# Patient Record
Sex: Male | Born: 1940 | Race: White | Hispanic: No | Marital: Married | Smoking: Former smoker
Health system: Southern US, Community
[De-identification: ages and names within clinical notes are randomized; demographics above are authoritative.]

## PROBLEM LIST (undated history)

## (undated) DIAGNOSIS — R011 Cardiac murmur, unspecified: Secondary | ICD-10-CM

## (undated) DIAGNOSIS — E119 Type 2 diabetes mellitus without complications: Secondary | ICD-10-CM

## (undated) DIAGNOSIS — J45909 Unspecified asthma, uncomplicated: Secondary | ICD-10-CM

## (undated) DIAGNOSIS — H269 Unspecified cataract: Secondary | ICD-10-CM

## (undated) DIAGNOSIS — E785 Hyperlipidemia, unspecified: Secondary | ICD-10-CM

## (undated) DIAGNOSIS — K219 Gastro-esophageal reflux disease without esophagitis: Secondary | ICD-10-CM

## (undated) DIAGNOSIS — I10 Essential (primary) hypertension: Secondary | ICD-10-CM

## (undated) DIAGNOSIS — E7800 Pure hypercholesterolemia, unspecified: Secondary | ICD-10-CM

## (undated) DIAGNOSIS — M109 Gout, unspecified: Secondary | ICD-10-CM

## (undated) DIAGNOSIS — R0989 Other specified symptoms and signs involving the circulatory and respiratory systems: Secondary | ICD-10-CM

## (undated) DIAGNOSIS — M1990 Unspecified osteoarthritis, unspecified site: Secondary | ICD-10-CM

## (undated) DIAGNOSIS — I350 Nonrheumatic aortic (valve) stenosis: Secondary | ICD-10-CM

## (undated) HISTORY — DX: Gastro-esophageal reflux disease without esophagitis: K21.9

## (undated) HISTORY — DX: Type 2 diabetes mellitus without complications: E11.9

## (undated) HISTORY — PX: EYE SURGERY: SHX253

## (undated) HISTORY — PX: POLYPECTOMY: SHX149

## (undated) HISTORY — DX: Gout, unspecified: M10.9

## (undated) HISTORY — PX: COLONOSCOPY: SHX174

## (undated) HISTORY — DX: Unspecified osteoarthritis, unspecified site: M19.90

## (undated) HISTORY — DX: Pure hypercholesterolemia, unspecified: E78.00

## (undated) HISTORY — DX: Cardiac murmur, unspecified: R01.1

## (undated) HISTORY — DX: Other specified symptoms and signs involving the circulatory and respiratory systems: R09.89

## (undated) HISTORY — PX: CARDIAC CATHETERIZATION: SHX172

## (undated) HISTORY — DX: Unspecified asthma, uncomplicated: J45.909

## (undated) HISTORY — DX: Hyperlipidemia, unspecified: E78.5

## (undated) HISTORY — DX: Nonrheumatic aortic (valve) stenosis: I35.0

## (undated) HISTORY — DX: Unspecified cataract: H26.9

## (undated) HISTORY — DX: Essential (primary) hypertension: I10

## (undated) HISTORY — PX: CATARACT EXTRACTION: SUR2

---

## 2005-05-14 ENCOUNTER — Ambulatory Visit: Payer: Self-pay | Admitting: Gastroenterology

## 2006-11-17 ENCOUNTER — Ambulatory Visit (HOSPITAL_BASED_OUTPATIENT_CLINIC_OR_DEPARTMENT_OTHER): Admission: RE | Admit: 2006-11-17 | Discharge: 2006-11-17 | Payer: Self-pay | Admitting: Family Medicine

## 2006-11-22 ENCOUNTER — Ambulatory Visit: Payer: Self-pay | Admitting: Internal Medicine

## 2007-08-10 ENCOUNTER — Ambulatory Visit: Payer: Self-pay | Admitting: Gastroenterology

## 2007-08-25 ENCOUNTER — Ambulatory Visit: Payer: Self-pay | Admitting: Gastroenterology

## 2007-08-25 ENCOUNTER — Encounter: Payer: Self-pay | Admitting: Gastroenterology

## 2007-08-26 ENCOUNTER — Encounter: Payer: Self-pay | Admitting: Gastroenterology

## 2010-08-07 NOTE — Procedures (Signed)
NAME:  Keith Wagner, Keith Wagner NO.:  [id number]   MEDICAL RECORD NO.:  [id number]          PATIENT TYPE:  OUT   LOCATION:  SLEEP CENTER                 FACILITY:  [facility]   PHYSICIAN:  Clinton D. Maple Hudson, MD, FCCP, FACPDATE OF BIRTH:  07-14-1941   DATE OF STUDY:  11/17/2006                            NOCTURNAL POLYSOMNOGRAM   REFERRING PHYSICIAN:  Marjory Lies, M.D.   INDICATION FOR STUDY:  Hypersomnia with sleep apnea.   EPWORTH SLEEPINESS SCORE:  4/24, BMI 30, weight 187 pounds.   HOME MEDICATIONS:  Listed and reviewed.   SLEEP ARCHITECTURE:  Total sleep time 251 minutes with sleep efficiency  59%.  Stage I was 19%, stage II 70%, stage III absent, REM 11% of total  sleep time.  Sleep latency 19 minutes, REM latency 93 minutes.  Awake  after sleep onset 154 minutes including sustained wakefulness between 2-  3 a.m., nonspecific.  Arousal index 5.  No bedtime medication was taken.   RESPIRATORY DATA:  Apnea/hypopnea index (AHI, RDI) no scorable  respiratory events were noted, AHI 0 per hour.   OXYGEN DATA:  Mild to moderate snoring with oxygen desaturation to a  nadir of 88%.  Mean oxygen saturation through the study was 92% on room  air.   CARDIAC DATA:  Sinus rhythm with 1 interval of paroxysmal  supraventricular tachycardia at 160 beats per minute, spontaneously  resolving and apparently asymptomatic while asleep.   MOVEMENT-PARASOMNIA:  Occasional limb jerk with little effect on sleep.  No bathroom trips.   IMPRESSIONS-RECOMMENDATIONS:  1. Short total sleep time without bedtime medication, 251 minutes,      significant for frequent brief wakings and sustained time awake      between 2-3 a.m., nonspecific.  2. No respiratory disturbance, AHI zero per hour (normal range 0-5 per      hour).  Mild to moderate snoring with oxygen desaturating, nadir      88%.  3. One self limited episode of paroxysmal supraventricular tachycardia      at about 160 beats per minute  while in stage II sleep with oxygen      saturation at 92%.      Clinton D. Maple Hudson, MD, [facility], FACP  Diplomate, Biomedical engineer of Sleep Medicine  Electronically Signed     CDY/MEDQ  D:  11/22/2006 15:40:53  T:  11/23/2006 14:10:02  Job:  [id number]

## 2012-09-10 ENCOUNTER — Encounter: Payer: Self-pay | Admitting: Gastroenterology

## 2012-09-14 ENCOUNTER — Encounter: Payer: Self-pay | Admitting: Gastroenterology

## 2012-11-04 ENCOUNTER — Encounter: Payer: Self-pay | Admitting: Gastroenterology

## 2012-11-04 ENCOUNTER — Ambulatory Visit (AMBULATORY_SURGERY_CENTER): Payer: Medicare Other | Admitting: *Deleted

## 2012-11-04 VITALS — Ht 65.0 in | Wt 176.5 lb

## 2012-11-04 DIAGNOSIS — Z86010 Personal history of colonic polyps: Secondary | ICD-10-CM

## 2012-11-04 MED ORDER — PEG-KCL-NACL-NASULF-NA ASC-C 100 G PO SOLR: ORAL | Status: DC

## 2012-11-04 NOTE — Progress Notes (Signed)
No egg or soy allergy.  Pt is on three medicines for his blood pressure.  For this reason, I gave him Moviprep instead of Prepopik

## 2012-11-18 ENCOUNTER — Ambulatory Visit (AMBULATORY_SURGERY_CENTER): Payer: Medicare Other | Admitting: Gastroenterology

## 2012-11-18 ENCOUNTER — Encounter: Payer: Self-pay | Admitting: Gastroenterology

## 2012-11-18 VITALS — BP 147/89 | HR 58 | Resp 16 | Ht 65.0 in | Wt 176.0 lb

## 2012-11-18 DIAGNOSIS — Z86010 Personal history of colonic polyps: Secondary | ICD-10-CM

## 2012-11-18 MED ORDER — SODIUM CHLORIDE 0.9 % IV SOLN: 500.0000 mL | INTRAVENOUS | Status: DC

## 2012-11-18 NOTE — Patient Instructions (Signed)
YOU HAD AN ENDOSCOPIC PROCEDURE TODAY AT THE [facility]: Refer to the procedure report that was given to you for any specific questions about what was found during the examination.  If the procedure report does not answer your questions, please call your gastroenterologist to clarify.  If you requested that your care partner not be given the details of your procedure findings, then the procedure report has been included in a sealed envelope for you to review at your convenience later.  YOU SHOULD EXPECT: Some feelings of bloating in the abdomen. Passage of more gas than usual.  Walking can help get rid of the air that was put into your GI tract during the procedure and reduce the bloating. If you had a lower endoscopy (such as a colonoscopy or flexible sigmoidoscopy) you may notice spotting of blood in your stool or on the toilet paper. If you underwent a bowel prep for your procedure, then you may not have a normal bowel movement for a few days.  DIET: Your first meal following the procedure should be a light meal and then it is ok to progress to your normal diet.  A half-sandwich or bowl of soup is an example of a good first meal.  Heavy or fried foods are harder to digest and may make you feel nauseous or bloated.  Likewise meals heavy in dairy and vegetables can cause extra gas to form and this can also increase the bloating.  Drink plenty of fluids but you should avoid alcoholic beverages for 24 hours.  ACTIVITY: Your care partner should take you home directly after the procedure.  You should plan to take it easy, moving slowly for the rest of the day.  You can resume normal activity the day after the procedure however you should NOT DRIVE or use heavy machinery for 24 hours (because of the sedation medicines used during the test).    SYMPTOMS TO REPORT IMMEDIATELY: A gastroenterologist can be reached at any hour.  During normal business hours, 8:30 AM to 5:00 PM Monday through Friday,  call [phone].  After hours and on weekends, please call the GI answering service at [phone] who will take a message and have the physician on call contact you.   Following lower endoscopy (colonoscopy or flexible sigmoidoscopy):  Excessive amounts of blood in the stool  Significant tenderness or worsening of abdominal pains  Swelling of the abdomen that is new, acute  Fever of 100F or higher    FOLLOW UP: If any biopsies were taken you will be contacted by phone or by letter within the next 1-3 weeks.  Call your gastroenterologist if you have not heard about the biopsies in 3 weeks.  Our staff will call the home number listed on your records the next business day following your procedure to check on you and address any questions or concerns that you may have at that time regarding the information given to you following your procedure. This is a courtesy call and so if there is no answer at the home number and we have not heard from you through the emergency physician on call, we will assume that you have returned to your regular daily activities without incident.  SIGNATURES/CONFIDENTIALITY: You and/or your care partner have signed paperwork which will be entered into your electronic medical record.  These signatures attest to the fact that that the information above on your After Visit Summary has been reviewed and is understood.  Full responsibility of the confidentiality   of this discharge information lies with you and/or your care-partner.     

## 2012-11-18 NOTE — Progress Notes (Signed)
Patient did not experience any of the following events: a burn prior to discharge; a fall within the facility; wrong site/side/patient/procedure/implant event; or a hospital transfer or hospital admission upon discharge from the facility. (G8907) Patient did not have preoperative order for IV antibiotic SSI prophylaxis. (G8918)  

## 2012-11-18 NOTE — Progress Notes (Signed)
Lidocaine-40mg IV prior to Propofol InductionPropofol given over incremental dosages 

## 2012-11-18 NOTE — Op Note (Signed)
[facility] [address] [place], [address]   COLONOSCOPY PROCEDURE REPORT  PATIENT: Keith Wagner, Keith Wagner  MR#: [id number] BIRTHDATE: Jun 15, 1940 , 72  yrs. old GENDER: Male ENDOSCOPIST: Meryl Dare, MD, [facility] PROCEDURE DATE:  11/18/2012 PROCEDURE:   Colonoscopy, screening First Screening Colonoscopy - Avg.  risk and is 50 yrs.  old or older - No.  Prior Negative Screening - Now for repeat screening. N/A  History of Adenoma - Now for follow-up colonoscopy & has been > or = to 3 yrs.  Yes hx of adenoma.  Has been 3 or more years since last colonoscopy.  Polyps Removed Today? No.  Recommend repeat exam, <10 yrs? Yes.  High risk (family or personal hx). ASA CLASS:   Class II INDICATIONS:Patient's personal history of adenomatous colon polyps.  MEDICATIONS: MAC sedation, administered by CRNA and propofol (Diprivan) 250mg  IV DESCRIPTION OF PROCEDURE:   After the risks benefits and alternatives of the procedure were thoroughly explained, informed consent was obtained.  A digital rectal exam revealed no abnormalities of the rectum.   The LB PFC-H190 [id number]  endoscope was introduced through the anus and advanced to the cecum, which was identified by both the appendix and ileocecal valve. No adverse events experienced.   The quality of the prep was good, using MoviPrep  The instrument was then slowly withdrawn as the colon was fully examined.  COLON FINDINGS: A normal appearing cecum, ileocecal valve, and appendiceal orifice were identified.  The ascending, hepatic flexure, transverse, splenic flexure, descending, sigmoid colon and rectum appeared unremarkable.  No polyps or cancers were seen. Retroflexed views revealed small internal hemorrhoids. The time to cecum=1 minutes 15 seconds.  Withdrawal time=9 minutes 25 seconds. The scope was withdrawn and the procedure completed.  COMPLICATIONS: There were no complications.  ENDOSCOPIC IMPRESSION: 1.  Normal colon 2.   Small internal hemorrhodids  RECOMMENDATIONS: 1.  Repeat Colonoscopy in 5 years.  eSigned:  Meryl Dare, MD, [facility] 11/18/2012 9:39 AM   cc: Marjory Lies, MD

## 2012-11-19 ENCOUNTER — Telehealth: Payer: Self-pay | Admitting: *Deleted

## 2012-11-19 NOTE — Telephone Encounter (Signed)
  Follow up Call-  Call back number 11/18/2012  Post procedure Call Back phone  # [phone]  Permission to leave phone message Yes     Patient questions:  Do you have a fever, pain , or abdominal swelling? no Pain Score  0 *  Have you tolerated food without any problems? yes  Have you been able to return to your normal activities? yes  Do you have any questions about your discharge instructions: Diet   no Medications  no Follow up visit  no  Do you have questions or concerns about your Care? no  Actions: * If pain score is 4 or above: No action needed, pain <4.

## 2014-11-07 DIAGNOSIS — I10 Essential (primary) hypertension: Secondary | ICD-10-CM | POA: Insufficient documentation

## 2015-07-24 ENCOUNTER — Encounter: Payer: Self-pay | Admitting: Gastroenterology

## 2017-09-15 ENCOUNTER — Encounter: Payer: Self-pay | Admitting: Gastroenterology

## 2017-11-14 ENCOUNTER — Encounter: Payer: Self-pay | Admitting: Gastroenterology

## 2017-12-30 ENCOUNTER — Encounter: Payer: Self-pay | Admitting: *Deleted

## 2017-12-30 ENCOUNTER — Ambulatory Visit (AMBULATORY_SURGERY_CENTER): Payer: Self-pay | Admitting: *Deleted

## 2017-12-30 ENCOUNTER — Encounter: Payer: Self-pay | Admitting: Gastroenterology

## 2017-12-30 VITALS — Ht 66.0 in | Wt 185.4 lb

## 2017-12-30 DIAGNOSIS — Z86010 Personal history of colonic polyps: Secondary | ICD-10-CM

## 2017-12-30 MED ORDER — NA SULFATE-K SULFATE-MG SULF 17.5-3.13-1.6 GM/177ML PO SOLN: 1.0000 | Freq: Once | ORAL | 0 refills | Status: AC

## 2017-12-30 NOTE — Progress Notes (Signed)
No egg or soy allergy known to patient  No issues with past sedation with any surgeries  or procedures, no past  Intubation  No diet pills per patient No home 02 use per patient  No blood thinners per patient  Pt denies issues with constipation  No A fib or A flutter  EMMI video sent to pt's e mail - pt has viewed in the past  Wife in [place] with pt today

## 2018-01-13 ENCOUNTER — Encounter: Payer: Self-pay | Admitting: Gastroenterology

## 2018-01-13 ENCOUNTER — Ambulatory Visit (AMBULATORY_SURGERY_CENTER): Payer: Medicare HMO | Admitting: Gastroenterology

## 2018-01-13 VITALS — BP 120/75 | HR 80 | Temp 96.9°F | Resp 14 | Ht 66.0 in | Wt 185.0 lb

## 2018-01-13 DIAGNOSIS — Z86010 Personal history of colonic polyps: Secondary | ICD-10-CM

## 2018-01-13 DIAGNOSIS — K635 Polyp of colon: Secondary | ICD-10-CM | POA: Diagnosis not present

## 2018-01-13 DIAGNOSIS — K6389 Other specified diseases of intestine: Secondary | ICD-10-CM

## 2018-01-13 DIAGNOSIS — D125 Benign neoplasm of sigmoid colon: Secondary | ICD-10-CM

## 2018-01-13 MED ORDER — SODIUM CHLORIDE 0.9 % IV SOLN: 500.0000 mL | Freq: Once | INTRAVENOUS | Status: DC

## 2018-01-13 NOTE — Op Note (Addendum)
[place] Patient Name: Keith Wagner Procedure Date: 01/13/2018 9:12 AM MRN: [id number] Endoscopist: Ladene Artist , MD Age: 77 Referring MD:  Date of Birth: 11/22/1940 Gender: Male Account #: [id number] Procedure:                Colonoscopy Indications:              Surveillance: Personal history of adenomatous                            polyps on last colonoscopy 5 years ago Medicines:                Monitored Anesthesia Care Procedure:                Pre-Anesthesia Assessment:                           - Prior to the procedure, a History and Physical                            was performed, and patient medications and                            allergies were reviewed. The patient's tolerance of                            previous anesthesia was also reviewed. The risks                            and benefits of the procedure and the sedation                            options and risks were discussed with the patient.                            All questions were answered, and informed consent                            was obtained. Prior Anticoagulants: The patient has                            taken no previous anticoagulant or antiplatelet                            agents. ASA Grade Assessment: II - A patient with                            mild systemic disease. After reviewing the risks                            and benefits, the patient was deemed in                            satisfactory condition to undergo the procedure.  After obtaining informed consent, the colonoscope                            was passed under direct vision. Throughout the                            procedure, the patient's blood pressure, pulse, and                            oxygen saturations were monitored continuously. The                            Colonoscope was introduced through the anus and                            advanced to the the cecum,  identified by                            appendiceal orifice and ileocecal valve. The                            ileocecal valve, appendiceal orifice, and rectum                            were photographed. The quality of the bowel                            preparation was excellent. The colonoscopy was                            performed without difficulty. The patient tolerated                            the procedure well. Scope In: 9:16:47 AM Scope Out: 9:27:07 AM Scope Withdrawal Time: 0 hours 9 minutes 5 seconds  Total Procedure Duration: 0 hours 10 minutes 20 seconds  Findings:                 The perianal and digital rectal examinations were                            normal.                           A 8 mm polyp was found in the sigmoid colon. The                            polyp was sessile. The polyp was removed with a                            cold snare. Resection and retrieval were complete.                           Internal hemorrhoids were found during  retroflexion. The hemorrhoids were medium-sized and                            Grade I (internal hemorrhoids that do not prolapse).                           The exam was otherwise without abnormality on                            direct and retroflexion views. Complications:            No immediate complications. Estimated blood loss:                            None. Estimated Blood Loss:     Estimated blood loss: none. Impression:               - One 8 mm polyp in the sigmoid colon, removed with                            a cold snare. Resected and retrieved.                           - Internal hemorrhoids.                           - The examination was otherwise normal on direct                            and retroflexion views. Recommendation:           - Patient has a contact number available for                            emergencies. The signs and symptoms of potential                             delayed complications were discussed with the                            patient. Return to normal activities tomorrow.                            Written discharge instructions were provided to the                            patient.                           - Resume previous diet.                           - Continue present medications.                           - Await pathology results.                           -  No repeat colonoscopy due to age. Ladene Artist, MD 01/13/2018 9:29:19 AM This report has been signed electronically.

## 2018-01-13 NOTE — Progress Notes (Signed)
To PACU, VSS. REport to [email or web address].tb 

## 2018-01-13 NOTE — Progress Notes (Signed)
Pt's states no medical or surgical changes since previsit or office visit. 

## 2018-01-13 NOTE — Patient Instructions (Signed)
HANDOUTS GIVEN FOR POLYPS AND HEMORRHOIDS  YOU HAD AN ENDOSCOPIC PROCEDURE TODAY AT THE [facility]:   Refer to the procedure report that was given to you for any specific questions about what was found during the examination.  If the procedure report does not answer your questions, please call your gastroenterologist to clarify.  If you requested that your care partner not be given the details of your procedure findings, then the procedure report has been included in a sealed envelope for you to review at your convenience later.  YOU SHOULD EXPECT: Some feelings of bloating in the abdomen. Passage of more gas than usual.  Walking can help get rid of the air that was put into your GI tract during the procedure and reduce the bloating. If you had a lower endoscopy (such as a colonoscopy or flexible sigmoidoscopy) you may notice spotting of blood in your stool or on the toilet paper. If you underwent a bowel prep for your procedure, you may not have a normal bowel movement for a few days.  Please Note:  You might notice some irritation and congestion in your nose or some drainage.  This is from the oxygen used during your procedure.  There is no need for concern and it should clear up in a day or so.  SYMPTOMS TO REPORT IMMEDIATELY:   Following lower endoscopy (colonoscopy or flexible sigmoidoscopy):  Excessive amounts of blood in the stool  Significant tenderness or worsening of abdominal pains  Swelling of the abdomen that is new, acute  Fever of 100F or higher   For urgent or emergent issues, a gastroenterologist can be reached at any hour by calling [phone].   DIET:  We do recommend a small meal at first, but then you may proceed to your regular diet.  Drink plenty of fluids but you should avoid alcoholic beverages for 24 hours.  ACTIVITY:  You should plan to take it easy for the rest of today and you should NOT DRIVE or use heavy machinery until tomorrow (because of the  sedation medicines used during the test).    FOLLOW UP: Our staff will call the number listed on your records the next business day following your procedure to check on you and address any questions or concerns that you may have regarding the information given to you following your procedure. If we do not reach you, we will leave a message.  However, if you are feeling well and you are not experiencing any problems, there is no need to return our call.  We will assume that you have returned to your regular daily activities without incident.  If any biopsies were taken you will be contacted by phone or by letter within the next 1-3 weeks.  Please call us at [phone] if you have not heard about the biopsies in 3 weeks.    SIGNATURES/CONFIDENTIALITY: You and/or your care partner have signed paperwork which will be entered into your electronic medical record.  These signatures attest to the fact that that the information above on your After Visit Summary has been reviewed and is understood.  Full responsibility of the confidentiality of this discharge information lies with you and/or your care-partner. 

## 2018-01-13 NOTE — Progress Notes (Signed)
Called to room to assist during endoscopic procedure.  Patient ID and intended procedure confirmed with present staff. Received instructions for my participation in the procedure from the performing physician.  

## 2018-01-14 ENCOUNTER — Telehealth: Payer: Self-pay

## 2018-01-14 NOTE — Telephone Encounter (Signed)
  Follow up Call-  Call back number 01/13/2018  Post procedure Call Back phone  # [phone]  Permission to leave phone message Yes  Some recent data might be hidden     Patient questions:  Do you have a fever, pain , or abdominal swelling? No. Pain Score  0 *  Have you tolerated food without any problems? Yes   Have you been able to return to your normal activities? Yes.    Do you have any questions about your discharge instructions: Diet   No. Medications  No. Follow up visit  No.  Do you have questions or concerns about your Care? No.  Actions: * If pain score is 4 or above: No action needed, pain <4.

## 2018-01-30 ENCOUNTER — Encounter: Payer: Self-pay | Admitting: Gastroenterology

## 2018-05-24 ENCOUNTER — Other Ambulatory Visit: Payer: Self-pay | Admitting: Cardiology

## 2018-06-11 NOTE — Progress Notes (Signed)
Subjective:   Keith Wagner, male    DOB: Nov 08, 1940, 78 y.o.   MRN: [id number]  Keith Wagner., PA-C:  Chief Complaint  Patient presents with  . Hypertension  . Hyperlipidemia  . Follow-up    HPI: Keith Wagner  is a 78 y.o. male  with mild to moderate AR, trace AS, mild MR and mild to moderate TR with mild pulmonary hypertension by echocardiogram in July 2018. Patient also has hypertension with heart disease, diabetes, and hyperlipidemia.  He is here for follow up. Reports blood pressure has been elevated for the last few days. He denies any complaints of chest pain, tightness or pressure. There is no shortness of breath, orthopnea or PND. He has occasional mild swelling in his right foot and right knee from OA. No history of leg claudication.   He has not had any recent episodes of palpitations.  He does not smoke. He does use a stationary bicycle and also walks regularly that he tolerates well. He is also is very active with gardening.  No history of thyroid problems. No history of rheumatic fever. No history of TIA or CVA.     Past Medical History:  Diagnosis Date  . Arthritis   . Asthma    uses inhaler PRN  . Bilateral carotid bruits 06/12/2018  . Cataract   . Diabetes mellitus without complication ([place])    per pt "pre-diabetic"- off Metformin- diet controlled   . GERD (gastroesophageal reflux disease)   . Gout   . Heart murmur   . Hypercholesteremia 06/12/2018  . Hyperlipidemia   . Hypertension   . Mild aortic stenosis 06/12/2018    Past Surgical History:  Procedure Laterality Date  . CATARACT EXTRACTION Left   . COLONOSCOPY    . POLYPECTOMY      Family History  Problem Relation Age of Onset  . Colon cancer Neg Hx   . Esophageal cancer Neg Hx   . Stomach cancer Neg Hx   . Rectal cancer Neg Hx   . Colon polyps Neg Hx     Social History   Socioeconomic History  . Marital status: Married    Spouse name: Not on file  . Number of  children: 0  . Years of education: Not on file  . Highest education level: Not on file  Occupational History  . Not on file  Social Needs  . Financial resource strain: Not on file  . Food insecurity:    Worry: Not on file    Inability: Not on file  . Transportation needs:    Medical: Not on file    Non-medical: Not on file  Tobacco Use  . Smoking status: Former Smoker    Types: Cigars  . Smokeless tobacco: Never Used  Substance and Sexual Activity  . Alcohol use: No  . Drug use: No  . Sexual activity: Not on file  Lifestyle  . Physical activity:    Days per week: Not on file    Minutes per session: Not on file  . Stress: Not on file  Relationships  . Social connections:    Talks on phone: Not on file    Gets together: Not on file    Attends religious service: Not on file    Active member of club or organization: Not on file    Attends meetings of clubs or organizations: Not on file    Relationship status: Not on file  . Intimate partner violence:  Fear of current or ex partner: Not on file    Emotionally abused: Not on file    Physically abused: Not on file    Forced sexual activity: Not on file  Other Topics Concern  . Not on file  Social History Narrative  . Not on file    Current Meds  Medication Sig  . albuterol (PROVENTIL) (2.5 MG/3ML) 0.083% nebulizer solution Take 2.5 mg by nebulization as needed for wheezing.  Marland Kitchen amLODipine (NORVASC) 5 MG tablet Take 10 mg by mouth daily.   Marland Kitchen aspirin 81 MG tablet Take 81 mg by mouth daily.  Marland Kitchen atorvastatin (LIPITOR) 20 MG tablet Take 40 mg by mouth daily.   . furosemide (LASIX) 20 MG tablet Take 20 mg by mouth.  . GuaiFENesin (MUCINEX PO) Take 2 tablets by mouth daily as needed.  . hydrALAZINE (APRESOLINE) 25 MG tablet Take 1 tablet by mouth twice daily  . lisinopril (PRINIVIL,ZESTRIL) 20 MG tablet Take 40 mg by mouth daily.   . LUTEIN PO Take by mouth daily.  . Multiple Vitamins-Minerals (MULTIVITAMIN PO) Take by mouth  daily.  . naproxen (NAPROSYN) 500 MG tablet TAKE ONE TABLET BY MOUTH EVERY 12 HOURS AS NEEDED (REPLACING INDOMETHACIN)  . Omega-3 Fatty Acids (OMEGA 3 PO) Take 1,000 mg by mouth. 4 tablets daily  . OVER THE COUNTER MEDICATION Take by mouth. Super Beta Prostate     Review of Systems  Constitution: Negative for decreased appetite, malaise/fatigue, weight gain and weight loss.  Eyes: Negative for visual disturbance.  Cardiovascular: Negative for chest pain, claudication, dyspnea on exertion, leg swelling, orthopnea, palpitations and syncope.  Respiratory: Negative for hemoptysis and wheezing.   Endocrine: Negative for cold intolerance and heat intolerance.  Hematologic/Lymphatic: Does not bruise/bleed easily.  Skin: Negative for nail changes.  Musculoskeletal: Negative for muscle weakness and myalgias.  Gastrointestinal: Negative for abdominal pain, change in bowel habit, nausea and vomiting.  Neurological: Negative for difficulty with concentration, dizziness, focal weakness and headaches.  Psychiatric/Behavioral: Negative for altered mental status and suicidal ideas.  All other systems reviewed and are negative.      Objective:     Blood pressure (!) 172/95, pulse 85, height '5\' 6"'$  (1.676 m), weight 187 lb (84.8 kg), SpO2 97 %.  Cardiac studies:  EKG 06/12/2018: Normal sinus rhythm at 77 bpm, leftward axis, no evidence of ischemia.   Carotid artery duplex 12/04/2017: Minimal stenosis in the right internal carotid artery (1-15%). Minimal stenosis in the left internal carotid artery (1-15%). Antegrade right vertebral artery flow. Antegrade left vertebral artery flow. Follow up when clinically indicated.  Echocardiogram 12/04/2017: Left ventricle cavity is normal in size. Moderate concentric hypertrophy of the left ventricle. Normal global wall motion. Doppler evidence of grade I (impaired) diastolic dysfunction, normal LAP. Calculated EF 56%. Mild calcification of the aortic valve  annulus and leaflets.  Mild aortic valve stenosis. Aortic valve mean gradient of 16 mmHg, Vmax of 2.6 m/s. Calculated aortic valve area by continuity equation is 2.5 cm. Mild (Grade I) aortic regurgitation. Mild to moderate tricuspid regurgitation. Estimated pulmonary artery systolic pressure 33  mmHg. No significant change compared to prior study on 10/02/2016.  Treadmill Exercise stress 11/11/2014: Indication: Diabetes, HTN The patient exercised according to [facility] Protocol, Total time recorded  6:00 min achieving max heart rate of  148 which was  100% of MPHR for age and  7.05 METS of work. Occasional PVC during exercise.  Normal BP response. Resting ECG showing NSR. There was no ST-T changes  of ischemia with exercise stress test. No exercise induced arrythmias noted. Stress terminated due to [facility] met.  Sleep Study 2011: Neg for Sleep Apnea  Recent Labs:  04/30/2018: Cholesterol 161, triglycerides 286, HDL 44, LDL 90. K 3.2, Creatinine 0.96, ALT 66, CMP otherwise normal. CBC normal.  11/09/2017: Hemoglobin A1c 5.8%.  Potassium 4.2, creatinine 1.03, ALT minimally elevated at 56, CMP otherwise normal.  RBC mildly decreased at 4.69, CBC otherwise normal.  Cholesterol 153, triglycerides 206, HDL 38, LDL 93.   Physical Exam  Constitutional: He appears well-developed and well-nourished. No distress.  HENT:  Head: Atraumatic.  Eyes: Conjunctivae are normal.  Neck: Neck supple. No JVD present. No thyromegaly present.  Cardiovascular: Normal rate, regular rhythm and intact distal pulses. Exam reveals no gallop.  Murmur heard.  Systolic murmur is present with a grade of 3/6. Grade 3/6 ESM is audible in the aortic, left parasternal area and apex  Diastolic murmur is present. Soft EDM is audible in LPSA. Pulses:      Carotid pulses are on the right side with bruit and on the left side with bruit. Pulmonary/Chest: Effort normal and breath sounds normal.  Abdominal: Soft. Bowel sounds are normal.   Musculoskeletal: Normal range of motion.        General: No edema.  Neurological: He is alert.  Skin: Skin is warm and dry.  Psychiatric: He has a normal mood and affect.  Vitals reviewed.          Assessment & Recommendations:  1. Mild aortic stenosis Has remained unchanged by last echo in Sept 2019. No changes noted by physical exam and remains asymptomatic. Do not feel that he needs repeat echocardiogram at this time. Will continue with clinical surveillance.  2. Hypertension with heart disease Generally well controlled. Was initially elevated in our office in 002'B systolic, but improved to upper 130's on recheck. He will continue to monitor at home and notify me if persistently elevated. He understands goal of 130/80 or less. Encouraged continued diet and exercise.  3. Bilateral carotid bruits Had minimal bilateral stenosis on previous carotid duplex. No changes noted on exam and remains asymptomatic. Continue with ASA and statin.   4. Hypercholesteremia On lipitor 62m daily. Lipids have been stable.   Plan: I will see him back in 6 months for follow up on hypertension and aortic stenosis.   AJeri Lager MSN, APRN, FNP-C [facility] Center[facility], PSebringOffice: [phone]: [phone]

## 2018-06-12 ENCOUNTER — Encounter: Payer: Self-pay | Admitting: Cardiology

## 2018-06-12 ENCOUNTER — Other Ambulatory Visit: Payer: Self-pay

## 2018-06-12 ENCOUNTER — Ambulatory Visit: Payer: Medicare HMO | Admitting: Cardiology

## 2018-06-12 VITALS — BP 138/88 | HR 85 | Ht 66.0 in | Wt 187.0 lb

## 2018-06-12 DIAGNOSIS — I350 Nonrheumatic aortic (valve) stenosis: Secondary | ICD-10-CM | POA: Diagnosis not present

## 2018-06-12 DIAGNOSIS — E7800 Pure hypercholesterolemia, unspecified: Secondary | ICD-10-CM | POA: Diagnosis not present

## 2018-06-12 DIAGNOSIS — I119 Hypertensive heart disease without heart failure: Secondary | ICD-10-CM

## 2018-06-12 DIAGNOSIS — Z0189 Encounter for other specified special examinations: Secondary | ICD-10-CM | POA: Insufficient documentation

## 2018-06-12 DIAGNOSIS — R0989 Other specified symptoms and signs involving the circulatory and respiratory systems: Secondary | ICD-10-CM | POA: Diagnosis not present

## 2018-06-12 HISTORY — DX: Nonrheumatic aortic (valve) stenosis: I35.0

## 2018-06-12 HISTORY — DX: Other specified symptoms and signs involving the circulatory and respiratory systems: R09.89

## 2018-06-12 HISTORY — DX: Pure hypercholesterolemia, unspecified: E78.00

## 2018-06-12 MED ORDER — HYDRALAZINE HCL 25 MG PO TABS: 25.0000 mg | ORAL_TABLET | Freq: Two times a day (BID) | ORAL | 4 refills | Status: DC

## 2018-06-15 ENCOUNTER — Other Ambulatory Visit: Payer: Self-pay

## 2018-06-15 DIAGNOSIS — I10 Essential (primary) hypertension: Secondary | ICD-10-CM

## 2018-06-15 MED ORDER — HYDRALAZINE HCL 25 MG PO TABS: 25.0000 mg | ORAL_TABLET | Freq: Two times a day (BID) | ORAL | 4 refills | Status: DC

## 2018-12-14 ENCOUNTER — Encounter: Payer: Self-pay | Admitting: Cardiology

## 2018-12-14 ENCOUNTER — Ambulatory Visit (INDEPENDENT_AMBULATORY_CARE_PROVIDER_SITE_OTHER): Payer: Medicare HMO | Admitting: Cardiology

## 2018-12-14 ENCOUNTER — Other Ambulatory Visit: Payer: Self-pay

## 2018-12-14 VITALS — BP 132/84 | HR 79 | Temp 96.8°F | Ht 66.0 in | Wt 177.0 lb

## 2018-12-14 DIAGNOSIS — E7800 Pure hypercholesterolemia, unspecified: Secondary | ICD-10-CM

## 2018-12-14 DIAGNOSIS — I350 Nonrheumatic aortic (valve) stenosis: Secondary | ICD-10-CM | POA: Diagnosis not present

## 2018-12-14 DIAGNOSIS — I119 Hypertensive heart disease without heart failure: Secondary | ICD-10-CM

## 2018-12-14 NOTE — Progress Notes (Signed)
Primary Physician:  Aletha Halim., PA-C   Patient ID: Keith Wagner, male    DOB: [birth year], 78 y.o.   MRN: [id number]  Subjective:    Chief Complaint  Patient presents with  . Aortic Stenosis    HPI: Keith Wagner  is a 78 y.o. male  with mild to moderate AR, trace AS, mild MR and mild to moderate TR with mild pulmonary hypertension by echocardiogram in July 2018. Patient also has hypertension with heart disease, diabetes, and hyperlipidemia.  Patient is here for 69-monthoffice visit and follow-up.  Presently doing well without any complaints.  He has been making diet changes and reports losing weight.  His goal is to obtain weight 165 pounds. He denies any complaints of chest pain, tightness or pressure. There is no shortness of breath, orthopnea or PND. He has occasional mild swelling in his right foot and right knee from OA. No history of leg claudication.   He has not had any recent episodes of palpitations.  He does not smoke. He does use a stationary bicycle and also walks regularly that he tolerates well. He is also is very active with gardening.  No history of thyroid problems. No history of rheumatic fever. No history of TIA or CVA.  Past Medical History:  Diagnosis Date  . Arthritis   . Asthma    uses inhaler PRN  . Bilateral carotid bruits 06/12/2018  . Cataract   . Diabetes mellitus without complication ([place]    per pt "pre-diabetic"- off Metformin- diet controlled   . GERD (gastroesophageal reflux disease)   . Gout   . Heart murmur   . Hypercholesteremia 06/12/2018  . Hyperlipidemia   . Hypertension   . Mild aortic stenosis 06/12/2018    Past Surgical History:  Procedure Laterality Date  . CATARACT EXTRACTION Left   . COLONOSCOPY    . POLYPECTOMY      Social History   Socioeconomic History  . Marital status: Married    Spouse name: Not on file  . Number of children: 0  . Years of education: Not on file  . Highest education level:  Not on file  Occupational History  . Not on file  Social Needs  . Financial resource strain: Not on file  . Food insecurity    Worry: Not on file    Inability: Not on file  . Transportation needs    Medical: Not on file    Non-medical: Not on file  Tobacco Use  . Smoking status: Former Smoker    Types: Cigars  . Smokeless tobacco: Never Used  Substance and Sexual Activity  . Alcohol use: No  . Drug use: No  . Sexual activity: Not on file  Lifestyle  . Physical activity    Days per week: Not on file    Minutes per session: Not on file  . Stress: Not on file  Relationships  . Social [place] phone: Not on file    Gets together: Not on file    Attends religious service: Not on file    Active member of club or organization: Not on file    Attends meetings of clubs or organizations: Not on file    Relationship status: Not on file  . Intimate partner violence    Fear of current or ex partner: Not on file    Emotionally abused: Not on file    Physically abused: Not on file    Forced sexual activity:  Not on file  Other Topics Concern  . Not on file  Social History Narrative  . Not on file    Review of Systems  Constitution: Negative for decreased appetite, malaise/fatigue, weight gain and weight loss.  Eyes: Negative for visual disturbance.  Cardiovascular: Negative for chest pain, claudication, dyspnea on exertion, leg swelling, orthopnea, palpitations and syncope.  Respiratory: Negative for hemoptysis and wheezing.   Endocrine: Negative for cold intolerance and heat intolerance.  Hematologic/Lymphatic: Does not bruise/bleed easily.  Skin: Negative for nail changes.  Musculoskeletal: Negative for muscle weakness and myalgias.  Gastrointestinal: Negative for abdominal pain, change in bowel habit, nausea and vomiting.  Neurological: Negative for difficulty with concentration, dizziness, focal weakness and headaches.  Psychiatric/Behavioral: Negative for  altered mental status and suicidal ideas.  All other systems reviewed and are negative.     Objective:  Blood pressure 132/84, pulse 79, temperature (!) 96.8 F (36 C), height '5\' 6"'$  (1.676 m), weight 177 lb (80.3 kg), SpO2 96 %. Body mass index is 28.57 kg/m.    Physical Exam  Constitutional: He appears well-developed and well-nourished. No distress.  HENT:  Head: Atraumatic.  Eyes: Conjunctivae are normal.  Neck: Neck supple. No JVD present. No thyromegaly present.  Cardiovascular: Normal rate, regular rhythm and intact distal pulses. Exam reveals no gallop.  Murmur heard.  Systolic murmur is present with a grade of 3/6. Grade 3/6 ESM is audible in the aortic, left parasternal area and apex  Diastolic murmur is present. Soft EDM is audible in LPSA. Pulses:      Carotid pulses are on the right side with bruit and on the left side with bruit. Pulmonary/Chest: Effort normal and breath sounds normal.  Abdominal: Soft. Bowel sounds are normal.  Musculoskeletal: Normal range of motion.        General: No edema.  Neurological: He is alert.  Skin: Skin is warm and dry.  Psychiatric: He has a normal mood and affect.  Vitals reviewed.  Radiology: No results found.  Laboratory examination:   04/30/2018: Cholesterol 161, triglycerides 286, HDL 44, LDL 90. K 3.2, Creatinine 0.96, ALT 66, CMP otherwise normal. CBC normal.  11/09/2017: Hemoglobin A1c 5.8%.  Potassium 4.2, creatinine 1.03, ALT minimally elevated at 56, CMP otherwise normal.  RBC mildly decreased at 4.69, CBC otherwise normal.  Cholesterol 153, triglycerides 206, HDL 38, LDL 93.  No flowsheet data found. No flowsheet data found. Lipid Panel  No results found for: CHOL, TRIG, HDL, CHOLHDL, VLDL, LDLCALC, LDLDIRECT HEMOGLOBIN A1C No results found for: HGBA1C, MPG TSH No results for input(s): TSH in the last 8760 hours.  PRN Meds:. There are no discontinued medications. Current Meds  Medication Sig  . albuterol  (PROVENTIL) (2.5 MG/3ML) 0.083% nebulizer solution Take 2.5 mg by nebulization as needed for wheezing.  Marland Kitchen amLODipine (NORVASC) 5 MG tablet Take 10 mg by mouth daily.   Marland Kitchen aspirin 81 MG tablet Take 81 mg by mouth daily.  Marland Kitchen atorvastatin (LIPITOR) 20 MG tablet Take 40 mg by mouth daily.   . furosemide (LASIX) 20 MG tablet Take 20 mg by mouth.  . GuaiFENesin (MUCINEX PO) Take 2 tablets by mouth daily as needed.  . hydrALAZINE (APRESOLINE) 25 MG tablet Take 1 tablet (25 mg total) by mouth 2 (two) times daily.  . indomethacin (INDOCIN) 25 MG capsule Take 25 mg by mouth 2 (two) times daily with a meal.  . lisinopril (PRINIVIL,ZESTRIL) 20 MG tablet Take 40 mg by mouth daily.   . LUTEIN PO  Take by mouth daily.  . Multiple Vitamins-Minerals (MULTIVITAMIN PO) Take by mouth daily.  . naproxen (NAPROSYN) 500 MG tablet TAKE ONE TABLET BY MOUTH EVERY 12 HOURS AS NEEDED (REPLACING INDOMETHACIN)  . Omega-3 Fatty Acids (OMEGA 3 PO) Take 1,000 mg by mouth. 4 tablets daily  . OVER THE COUNTER MEDICATION Take by mouth. Super Beta Prostate    Cardiac Studies:   Carotid artery duplex 01/02/18: Minimal stenosis in the right internal carotid artery (1-15%). Minimal stenosis in the left internal carotid artery (1-15%). Antegrade right vertebral artery flow. Antegrade left vertebral artery flow. Follow up when clinically indicated.  Echocardiogram January 02, 2018: Left ventricle cavity is normal in size. Moderate concentric hypertrophy of the left ventricle. Normal global wall motion. Doppler evidence of grade I (impaired) diastolic dysfunction, normal LAP. Calculated EF 56%. Mild calcification of the aortic valve annulus and leaflets.  Mild aortic valve stenosis. Aortic valve mean gradient of 16 mmHg, Vmax of 2.6 m/s. Calculated aortic valve area by continuity equation is 2.5 cm. Mild (Grade I) aortic regurgitation. Mild to moderate tricuspid regurgitation. Estimated pulmonary artery systolic pressure 33  mmHg. No  significant change compared to prior study on 10/02/2016.  Treadmill Exercise stress 11/11/2014: Indication: Diabetes, HTN The patient exercised according to [facility] Protocol, Total time recorded  6:00 min achieving max heart rate of  148 which was  100% of MPHR for age and  7.05 METS of work. Occasional PVC during exercise.  Normal BP response. Resting ECG showing NSR. There was no ST-T changes of ischemia with exercise stress test. No exercise induced arrythmias noted. Stress terminated due to [facility] met.  Sleep Study 2011: Neg for Sleep Apnea  Assessment:   Mild aortic stenosis - Plan: EKG 12-Lead, PCV ECHOCARDIOGRAM COMPLETE  Hypertension with heart disease  Hypercholesteremia  EKG 12/14/2018: Normal sinus rhythm at 65 bpm with first-degree AV block, leftward axis, poor R-wave progression.  Nonspecific ST abnormality.    Recommendations:   Patient is presently doing well without any complaints today.  He has been making diet changes and exercising regularly and has lost 10 pounds since last seen by me, congratulated him on this throughout' depression.  By clinical exam, aortic stenosis has remained stable.  He will need repeat echocardiogram for surveillance as it is been greater than 1 year.  Blood pressure was initially elevated in our office, but on recheck had significantly improved.  Continue with home monitoring and current medications.  No changes were made.  He is to see his PCP next month and will have labs performed at that time, will send copies to [place] at that time for records.  He is on statin for hyperlipidemia. Overall, patient is stable from cardiac standpoint.  I will see him back in 6 months, unless marked abnormalities on echocardiogram, will see him sooner.  Miquel Dunn, MSN, APRN, FNP-C [facility]. [place] Office: [phone] Fax: [phone]

## 2019-05-25 ENCOUNTER — Ambulatory Visit: Payer: Medicare HMO

## 2019-05-25 ENCOUNTER — Other Ambulatory Visit: Payer: Self-pay

## 2019-05-25 DIAGNOSIS — I350 Nonrheumatic aortic (valve) stenosis: Secondary | ICD-10-CM

## 2019-06-10 ENCOUNTER — Encounter: Payer: Self-pay | Admitting: Cardiology

## 2019-06-10 ENCOUNTER — Ambulatory Visit: Payer: Medicare HMO | Admitting: Cardiology

## 2019-06-10 ENCOUNTER — Other Ambulatory Visit: Payer: Self-pay

## 2019-06-10 VITALS — BP 140/80 | HR 74 | Temp 97.3°F | Resp 16 | Ht 66.0 in | Wt 177.0 lb

## 2019-06-10 DIAGNOSIS — E119 Type 2 diabetes mellitus without complications: Secondary | ICD-10-CM

## 2019-06-10 DIAGNOSIS — Z87891 Personal history of nicotine dependence: Secondary | ICD-10-CM

## 2019-06-10 DIAGNOSIS — I10 Essential (primary) hypertension: Secondary | ICD-10-CM

## 2019-06-10 DIAGNOSIS — I350 Nonrheumatic aortic (valve) stenosis: Secondary | ICD-10-CM

## 2019-06-10 DIAGNOSIS — E782 Mixed hyperlipidemia: Secondary | ICD-10-CM

## 2019-06-10 NOTE — Progress Notes (Signed)
Primary Physician:  Aletha Halim., PA-C   Patient ID: Keith Wagner, male    DOB: 10/29/1940, 79 y.o.   MRN: [id number]  Subjective:    Chief Complaint  Patient presents with  . Aortic Stenosis  . Results    Echocardiogram  . Follow-up    6 month    HPI: Keith Wagner  is a 79 y.o. 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  He presents to the office with a chief complaint of follow-up on his valvular heart disease.    Since last office visit patient denies any hospitalizations or urgent care visits.  From a cardiovascular standpoint he remains stable.  Patient was recommended to undergo an echocardiogram at the last visit to reevaluate the progression of his valvular heart disease.  Echocardiogram findings results reviewed with the patient at today's office visit and noted below for further reference.   Patient denies any symptoms of congestive heart failure, syncope, or angina.  He does not smoke. He does use a stationary bicycle and also walks regularly that he tolerates well. He is also is very active with gardening.  No history of thyroid problems. No history of rheumatic fever. No history of TIA or CVA.  Past Medical History:  Diagnosis Date  . Arthritis   . Asthma    uses inhaler PRN  . Bilateral carotid bruits 06/12/2018  . Cataract   . Diabetes mellitus without complication ([place])    per pt "pre-diabetic"- off Metformin- diet controlled   . GERD (gastroesophageal reflux disease)   . Gout   . Heart murmur   . Hypercholesteremia 06/12/2018  . Hyperlipidemia   . Hypertension   . Mild aortic stenosis 06/12/2018    Past Surgical History:  Procedure Laterality Date  . CATARACT EXTRACTION Left   . COLONOSCOPY    . POLYPECTOMY     Social History   Tobacco Use  . Smoking status: Former Smoker    Types: Cigars  . Smokeless tobacco: Never Used  Substance Use Topics  .  Alcohol use: No  . Drug use: No    Current Outpatient Medications on File Prior to Visit  Medication Sig Dispense Refill  . albuterol (PROVENTIL) (2.5 MG/3ML) 0.083% nebulizer solution Take 2.5 mg by nebulization as needed for wheezing.    Marland Kitchen amLODipine (NORVASC) 10 MG tablet     . aspirin 81 MG tablet Take 81 mg by mouth daily.    Marland Kitchen atorvastatin (LIPITOR) 20 MG tablet Take 40 mg by mouth daily.     . furosemide (LASIX) 20 MG tablet Take 20 mg by mouth.    . GuaiFENesin (MUCINEX PO) Take 2 tablets by mouth daily as needed.    . hydrALAZINE (APRESOLINE) 25 MG tablet Take 1 tablet (25 mg total) by mouth 2 (two) times daily. 180 tablet 4  . indomethacin (INDOCIN) 25 MG capsule Take 25 mg by mouth 2 (two) times daily with a meal.    . lisinopril (PRINIVIL,ZESTRIL) 20 MG tablet Take 40 mg by mouth daily.     . LUTEIN PO Take by mouth daily.    . Multiple Vitamins-Minerals (MULTIVITAMIN PO) Take by mouth daily.    . naproxen (NAPROSYN) 500 MG tablet TAKE ONE TABLET BY MOUTH EVERY 12 HOURS AS NEEDED (REPLACING INDOMETHACIN)    . Omega-3 Fatty Acids (OMEGA 3 PO) Take 1,000 mg by mouth. 4 tablets daily    . OVER THE COUNTER MEDICATION Take by mouth.  Super Beta Prostate     No current facility-administered medications on file prior to visit.   Review of Systems  Constitution: Negative for decreased appetite, malaise/fatigue, weight gain and weight loss.  Eyes: Negative for visual disturbance.  Cardiovascular: Negative for chest pain, claudication, dyspnea on exertion, leg swelling, orthopnea, palpitations and syncope.  Respiratory: Negative for hemoptysis and wheezing.   Endocrine: Negative for cold intolerance and heat intolerance.  Hematologic/Lymphatic: Does not bruise/bleed easily.  Skin: Negative for nail changes.  Musculoskeletal: Negative for muscle weakness and myalgias.  Gastrointestinal: Negative for abdominal pain, change in bowel habit, nausea and vomiting.  Neurological: Negative  for difficulty with concentration, dizziness, focal weakness and headaches.  Psychiatric/Behavioral: Negative for altered mental status and suicidal ideas.  All other systems reviewed and are negative.     Objective:  Blood pressure 140/80, pulse 74, temperature (!) 97.3 F (36.3 C), temperature source Temporal, resp. rate 16, height '5\' 6"'$  (1.676 m), weight 177 lb (80.3 kg), SpO2 96 %. Body mass index is 28.57 kg/m.    Physical Exam  Constitutional: He appears well-developed and well-nourished. No distress.  HENT:  Head: Atraumatic.  Eyes: Conjunctivae are normal.  Neck: No JVD present. No thyromegaly present.  Cardiovascular: Normal rate, regular rhythm and intact distal pulses. Exam reveals no gallop.  Murmur heard.  Systolic murmur is present with a grade of 3/6. Grade 3/6 ESM is audible in the aortic, left parasternal area and apex Pulses:      Carotid pulses are on the right side with bruit and on the left side with bruit. Pulmonary/Chest: Effort normal and breath sounds normal.  Abdominal: Soft. Bowel sounds are normal.  Musculoskeletal:        General: No edema. Normal range of motion.     Cervical back: Neck supple.  Neurological: He is alert.  Skin: Skin is warm and dry.  Psychiatric: He has a normal mood and affect.  Vitals reviewed.  Radiology: No results found.  Laboratory examination:   04/30/2018: Cholesterol 161, triglycerides 286, HDL 44, LDL 90. K 3.2, Creatinine 0.96, ALT 66, CMP otherwise normal. CBC normal.  11/09/2017: Hemoglobin A1c 5.8%.  Potassium 4.2, creatinine 1.03, ALT minimally elevated at 56, CMP otherwise normal.  RBC mildly decreased at 4.69, CBC otherwise normal.  Cholesterol 153, triglycerides 206, HDL 38, LDL 93.  Cardiac Studies:   EKG  12/14/2018: Normal sinus rhythm at 65 bpm with first-degree AV block, leftward axis, poor R-wave progression.  Nonspecific ST abnormality.   06/10/2019: Normal sinus rhythm with a ventricular rate of 77  bpm without underlying ischemia or injury pattern.  Carotid artery duplex 12/04/2017: Minimal stenosis in the right internal carotid artery (1-15%). Minimal stenosis in the left internal carotid artery (1-15%). Antegrade right vertebral artery flow. Antegrade left vertebral artery flow. Follow up when clinically indicated.  Echocardiogram: 12/04/2017: LVEF 56%, concentric LVH, grade 1 diastolic impairment, mild AR, mild TR, mild to moderate TR, RVSP 33 mmHg. Mild aortic valve stenosis. Aortic valve mean gradient of 16 mmHg, Vmax of 2.6 m/s. Calculated aortic valve area by continuity equation is 2.5 cm.  05/25/2019: LVEF 50-55%, moderate concentric LVH, grade 1 diastolic impairment, moderate MR, mild TR, RVSP 26 mmHg.  Aortic valve peak velocity of 2.8 m/s, mean gradient 16 mmHg, aortic valve area by continuity equation 1.2 cm, dimensional index 0.3 suggestive of mild to moderate aortic stenosis.  Treadmill Exercise stress 11/11/2014: Indication: Diabetes, HTN The patient exercised according to Bruce Protocol, Total time recorded  6:00  min achieving max heart rate of  148 which was  100% of MPHR for age and  7.05 METS of work. Occasional PVC during exercise.  Normal BP response. Resting ECG showing NSR. There was no ST-T changes of ischemia with exercise stress test. No exercise induced arrythmias noted. Stress terminated due to [facility] met.  Sleep Study 2011: Neg for Sleep Apnea  Assessment:     ICD-10-CM   1. Nonrheumatic aortic valve stenosis  I35.0 EKG 12-Lead  2. Benign hypertension  I10   3. Mixed hyperlipidemia  E78.2   4. Diet-controlled diabetes mellitus ([place])  E11.9   5. Former smoker  Z87.891    Recommendations:   Keith Wagner is a 79 y.o. 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  Aortic stenosis:  Patient's most recent  echocardiogram illustrates that his aortic stenosis is now mild / moderate intensity.  Patient does not have any symptoms of congestive heart failure, angina, or syncope.  Patient is educated to call the office sooner if he has any symptoms as mentioned above.  Recommend echocardiogram in 1 year for follow-up or sooner if change in clinical status.  Benign essential hypertension:  Patient states that his blood pressures at home are well controlled.  His systolic blood pressures range between 546-503 mmHg and diastolic blood pressures range between 67-85 mmHg.  Medications reconciled. . If the blood pressure is consistently greater than 156mHg patient is asked to call the office to for medication titration sooner than the next office visit.  . Low salt diet recommended. A diet that is rich in fruits, vegetables, legumes, and low-fat dairy products and low in snacks, sweets, and meats (such as the Dietary Approaches to Stop Hypertension [DASH] diet).   Mixed hyperlipidemia: . Continue statin therapy.   . Follow lipids.  He has repeat blood work scheduled in April 2021 prior to his next physical.  He is asked to have a copy forwarded to the office for further review. . Currently managed by primary care provider. . Patient denies myalgia or other side effects.  Orders Placed This Encounter  Procedures  . EKG 12-Lead   --Continue cardiac medications as reconciled in final medication list. --Return in about 6 months (around 12/11/2019). Or sooner if needed. --Continue follow-up with your primary care physician regarding the management of your other chronic comorbid conditions.  Patient's questions and concerns were addressed to his satisfaction. He voices understanding of the instructions provided during this encounter.   This note was created using a voice recognition software as a result there may be grammatical errors inadvertently enclosed that do not reflect the nature of this  encounter. Every attempt is made to correct such errors.  SRex Kras DO, FMapletonCardiovascular. PFairfieldOffice: [phone]

## 2019-06-29 DIAGNOSIS — H353230 Exudative age-related macular degeneration, bilateral, stage unspecified: Secondary | ICD-10-CM | POA: Insufficient documentation

## 2019-06-29 DIAGNOSIS — E119 Type 2 diabetes mellitus without complications: Secondary | ICD-10-CM | POA: Insufficient documentation

## 2019-09-02 ENCOUNTER — Other Ambulatory Visit: Payer: Self-pay | Admitting: Cardiology

## 2019-09-02 DIAGNOSIS — I10 Essential (primary) hypertension: Secondary | ICD-10-CM

## 2019-12-10 ENCOUNTER — Encounter: Payer: Self-pay | Admitting: Cardiology

## 2019-12-10 ENCOUNTER — Ambulatory Visit: Payer: Medicare HMO | Admitting: Cardiology

## 2019-12-10 ENCOUNTER — Other Ambulatory Visit: Payer: Self-pay

## 2019-12-10 VITALS — BP 140/80 | HR 58 | Resp 16 | Ht 66.0 in | Wt 178.0 lb

## 2019-12-10 DIAGNOSIS — Z87891 Personal history of nicotine dependence: Secondary | ICD-10-CM

## 2019-12-10 DIAGNOSIS — E782 Mixed hyperlipidemia: Secondary | ICD-10-CM

## 2019-12-10 DIAGNOSIS — I10 Essential (primary) hypertension: Secondary | ICD-10-CM

## 2019-12-10 DIAGNOSIS — I350 Nonrheumatic aortic (valve) stenosis: Secondary | ICD-10-CM

## 2019-12-10 DIAGNOSIS — E119 Type 2 diabetes mellitus without complications: Secondary | ICD-10-CM

## 2019-12-10 MED ORDER — HYDRALAZINE HCL 25 MG PO TABS: 25.0000 mg | ORAL_TABLET | Freq: Three times a day (TID) | ORAL | 0 refills | Status: DC

## 2019-12-10 NOTE — Progress Notes (Signed)
Keith Wagner Date of Birth: September 27, 1940 MRN: [id number] Primary Care Provider:Kaplan, Baldemar Friday., PA-C Former Cardiology Providers: Jeri Lager, APRN, FNP-C Primary Cardiologist: Keith Kras, DO, [facility] (established care 06/10/2019)   Date: 12/10/19 Last Office Visit: 06/10/2019  Chief Complaint  Patient presents with  . Nonrheumatic aortic valve stenosis  . Follow-up    6 month     HPI  Keith Wagner is a 79 y.o. 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   Since last office visit patient denies any hospitalizations or urgent care visits.  From a cardiovascular standpoint he remains stable.  Denies any symptoms of congestive heart failure, angina pectoris, or syncope.  Patient has been vaccinated against COVID-19 since last visit.  He continues to be very active at home.  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  Since last visit he is not using a stationary bike as much due to decreased motivation.  Patient states that his blood pressures at home are usually between 130-140 mmHg.  This morning patient states that his blood pressure was 130/79.   Past Medical History:  Diagnosis Date  . Arthritis   . Asthma    uses inhaler PRN  . Bilateral carotid bruits 06/12/2018  . Cataract   . Diabetes mellitus without complication ([place])    per pt "pre-diabetic"- off Metformin- diet controlled   . GERD (gastroesophageal reflux disease)   . Gout   . Heart murmur   . Hypercholesteremia 06/12/2018  . Hyperlipidemia   . Hypertension   . Mild aortic stenosis 06/12/2018    Past Surgical History:  Procedure Laterality Date  . CATARACT EXTRACTION Left   . COLONOSCOPY    . POLYPECTOMY     Social History   Tobacco Use   . Smoking status: Former Smoker    Types: Cigars  . Smokeless tobacco: Never Used  Vaping Use  . Vaping Use: Never used  Substance Use Topics  . Alcohol use: No  . Drug use: No    Current Outpatient Medications on File Prior to Visit  Medication Sig Dispense Refill  . albuterol (PROVENTIL) (2.5 MG/3ML) 0.083% nebulizer solution Take 2.5 mg by nebulization as needed for wheezing.    Marland Kitchen amLODipine (NORVASC) 10 MG tablet     . aspirin 81 MG tablet Take 81 mg by mouth daily.    Marland Kitchen atorvastatin (LIPITOR) 20 MG tablet Take 40 mg by mouth daily.     . furosemide (LASIX) 20 MG tablet Take 20 mg by mouth.    . GuaiFENesin (MUCINEX PO) Take 2 tablets by mouth daily as needed.    Marland Kitchen lisinopril (ZESTRIL) 40 MG tablet Take 40 mg by mouth daily.     . LUTEIN PO Take by mouth daily.    . Multiple Vitamins-Minerals (MULTIVITAMIN PO) Take by mouth daily.    . naproxen (NAPROSYN) 500 MG tablet TAKE ONE TABLET BY MOUTH EVERY 12 HOURS AS NEEDED (REPLACING INDOMETHACIN)    . Omega-3 Fatty Acids (OMEGA 3 PO) Take 1,000 mg by mouth. 4 tablets daily    . OVER THE COUNTER MEDICATION Take by mouth. Super Beta Prostate     No current facility-administered medications on file prior to visit.   Review of Systems  Constitutional: Negative for decreased  appetite, malaise/fatigue, weight gain and weight loss.  Eyes: Negative for visual disturbance.  Cardiovascular: Negative for chest pain, claudication, dyspnea on exertion, leg swelling, orthopnea, palpitations and syncope.  Respiratory: Negative for hemoptysis and wheezing.   Endocrine: Negative for cold intolerance and heat intolerance.  Hematologic/Lymphatic: Does not bruise/bleed easily.  Skin: Negative for nail changes.  Musculoskeletal: Negative for muscle weakness and myalgias.  Gastrointestinal: Negative for abdominal pain, change in bowel habit, nausea and vomiting.  Neurological: Negative for difficulty with concentration, dizziness, focal weakness and  headaches.  Psychiatric/Behavioral: Negative for altered mental status and suicidal ideas.  All other systems reviewed and are negative.     Objective:  Blood pressure 140/80, pulse (!) 58, resp. rate 16, height $RemoveBe'5\' 6"'[id number]$  (1.676 m), weight 178 lb (80.7 kg), SpO2 95 %. Body mass index is 28.73 kg/m.    Physical Exam Vitals reviewed.  Constitutional:      General: He is not in acute distress.    Appearance: He is well-developed.  HENT:     Head: Atraumatic.  Eyes:     Conjunctiva/sclera: Conjunctivae normal.  Neck:     Thyroid: No thyromegaly.     Vascular: No JVD.  Cardiovascular:     Rate and Rhythm: Normal rate and regular rhythm.     Pulses: Intact distal pulses.          Carotid pulses are on the right side with bruit and on the left side with bruit.    Heart sounds: Murmur heard.  Systolic murmur is present with a grade of 3/6. Grade 3/6 ESM is audible in the aortic, left parasternal area and apex   No gallop.   Pulmonary:     Effort: Pulmonary effort is normal.     Breath sounds: Normal breath sounds.  Abdominal:     General: Bowel sounds are normal.     Palpations: Abdomen is soft.  Musculoskeletal:        General: Normal range of motion.     Cervical back: Neck supple.  Skin:    General: Skin is warm and dry.  Neurological:     Mental Status: He is alert.    Radiology: No results found.  Laboratory examination:   04/30/2018: Cholesterol 161, triglycerides 286, HDL 44, LDL 90. K 3.2, Creatinine 0.96, ALT 66, CMP otherwise normal. CBC normal.  11/09/2017: Hemoglobin A1c 5.8%.  Potassium 4.2, creatinine 1.03, ALT minimally elevated at 56, CMP otherwise normal.  RBC mildly decreased at 4.69, CBC otherwise normal.  Cholesterol 153, triglycerides 206, HDL 38, LDL 93.  Cardiac Studies:   EKG : 12/10/2019: Normal sinus rhythm, 67 bpm, left axis deviation, poor R wave progression, frequent PVCs, without underlying injury pattern.  Carotid artery duplex  12/04/2017: Minimal stenosis in the right internal carotid artery (1-15%). Minimal stenosis in the left internal carotid artery (1-15%). Antegrade right vertebral artery flow. Antegrade left vertebral artery flow. Follow up when clinically indicated.  Echocardiogram: 05/25/2019: LVEF 50-55%, moderate concentric LVH, grade 1 diastolic impairment, moderate MR, mild TR, RVSP 26 mmHg.  Aortic valve peak velocity of 2.8 m/s, mean gradient 16 mmHg, aortic valve area by continuity equation 1.2 cm, dimensional index 0.3 suggestive of mild to moderate aortic stenosis.  Treadmill Exercise stress 11/11/2014: Indication: Diabetes, HTN The patient exercised according to Bruce Protocol, Total time recorded  6:00 min achieving max heart rate of  148 which was  100% of MPHR for age and  7.05 METS of work. Occasional PVC during exercise.  Normal  BP response. Resting ECG showing NSR. There was no ST-T changes of ischemia with exercise stress test. No exercise induced arrythmias noted. Stress terminated due to [facility] met.  Sleep Study 2011: Neg for Sleep Apnea  Assessment:     ICD-10-CM   1. Mild to moderate aortic stenosis  I35.0 ECHOCARDIOGRAM COMPLETE  2. Benign essential hypertension  I10 EKG 12-Lead    hydrALAZINE (APRESOLINE) 25 MG tablet  3. Mixed hyperlipidemia  E78.2   4. Diet-controlled diabetes mellitus ([place])  E11.9   5. Former smoker  Z87.891    Recommendations:   Bryceton Hantz is a 79 y.o. 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  Aortic stenosis:  Patient's most recent echocardiogram illustrates that his aortic stenosis is mild / moderate intensity.  Patient does not have any symptoms of congestive heart failure, angina, or syncope.  Patient is educated to call the office sooner if he has any symptoms as mentioned above.  Recommend  echocardiogram in 1 year for follow-up.  Scheduled for an echocardiogram at [facility] in March 2022.  Follow-up thereafter.  Benign essential hypertension:  Patient states that his home blood pressures usually range between 140-150 mmHg.  Currently managed by primary care physician.  May consider increasing lisinopril to losartan to help reduce the afterload and to bring his blood pressures down to predominately 130 mmHg if able to tolerate.  Patient states that he will discuss it further with his PCP in the next visit.   . Low-salt diet recommended.   . Patient is asking a refill on hydralazine.  We will increase hydralazine to 25 mg p.o. 3 times daily.  Prescription sent to her mail order pharmacy  Mixed hyperlipidemia: . Continue statin therapy.   . Follow lipids.  He has repeat blood work scheduled in October 2021 prior to his next physical.  He is asked to have a copy forwarded to the office for further review. . Currently managed by primary care provider. . Patient denies myalgia or other side effects.  Orders Placed This Encounter  Procedures  . EKG 12-Lead  . ECHOCARDIOGRAM COMPLETE   --Continue cardiac medications as reconciled in final medication list. --Return in about 6 months (around 06/22/2020) for Reevaluation of AS and review echo . Or sooner if needed. --Continue follow-up with your primary care physician regarding the management of your other chronic comorbid conditions.  Patient's questions and concerns were addressed to his satisfaction. He voices understanding of the instructions provided during this encounter.   This note was created using a voice recognition software as a result there may be grammatical errors inadvertently enclosed that do not reflect the nature of this encounter. Every attempt is made to correct such errors.  Keith Wagner, [place], [facility]  Pager: [phone] Office: [phone]

## 2020-03-08 ENCOUNTER — Other Ambulatory Visit: Payer: Self-pay | Admitting: Cardiology

## 2020-03-08 DIAGNOSIS — I10 Essential (primary) hypertension: Secondary | ICD-10-CM

## 2020-05-11 ENCOUNTER — Other Ambulatory Visit: Payer: Self-pay | Admitting: Cardiology

## 2020-05-11 DIAGNOSIS — I10 Essential (primary) hypertension: Secondary | ICD-10-CM

## 2020-06-08 ENCOUNTER — Ambulatory Visit: Payer: Medicare HMO | Admitting: Cardiology

## 2020-06-26 ENCOUNTER — Other Ambulatory Visit: Payer: Self-pay

## 2020-06-26 ENCOUNTER — Ambulatory Visit (HOSPITAL_COMMUNITY)
Admission: RE | Admit: 2020-06-26 | Discharge: 2020-06-26 | Disposition: A | Discharge status: Home / Self Care | Payer: Medicare HMO | Source: Ambulatory Visit | Attending: Cardiology | Admitting: Cardiology

## 2020-06-26 DIAGNOSIS — I350 Nonrheumatic aortic (valve) stenosis: Secondary | ICD-10-CM | POA: Diagnosis not present

## 2020-06-26 NOTE — Progress Notes (Signed)
  Echocardiogram 2D Echocardiogram has been performed.  Keith Wagner 06/26/2020, 1:23 PM

## 2020-06-30 NOTE — Progress Notes (Signed)
[name] Date of Birth: Apr 11, 1940 MRN: [id number] Primary Care Provider:Kaplan, Baldemar Friday., PA-C Former Cardiology Providers: Jeri Lager, APRN, FNP-C Primary Cardiologist: Rex Kras, DO, [facility] (established care 06/10/2019)   Date: 07/03/20 Last Office Visit: 12/10/2019  Chief Complaint  Patient presents with  . 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 Follow-up    6 months    . Results    Echo    HPI  Ryun Velez is a 80 y.o. 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   Patient is being followed by the office for management of aortic stenosis.  He presents for 636-monthollow-up and states that he is doing well from a cardiovascular standpoint.  Denies any heart failure symptoms, angina pectoris, or syncopal events.  Since last office visit his lisinopril has been discontinued and changed to losartan 100 mg p.o. daily by his PCP.  I agree with this change.  He continues to do yard work as much as possible but will be starting in full force as the summer is soon arriving.  He continues to do his stationary bike for 15 to 30 minutes 3 times a week.  His systolic blood pressures at home are around 130 mmHg.   Past Medical History:  Diagnosis Date  . Arthritis   . Asthma    uses inhaler PRN  . Bilateral carotid bruits 06/12/2018  . Cataract   . Diabetes mellitus without complication (HCGates   per pt "pre-diabetic"- off Metformin- diet controlled   . GERD (gastroesophageal reflux disease)   . Gout   . Heart murmur   . Hypercholesteremia 06/12/2018  . Hyperlipidemia   . Hypertension   . Mild aortic stenosis 06/12/2018    Past Surgical History:  Procedure Laterality Date  . CATARACT EXTRACTION Left   .  COLONOSCOPY    . POLYPECTOMY     Social History   Tobacco Use  . Smoking status: Former Smoker    Types: Cigars  . Smokeless tobacco: Never Used  Vaping Use  . Vaping Use: Never used  Substance Use Topics  . Alcohol use: No  . Drug use: No    Current Outpatient Medications on File Prior to Visit  Medication Sig Dispense Refill  . aspirin 81 MG tablet Take 81 mg by mouth daily.    . Marland Kitchentorvastatin (LIPITOR) 20 MG tablet Take 40 mg by mouth daily.     . furosemide (LASIX) 20 MG tablet Take 20 mg by mouth.    . GuaiFENesin (MUCINEX PO) Take 2 tablets by mouth daily as needed.    . hydrALAZINE (APRESOLINE) 25 MG tablet TAKE 1 TABLET THREE TIMES DAILY 270 tablet 0  . losartan (COZAAR) 100 MG tablet Take 100 mg by mouth daily.    . LUTEIN PO Take by mouth daily.    . Multiple Vitamins-Minerals (MULTIVITAMIN PO) Take by mouth daily.    . naproxen (NAPROSYN) 500 MG tablet TAKE ONE TABLET BY MOUTH EVERY 12 HOURS AS NEEDED (REPLACING INDOMETHACIN)    . Omega-3 Fatty Acids (OMEGA 3 PO) Take 1,000 mg by mouth. 4 tablets daily    . OVER THE COUNTER MEDICATION Take by mouth. Super Beta Prostate     No current facility-administered medications on file prior to visit.   Review  of Systems  Constitutional: Negative for decreased appetite, malaise/fatigue, weight gain and weight loss.  Eyes: Negative for visual disturbance.  Cardiovascular: Negative for chest pain, claudication, dyspnea on exertion, leg swelling, orthopnea, palpitations and syncope.  Respiratory: Negative for hemoptysis and wheezing.   Endocrine: Negative for cold intolerance and heat intolerance.  Hematologic/Lymphatic: Does not bruise/bleed easily.  Skin: Negative for nail changes.  Musculoskeletal: Negative for muscle weakness and myalgias.  Gastrointestinal: Negative for abdominal pain, change in bowel habit, nausea and vomiting.  Neurological: Negative for difficulty with concentration, dizziness, focal weakness and  headaches.  Psychiatric/Behavioral: Negative for altered mental status and suicidal ideas.  All other systems reviewed and are negative.     Objective:  Blood pressure 130/78, pulse 82, temperature 98.3 F (36.8 C), temperature source Temporal, resp. rate 16, height 5' 6" (1.676 m), weight 178 lb 12.8 oz (81.1 kg), SpO2 97 %. Body mass index is 28.86 kg/m.    Physical Exam Vitals reviewed.  Constitutional:      General: He is not in acute distress.    Appearance: He is well-developed.  HENT:     Head: Atraumatic.  Eyes:     Conjunctiva/sclera: Conjunctivae normal.  Neck:     Thyroid: No thyromegaly.     Vascular: No JVD.  Cardiovascular:     Rate and Rhythm: Normal rate and regular rhythm.     Pulses: Intact distal pulses.          Carotid pulses are on the right side with bruit and on the left side with bruit.    Heart sounds: Murmur heard.   Systolic murmur is present with a grade of 3/6. Grade 3/6 ESM is audible in the aortic, left parasternal area and apex  No gallop.   Pulmonary:     Effort: Pulmonary effort is normal.     Breath sounds: Normal breath sounds.  Abdominal:     General: Bowel sounds are normal.     Palpations: Abdomen is soft.  Musculoskeletal:        General: Normal range of motion.     Cervical back: Neck supple.  Skin:    General: Skin is warm and dry.  Neurological:     Mental Status: He is alert.    Radiology: No results found.  Laboratory examination:   04/30/2018: Cholesterol 161, triglycerides 286, HDL 44, LDL 90. K 3.2, Creatinine 0.96, ALT 66, CMP otherwise normal. CBC normal.  11/09/2017: Hemoglobin A1c 5.8%.  Potassium 4.2, creatinine 1.03, ALT minimally elevated at 56, CMP otherwise normal.  RBC mildly decreased at 4.69, CBC otherwise normal.  Cholesterol 153, triglycerides 206, HDL 38, LDL 93.  Cardiac Studies:   EKG : 12/10/2019: Normal sinus rhythm, 67 bpm, left axis deviation, poor R wave progression, frequent PVCs, without  underlying injury pattern.  07/03/2020: Normal sinus rhythm, left axis deviation, left anterior fascicular block, nonspecific T wave abnormality, without underlying injury pattern.  Carotid artery duplex 12/04/2017: Minimal stenosis in the right internal carotid artery (1-15%). Minimal stenosis in the left internal carotid artery (1-15%). Antegrade right vertebral artery flow. Antegrade left vertebral artery flow. Follow up when clinically indicated.  Echocardiogram: 05/25/2019: LVEF 50-55%, moderate concentric LVH, grade 1 diastolic impairment, moderate MR, mild TR, RVSP 26 mmHg.  Aortic valve peak velocity of 2.8 m/s, mean gradient 16 mmHg, aortic valve area by continuity equation 1.2 cm, dimensional index 0.3 suggestive of mild to moderate aortic stenosis.  04/04/202: 1. Left ventricular ejection fraction, by estimation, is 60 to  65%. The left ventricle has normal function. The left ventricle has no regional wall motion abnormalities. There is mild left ventricular hypertrophy. Left ventricular diastolic parameters were normal. Elevated left atrial pressure.  2. Right ventricular systolic function is normal. The right ventricular size is normal. There is normal pulmonary artery systolic pressure. The estimated right ventricular systolic pressure is 35.5 mmHg.  3. The mitral valve is normal in structure. Mild mitral valve regurgitation. No evidence of mitral stenosis.  4. 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 TMarland Kitchene aortic valve is calcified. Aortic valve regurgitation is mild.   Treadmill Exercise stress 11/11/2014: Indication: Diabetes, HTN The patient exercised according to Bruce Protocol, Total time recorded  6:00 min achieving max heart rate of  148 which was  100% of MPHR for age and  7.05 METS of work. Occasional PVC during exercise.  Normal BP response. Resting ECG showing NSR. There was no ST-T changes of ischemia  with exercise stress test. No exercise induced arrythmias noted. [facility]  Sleep Study 2011: Neg for Sleep Apnea  Assessment:     ICD-10-CM   1. 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 Benign essential hypertension  I10   3. Mixed hyperlipidemia  E78.2   4. Diet-controlled diabetes mellitus (HCCSouth KensingtonE11.9    Recommendations:   WilGiordan Fordham a 79 26o. 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  Aortic stenosis:  [facility].    Remains asymptomatic.    Patient understands to seek medical attention if he has symptoms of angina pectoris, congestive heart failure, or syncope.    Given his underlying aortic stenosis I agree with higher dose of losartan as it has improved his blood pressures since last visit.  We will also discontinue amlodipine and transition him to Toprol-XL.  Medication profile discussed.  His aortic stenosis has remained relatively stable over the last 2 echocardiograms.  He would be very reasonable to check disease severity in 1 year.  We will order an echo at the next office visit.  Both the patient and wife are agreeable with the plan of care.  Benign essential hypertension:  Patient states that his home blood pressures now averaging 130 mmHg.  Since last visit lisinopril changed to losartan.  We will discontinue amlodipine and change it to Toprol-XL given his underlying aortic stenosis.  Diuretics are usually not a main line therapy in the setting of aortic stenosis.  However, he has evidence of elevated LAP on echocardiogram and therefore will continue.  Currently managed by primary care physician.  Low-salt diet recommended.     Mixed hyperlipidemia: . Continue statin therapy.   . Currently managed by primary care provider. . Patient denies myalgia or other side effects. . Patient has an upcoming appointment with his PCP later this month.  He states that he will provide us Koreacopy of his most recent labs.  Orders Placed This Encounter  Procedures  . EKG 12-Lead   --Continue cardiac medications as reconciled in final medication list. --Return in about 6 months (around 01/02/2021) for Follow up aortic stenosis. Or sooner if needed. --Continue follow-up with your primary care physician regarding the  management of your other chronic comorbid conditions.  Patient's questions and concerns were addressed to his satisfaction. He voices understanding of the instructions provided during this encounter.   This note was created using a voice recognition software as a result there may be grammatical errors inadvertently enclosed that do not reflect the nature of this encounter. Every attempt is made to correct such errors.  Rex Kras, [place], [facility]  Pager: [phone] Office: [phone]

## 2020-07-03 ENCOUNTER — Other Ambulatory Visit: Payer: Self-pay

## 2020-07-03 ENCOUNTER — Ambulatory Visit: Payer: Medicare HMO | Admitting: Cardiology

## 2020-07-03 ENCOUNTER — Encounter: Payer: Self-pay | Admitting: Cardiology

## 2020-07-03 VITALS — BP 130/78 | HR 82 | Temp 98.3°F | Resp 16 | Ht 66.0 in | Wt 178.8 lb

## 2020-07-03 DIAGNOSIS — I10 Essential (primary) hypertension: Secondary | ICD-10-CM

## 2020-07-03 DIAGNOSIS — E782 Mixed hyperlipidemia: Secondary | ICD-10-CM

## 2020-07-03 DIAGNOSIS — I350 Nonrheumatic aortic (valve) stenosis: Secondary | ICD-10-CM

## 2020-07-03 DIAGNOSIS — E119 Type 2 diabetes mellitus without complications: Secondary | ICD-10-CM

## 2020-07-03 LAB — ECHOCARDIOGRAM COMPLETE
AR max vel: 0.95 cm2
AV Area VTI: 0.98 cm2
AV Area mean vel: 0.99 cm2
AV Mean grad: 15.5 mmHg
AV Peak grad: 29 mmHg
Ao pk vel: 2.69 m/s
Area-P 1/2: 5.13 cm2
P 1/2 time: 532 msec
S' Lateral: 3 cm
Single Plane A4C EF: 56.6 %

## 2020-07-03 MED ORDER — METOPROLOL SUCCINATE ER 25 MG PO TB24: 25.0000 mg | ORAL_TABLET | Freq: Every day | ORAL | 0 refills | Status: DC

## 2020-07-19 ENCOUNTER — Other Ambulatory Visit: Payer: Self-pay | Admitting: Cardiology

## 2020-07-19 DIAGNOSIS — I10 Essential (primary) hypertension: Secondary | ICD-10-CM

## 2020-07-26 ENCOUNTER — Other Ambulatory Visit: Payer: Self-pay | Admitting: Cardiology

## 2020-07-26 DIAGNOSIS — I350 Nonrheumatic aortic (valve) stenosis: Secondary | ICD-10-CM

## 2020-08-01 ENCOUNTER — Telehealth: Payer: Self-pay

## 2020-08-03 ENCOUNTER — Other Ambulatory Visit: Payer: Self-pay

## 2020-08-03 DIAGNOSIS — I350 Nonrheumatic aortic (valve) stenosis: Secondary | ICD-10-CM

## 2020-08-03 MED ORDER — METOPROLOL SUCCINATE ER 25 MG PO TB24: ORAL_TABLET | ORAL | 3 refills | Status: DC

## 2020-08-03 NOTE — Telephone Encounter (Signed)
Pt is aware and medication as been sent

## 2020-11-15 DIAGNOSIS — L401 Generalized pustular psoriasis: Secondary | ICD-10-CM | POA: Insufficient documentation

## 2020-12-01 ENCOUNTER — Other Ambulatory Visit: Payer: Self-pay

## 2020-12-01 ENCOUNTER — Encounter: Payer: Self-pay | Admitting: Cardiology

## 2020-12-01 ENCOUNTER — Ambulatory Visit: Payer: Medicare HMO | Admitting: Cardiology

## 2020-12-01 VITALS — BP 137/89 | HR 90 | Temp 97.0°F | Resp 16 | Ht 66.0 in | Wt 173.8 lb

## 2020-12-01 DIAGNOSIS — I5031 Acute diastolic (congestive) heart failure: Secondary | ICD-10-CM

## 2020-12-01 DIAGNOSIS — R0600 Dyspnea, unspecified: Secondary | ICD-10-CM

## 2020-12-01 DIAGNOSIS — Z87891 Personal history of nicotine dependence: Secondary | ICD-10-CM

## 2020-12-01 DIAGNOSIS — I10 Essential (primary) hypertension: Secondary | ICD-10-CM

## 2020-12-01 DIAGNOSIS — I350 Nonrheumatic aortic (valve) stenosis: Secondary | ICD-10-CM

## 2020-12-01 DIAGNOSIS — E782 Mixed hyperlipidemia: Secondary | ICD-10-CM

## 2020-12-01 DIAGNOSIS — E119 Type 2 diabetes mellitus without complications: Secondary | ICD-10-CM

## 2020-12-01 DIAGNOSIS — R0609 Other forms of dyspnea: Secondary | ICD-10-CM

## 2020-12-01 MED ORDER — EMPAGLIFLOZIN 10 MG PO TABS: 10.0000 mg | ORAL_TABLET | Freq: Every day | ORAL | 0 refills | Status: DC

## 2020-12-01 MED ORDER — BUMETANIDE 1 MG PO TABS: 1.0000 mg | ORAL_TABLET | Freq: Every morning | ORAL | 0 refills | Status: DC

## 2020-12-01 NOTE — Progress Notes (Signed)
Date:  12/01/2020   ID:  Keith Wagner, DOB 1941-01-02, MRN [id number]  PCP:  Aletha Halim., PA-C  Cardiologist:  Rex Kras, DO, [facility] (established care 06/10/2019) Former Cardiology Providers: Jeri Lager, APRN, FNP-C  Date: 12/01/20 Last Office Visit: 07/03/2020  Chief Complaint  Patient presents with   Congestive Heart Failure    New onset   Follow-up    HPI  Keith Wagner is a 80 y.o. 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  Patient is accompanied by his wife at today's visit.  Patient was last seen in the office back in April 2022 for follow-up of aortic stenosis.  Overall he was asymptomatic and the shared decision was to continue monitoring him clinically and annual echocardiography to evaluate for disease progression.  However, he presents today sooner than his scheduled visit at the request of his primary care provider for evaluation of congestive heart failure.  History obtained by the patient and his wife during today's encounter.  She states that he has been experiencing shortness of breath and congestion for the last 3 weeks.  He initially had a chest x-ray which noted congestion and signs of infection and was treated for pneumonia with antibiotics for 2 weeks.  However, follow-up blood work with PCP noted continued elevated BNP and chest x-ray findings of congestion.  His Lasix was increased to 40 mg p.o. twice daily.  Due to the higher doses of diuretics his weight remains stable but still continues to have shortness of  breath with effort related activities.  Patient's wife states that after this week's lab work she was referred to cardiology for further evaluation and management.  Patient denies any chest pain at rest or with effort related activities.  His shortness of breath with effort related activities and lower extremity swelling is improving but not resolved.  Patient states that his weight at home remained stable.  When compared to prior office visit he has lost 5 pounds.  Denies orthopnea or paroxysmal nocturnal dyspnea.  ALLERGIES: No Known Allergies  MEDICATION LIST PRIOR TO VISIT: Current Meds  Medication Sig   aspirin 81 MG tablet Take 81 mg by mouth daily.   atorvastatin (LIPITOR) 20 MG tablet Take 40 mg by mouth daily.    bumetanide (BUMEX) 1 MG tablet Take 1 tablet (1 mg total) by mouth every morning.   empagliflozin (JARDIANCE) 10 MG TABS tablet Take 1 tablet (10 mg total) by mouth daily before breakfast.   GuaiFENesin (MUCINEX PO) Take 2 tablets by mouth daily as needed.   hydrALAZINE (APRESOLINE) 25 MG tablet TAKE 1 TABLET THREE TIMES DAILY   losartan (COZAAR) 100 MG tablet Take 100 mg by mouth daily.   LUTEIN PO Take by mouth daily.   magnesium oxide (MAG-OX) 400 MG tablet Take 400 mg by mouth daily.   metoprolol succinate (TOPROL-XL) 25 MG 24 hr tablet TAKE 1 TABLET BY MOUTH ONCE DAILY. HOLD IF TOP BLOOD PRESSURE NUMBER IS LESS THAN 100 MMHG OR PILSE LES THAN 60 BPM   Multiple Vitamins-Minerals (MULTIVITAMIN PO) Take by mouth daily.   naproxen (NAPROSYN) 500  MG tablet TAKE ONE TABLET BY MOUTH EVERY 12 HOURS AS NEEDED (REPLACING INDOMETHACIN)   Omega-3 Fatty Acids (OMEGA 3 PO) Take 1,000 mg by mouth. 4 tablets daily   OVER THE COUNTER MEDICATION Take by mouth. Super Beta Prostate   potassium chloride SA (KLOR-CON) 20 MEQ tablet Take 20 mEq by mouth daily.   [DISCONTINUED] furosemide (LASIX) 20 MG tablet Take 40 mg by mouth 2 (two) times daily.     PAST MEDICAL HISTORY: Past  Medical History:  Diagnosis Date   Arthritis    Asthma    uses inhaler PRN   Bilateral carotid bruits 06/12/2018   Cataract    Diabetes mellitus without complication ([place])    per pt "pre-diabetic"- off Metformin- diet controlled    GERD (gastroesophageal reflux disease)    Gout    Heart murmur    Hypercholesteremia 06/12/2018   Hyperlipidemia    Hypertension    Mild aortic stenosis 06/12/2018    PAST SURGICAL HISTORY: Past Surgical History:  Procedure Laterality Date   CATARACT EXTRACTION Left    COLONOSCOPY     POLYPECTOMY      FAMILY HISTORY: The patient family history is not on file.  SOCIAL HISTORY:  The patient  reports that he has quit smoking. His smoking use included cigars. He has never used smokeless tobacco. He reports that he does not drink alcohol and does not use drugs.  REVIEW OF SYSTEMS: Review of Systems  Constitutional: Negative for chills and fever.  HENT:  Negative for hoarse voice and nosebleeds.   Eyes:  Negative for discharge, double vision and pain.  Cardiovascular:  Positive for dyspnea on exertion and leg swelling (improving). Negative for chest pain, claudication, near-syncope, orthopnea, palpitations, paroxysmal nocturnal dyspnea and syncope.  Respiratory:  Positive for cough and shortness of breath. Negative for hemoptysis.   Musculoskeletal:  Negative for muscle cramps and myalgias.  Gastrointestinal:  Negative for abdominal pain, constipation, diarrhea, hematemesis, hematochezia, melena, nausea and vomiting.  Neurological:  Negative for dizziness and light-headedness.   PHYSICAL EXAM: Vitals with BMI 12/01/2020 07/03/2020 12/10/2019  Height _0  _1  _2   Weight 173 lbs 13 oz 178 lbs 13 oz 178 lbs  BMI 28.07 01.02 72.53  Systolic 664 403 474  Diastolic 89 78 80  Pulse 90 82 58    CONSTITUTIONAL: Well-developed and well-nourished. No acute distress.  SKIN: Skin is warm and dry. No rash noted. No cyanosis. No pallor. No jaundice HEAD:  Normocephalic and atraumatic.  EYES: No scleral icterus MOUTH/THROAT: Moist oral membranes.  NECK: No JVD present. No thyromegaly noted.  Bilateral carotid bruits  LYMPHATIC: No visible cervical adenopathy.  CHEST Normal respiratory effort. No intercostal retractions  LUNGS: Clear to auscultation in the upper lung fields with decreased breath sounds at the right lower lung base.  Minimal rales.  No stridor. No wheezes.  CARDIOVASCULAR: Regular, positive S1-S2, 3 out of 6 systolic ejection murmur heard at the second right costal space, soft holosystolic murmur heard at the apex, ABDOMINAL: Soft, nontender, nondistended, positive bowel sounds in all 4 quadrants no apparent ascites.  EXTREMITIES: +1 bilateral peripheral edema, 2+ DP and PT pulses. HEMATOLOGIC: No significant bruising NEUROLOGIC: Oriented to person, place, and time. Nonfocal. Normal muscle tone.  PSYCHIATRIC: Normal mood and affect. Normal behavior. Cooperative  CXR 11/22/2020: No significant change of small layering bilateral pleural effusions and mild patchy overlying airspace opacities.  CARDIAC DATABASE: EKG: 12/01/2020: Normal sinus rhythm, 94 bpm, left axis deviation, left anterior fascicular  block, LVH, nonspecific T wave abnormality, PVCs, without underlying injury pattern.   Echocardiogram: 05/25/2019: LVEF 50-55%, moderate concentric LVH, grade 1 diastolic impairment, moderate MR, mild TR, RVSP 26 mmHg.  Aortic valve peak velocity of 2.8 m/s, mean gradient 16 mmHg, aortic valve area by continuity equation 1.2 cm, dimensional index 0.3 suggestive of mild to moderate aortic stenosis.   Stress Testing: Treadmill Exercise stress 11/11/2014: Indication: Diabetes, HTN The patient exercised according to Bruce Protocol, Total time recorded  6:00 min achieving max heart rate of  148 which was  100% of MPHR for age and  7.05 METS of work. Occasional PVC during exercise.  Normal BP response. Resting ECG showing NSR. There was no  ST-T changes of ischemia with exercise stress test. No exercise induced arrythmias noted. Stress terminated due to [facility] met.  Heart Catheterization: None  Carotid artery duplex 12/04/2017: Minimal stenosis in the right internal carotid artery (1-15%). Minimal stenosis in the left internal carotid artery (1-15%). Antegrade right vertebral artery flow. Antegrade left vertebral artery flow. Follow up when clinically indicated.  LABORATORY DATA: No flowsheet data found.  No flowsheet data found.  Lipid Panel  No results found for: CHOL, TRIG, HDL, CHOLHDL, VLDL, LDLCALC, LDLDIRECT, LABVLDL  No components found for: NTPROBNP No results for input(s): PROBNP in the last 8760 hours. No results for input(s): TSH in the last 8760 hours.  BMP No results for input(s): NA, K, CL, CO2, GLUCOSE, BUN, CREATININE, CALCIUM, GFRNONAA, GFRAA in the last 8760 hours.  HEMOGLOBIN A1C No results found for: HGBA1C, MPG  11/29/2020 Sodium 142, potassium 4, chloride 105, bicarb 29, BUN 16, creatinine 1.23. Magnesium 2.4. BNP 1351  11/22/2020: Hemoglobin 11.8 g/dL, hematocrit 36.6%  IMPRESSION:    ICD-10-CM   1. Acute heart failure with preserved ejection fraction (HFpEF) (HCC)  I50.31 EKG 12-Lead    empagliflozin (JARDIANCE) 10 MG TABS tablet    bumetanide (BUMEX) 1 MG tablet    Pro b natriuretic peptide (BNP)    CMP14+EGFR    ECHOCARDIOGRAM COMPLETE    2. Dyspnea on exertion  R06.00     3. Mild to moderate aortic stenosis  I35.0 ECHOCARDIOGRAM COMPLETE    4. Benign essential hypertension  I10     5. Mixed hyperlipidemia  E78.2     6. Diet-controlled diabetes mellitus ([place])  E11.9     7. Former smoker  Z87.891        RECOMMENDATIONS: Lucile Didonato is a 80 y.o. 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  Acute heart failure with preserved EF: Newly discovered. Stage B, NYHA class II/III For the last 3 weeks patient has been experiencing shortness of breath with effort related activities.  Initially treated with antibiotics for presumed pneumonia by PCP; however, due to continued elevated BNP now referred to cardiology on a stat basis for further evaluation and management of congestive heart failure. Clinically patient is in congestive heart failure with the symptoms of shortness of breath with effort related activities, lower extremity swelling, elevated BNP, and rales on examination Discontinue Lasix 40 mg p.o. twice daily. Start Jardiance 10 mg p.o. every morning. Start Bumex 1 mg p.o. every morning. Blood work in 1 week to evaluate kidney function and electrolytes. Low-salt diet recommended Strict I's and O's and daily weights. Echocardiogram will be ordered to evaluate for structural heart  disease and left ventricular systolic function.  Dyspnea on exertion: See above.  Aortic stenosis: Would like to recheck echocardiogram to reevaluate the severity of aortic stenosis. Patient denies any syncope or angina pectoris. Monitor for now.  Benign essential hypertension: Office blood pressures within acceptable range. Medications reconciled. Low-salt diet recommended.   Monitor blood pressures.  Former smoker: Educated on the importance of continued smoking cessation.  FINAL MEDICATION LIST END OF ENCOUNTER: Meds ordered this encounter  Medications   empagliflozin (JARDIANCE) 10 MG TABS tablet    Sig: Take 1 tablet (10 mg total) by mouth daily before breakfast.    Dispense:  30 tablet    Refill:  0   bumetanide (BUMEX) 1 MG tablet    Sig: Take 1 tablet (1 mg total) by mouth every morning.    Dispense:  30 tablet    Refill:  0    Medications Discontinued During This Encounter  Medication Reason   furosemide (LASIX) 20 MG tablet Change in therapy      Current Outpatient Medications:    aspirin 81 MG tablet, Take 81 mg by mouth daily., Disp: , Rfl:    atorvastatin (LIPITOR) 20 MG tablet, Take 40 mg by mouth daily. , Disp: , Rfl:    bumetanide (BUMEX) 1 MG tablet, Take 1 tablet (1 mg total) by mouth every morning., Disp: 30 tablet, Rfl: 0   empagliflozin (JARDIANCE) 10 MG TABS tablet, Take 1 tablet (10 mg total) by mouth daily before breakfast., Disp: 30 tablet, Rfl: 0   GuaiFENesin (MUCINEX PO), Take 2 tablets by mouth daily as needed., Disp: , Rfl:    hydrALAZINE (APRESOLINE) 25 MG tablet, TAKE 1 TABLET THREE TIMES DAILY, Disp: 270 tablet, Rfl: 0   losartan (COZAAR) 100 MG tablet, Take 100 mg by mouth daily., Disp: , Rfl:    LUTEIN PO, Take by mouth daily., Disp: , Rfl:    magnesium oxide (MAG-OX) 400 MG tablet, Take 400 mg by mouth daily., Disp: , Rfl:    metoprolol succinate (TOPROL-XL) 25 MG 24 hr tablet, TAKE 1 TABLET BY MOUTH ONCE DAILY. HOLD IF TOP BLOOD PRESSURE NUMBER IS LESS THAN 100 MMHG OR PILSE LES THAN 60 BPM, Disp: 30 tablet, Rfl: 3   Multiple Vitamins-Minerals (MULTIVITAMIN PO), Take by mouth daily., Disp: , Rfl:    naproxen (NAPROSYN) 500 MG tablet, TAKE ONE TABLET BY MOUTH EVERY 12 HOURS AS NEEDED (REPLACING INDOMETHACIN), Disp: , Rfl:    Omega-3 Fatty Acids (OMEGA 3 PO), Take 1,000 mg by mouth. 4 tablets daily, Disp: , Rfl:    OVER THE COUNTER MEDICATION, Take by mouth. Super Beta Prostate, Disp: , Rfl:    potassium chloride SA (KLOR-CON) 20 MEQ tablet, Take 20 mEq by mouth daily., Disp: , Rfl:   Orders Placed This Encounter  Procedures   Pro b natriuretic peptide (BNP)   CMP14+EGFR   EKG 12-Lead   ECHOCARDIOGRAM COMPLETE    There are no Patient Instructions on file for this visit.   --Continue cardiac medications as reconciled in final medication list. --Return in about 2 weeks (around 12/15/2020) for Follow up, Dyspnea. Or sooner if needed. --Continue follow-up with your primary care physician regarding the  management of your other chronic comorbid conditions.  Patient's questions and concerns were addressed to his satisfaction. He voices understanding of the instructions provided during this encounter.   This note was created using a voice recognition software as a result there may be grammatical errors inadvertently enclosed that do not reflect  the nature of this encounter. Every attempt is made to correct such errors.  Total time spent: 40 minutes obtaining history from the patient's wife, reviewing records in care everywhere including prior labs, chest x-ray results, calling PCPs office for medication reconciliation, discussing disease management, coordination of care, and educating on medications and side effects.  Rex Kras, [place], [facility]  Pager: [phone] Office: [phone]

## 2020-12-04 ENCOUNTER — Other Ambulatory Visit: Payer: Self-pay | Admitting: Cardiology

## 2020-12-04 DIAGNOSIS — I10 Essential (primary) hypertension: Secondary | ICD-10-CM

## 2020-12-07 ENCOUNTER — Other Ambulatory Visit: Payer: Self-pay

## 2020-12-07 ENCOUNTER — Other Ambulatory Visit: Payer: Medicare HMO

## 2020-12-07 DIAGNOSIS — I5031 Acute diastolic (congestive) heart failure: Secondary | ICD-10-CM

## 2020-12-07 DIAGNOSIS — I350 Nonrheumatic aortic (valve) stenosis: Secondary | ICD-10-CM

## 2020-12-09 LAB — CMP14+EGFR
ALT: 18 IU/L (ref 0–44)
AST: 24 IU/L (ref 0–40)
Albumin/Globulin Ratio: 1.7 (ref 1.2–2.2)
Albumin: 4.1 g/dL (ref 3.7–4.7)
Alkaline Phosphatase: 69 IU/L (ref 44–121)
BUN/Creatinine Ratio: 14 (ref 10–24)
BUN: 17 mg/dL (ref 8–27)
Bilirubin Total: 0.3 mg/dL (ref 0.0–1.2)
CO2: 21 mmol/L (ref 20–29)
Calcium: 8.8 mg/dL (ref 8.6–10.2)
Chloride: 102 mmol/L (ref 96–106)
Creatinine, Ser: 1.2 mg/dL (ref 0.76–1.27)
Globulin, Total: 2.4 g/dL (ref 1.5–4.5)
Glucose: 106 mg/dL — ABNORMAL HIGH (ref 65–99)
Potassium: 4.6 mmol/L (ref 3.5–5.2)
Sodium: 141 mmol/L (ref 134–144)
Total Protein: 6.5 g/dL (ref 6.0–8.5)
eGFR: 62 mL/min/{1.73_m2} (ref >59)

## 2020-12-09 LAB — PRO B NATRIURETIC PEPTIDE: NT-Pro BNP: 1668 pg/mL — ABNORMAL HIGH (ref 0–486)

## 2020-12-14 ENCOUNTER — Other Ambulatory Visit: Payer: Self-pay | Admitting: Cardiology

## 2020-12-14 DIAGNOSIS — I5041 Acute combined systolic (congestive) and diastolic (congestive) heart failure: Secondary | ICD-10-CM

## 2020-12-14 MED ORDER — ENTRESTO 49-51 MG PO TABS: 1.0000 | ORAL_TABLET | Freq: Two times a day (BID) | ORAL | 0 refills | Status: DC

## 2020-12-14 NOTE — Progress Notes (Signed)
Patient presented to the office for sooner evaluation for concerns of acute congestive heart failure.  Blood work notes elevated NT proBNP, lower extremity swelling, shortness of breath, and echocardiogram notes newly reduced LVEF with worsening of diastolic function, elevated left atrial pressures, and pulmonary hypertension based on RVSP.  Called both the patient and his wife today to review the results of the echo and labs.  For now would like to discontinue losartan 100 mg p.o. daily.  And transition him to Entresto 49/51 mg p.o. twice daily.  I have asked him to pick up a 30-day free voucher at the office.  He will need to get blood work 1 week after being on Entresto to evaluate his kidney function and electrolytes.  At the last office visit he was discontinued from Lasix and started on Bumex and since then he has experienced 7 pounds of weight loss.    ICD-10-CM   1. Acute combined systolic and diastolic CHF, NYHA class 3 (HCC)  I50.41 sacubitril-valsartan (ENTRESTO) 49-51 MG    Basic metabolic panel    Magnesium    Pro b natriuretic peptide (BNP)     Orders Placed This Encounter  Procedures   Basic metabolic panel    Standing Status:   Future    Standing Expiration Date:   12/14/2021   Magnesium    Standing Status:   Future    Standing Expiration Date:   12/14/2021   Pro b natriuretic peptide (BNP)    Standing Status:   Future    Standing Expiration Date:   03/15/2021   Telephone encounter: 10 minutes.  Rex Kras, [place], [facility]  Pager: [phone] Office: [phone]

## 2020-12-22 NOTE — Progress Notes (Signed)
Attempted to call pt, no answer. Left vm requesting call back.

## 2020-12-23 LAB — BASIC METABOLIC PANEL
BUN/Creatinine Ratio: 13 (ref 10–24)
BUN: 18 mg/dL (ref 8–27)
CO2: 22 mmol/L (ref 20–29)
Calcium: 9.4 mg/dL (ref 8.6–10.2)
Chloride: 103 mmol/L (ref 96–106)
Creatinine, Ser: 1.36 mg/dL — ABNORMAL HIGH (ref 0.76–1.27)
Glucose: 116 mg/dL — ABNORMAL HIGH (ref 70–99)
Potassium: 4.5 mmol/L (ref 3.5–5.2)
Sodium: 142 mmol/L (ref 134–144)
eGFR: 53 mL/min/{1.73_m2} — ABNORMAL LOW (ref >59)

## 2020-12-23 LAB — PRO B NATRIURETIC PEPTIDE: NT-Pro BNP: 1673 pg/mL — ABNORMAL HIGH (ref 0–486)

## 2020-12-23 LAB — MAGNESIUM: Magnesium: 2.2 mg/dL (ref 1.6–2.3)

## 2020-12-25 ENCOUNTER — Other Ambulatory Visit: Payer: Self-pay

## 2020-12-25 ENCOUNTER — Ambulatory Visit: Payer: Medicare HMO | Admitting: Cardiology

## 2020-12-25 ENCOUNTER — Encounter: Payer: Self-pay | Admitting: Cardiology

## 2020-12-25 VITALS — BP 153/92 | HR 86 | Temp 97.9°F | Resp 16 | Ht 66.0 in | Wt 170.2 lb

## 2020-12-25 DIAGNOSIS — I350 Nonrheumatic aortic (valve) stenosis: Secondary | ICD-10-CM

## 2020-12-25 DIAGNOSIS — E782 Mixed hyperlipidemia: Secondary | ICD-10-CM

## 2020-12-25 DIAGNOSIS — Z87891 Personal history of nicotine dependence: Secondary | ICD-10-CM

## 2020-12-25 DIAGNOSIS — I5041 Acute combined systolic (congestive) and diastolic (congestive) heart failure: Secondary | ICD-10-CM

## 2020-12-25 DIAGNOSIS — I10 Essential (primary) hypertension: Secondary | ICD-10-CM

## 2020-12-25 DIAGNOSIS — E119 Type 2 diabetes mellitus without complications: Secondary | ICD-10-CM

## 2020-12-25 DIAGNOSIS — R0609 Other forms of dyspnea: Secondary | ICD-10-CM

## 2020-12-25 MED ORDER — SPIRONOLACTONE 25 MG PO TABS: 25.0000 mg | ORAL_TABLET | Freq: Every day | ORAL | 0 refills | Status: DC

## 2020-12-25 MED ORDER — MAGNESIUM OXIDE 400 MG PO TABS: 400.0000 mg | ORAL_TABLET | Freq: Every day | ORAL | 11 refills | Status: DC

## 2020-12-25 MED ORDER — DAPAGLIFLOZIN PROPANEDIOL 10 MG PO TABS: 10.0000 mg | ORAL_TABLET | Freq: Every day | ORAL | 0 refills | Status: DC

## 2020-12-25 MED ORDER — ENTRESTO 97-103 MG PO TABS: 1.0000 | ORAL_TABLET | Freq: Two times a day (BID) | ORAL | 0 refills | Status: DC

## 2020-12-25 NOTE — Progress Notes (Signed)
Date:  12/25/2020   ID:  [name], DOB [birth year], MRN [id number]  PCP:  Aletha Halim., PA-C  Cardiologist:  Rex Kras, DO, [facility] (established care 06/10/2019) Former Cardiology Providers: Jeri Lager, APRN, FNP-C  Date: 12/25/20 Last Office Visit: 12/01/2020  Chief Complaint  Patient presents with   Shortness of Breath   Follow-up    HPI  Keith Wagner is a 80 y.o. male who presents to the office with a chief complaint of " heart failure management." Patient's past medical history and cardiovascular risk factors include: Heart failure with reduced EF, stage C, NYHA class III, moderate valvular heart disease, hypertension, diet-controlled diabetes melitis type II, hyperlipidemia, advanced age  He is referred to the office at the request of Deatra Ina, Kristen W., PA-C for evaluation of aortic stenosis and congestive heart failure.  Patient is accompanied by his wife at today's visit.  Patient is being followed for aortic stenosis management long-term presents to the office sooner than his scheduled appointment visit back in September 2022 due to new onset of shortness of breath, lower extremity swelling, and decreased functional status.  Patient was initially working with his PCP with regards to pneumonia management and diuresis; however, due to continued symptoms he was referred to cardiology for sooner appointment visit.  Since last office visit patient had an echocardiogram which notes moderately reduced LVEF with dilated LV cavity, grade 2 diastolic dysfunction with elevated left atrial pressure.  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   In the interim patient was started on Entresto and has tolerated the medication well without any side effects or intolerances.  He states that his shortness of breath has improved significantly and his lower extremity swelling is essentially resolved.  His  systolic SBP ranges between 120-140 mmHg.  And his weight remains relatively stable at home.  ALLERGIES: No Known Allergies  MEDICATION LIST PRIOR TO VISIT: Current Meds  Medication Sig   aspirin 81 MG tablet Take 81 mg by mouth daily.   atorvastatin (LIPITOR) 20 MG tablet Take 40 mg by mouth daily.    dapagliflozin propanediol (FARXIGA) 10 MG TABS tablet Take 1 tablet (10 mg total) by mouth daily before breakfast.   GuaiFENesin (MUCINEX PO) Take 2 tablets by mouth daily as needed.   LUTEIN PO Take by mouth daily.   metoprolol succinate (TOPROL-XL) 25 MG 24 hr tablet TAKE 1 TABLET BY MOUTH ONCE DAILY. HOLD IF TOP BLOOD PRESSURE NUMBER IS LESS THAN 100 MMHG OR PILSE LES THAN 60 BPM   Multiple Vitamins-Minerals (MULTIVITAMIN PO) Take by mouth daily.   naproxen (NAPROSYN) 500 MG tablet TAKE ONE TABLET BY MOUTH EVERY 12 HOURS AS NEEDED (REPLACING INDOMETHACIN)   Omega-3 Fatty Acids (OMEGA 3 PO) Take 1,000 mg by mouth. 4 tablets daily   OVER THE COUNTER MEDICATION Take by mouth. Super Beta Prostate   potassium chloride SA (KLOR-CON) 20 MEQ tablet Take 20 mEq by mouth daily.   sacubitril-valsartan (ENTRESTO) 97-103 MG Take 1 tablet by mouth 2 (two) times daily.   spironolactone (ALDACTONE) 25 MG tablet Take 1 tablet (25 mg total) by mouth daily at 10 pm.   [DISCONTINUED] bumetanide (BUMEX) 1 MG tablet Take 1 tablet (1 mg total) by mouth every morning.   [DISCONTINUED] empagliflozin (JARDIANCE) 10 MG TABS tablet Take 1 tablet (10 mg total) by mouth daily before breakfast.   [DISCONTINUED] hydrALAZINE (APRESOLINE) 25 MG tablet TAKE 1 TABLET THREE TIMES DAILY   [  DISCONTINUED] magnesium oxide (MAG-OX) 400 MG tablet Take 400 mg by mouth daily.   [DISCONTINUED] sacubitril-valsartan (ENTRESTO) 49-51 MG Take 1 tablet by mouth 2 (two) times daily.     PAST MEDICAL HISTORY: Past Medical History:  Diagnosis Date   Arthritis    Asthma    uses inhaler PRN   Bilateral carotid bruits 06/12/2018    Cataract    Diabetes mellitus without complication ([place])    per pt "pre-diabetic"- off Metformin- diet controlled    GERD (gastroesophageal reflux disease)    Gout    Heart murmur    Hypercholesteremia 06/12/2018   Hyperlipidemia    Hypertension    Mild aortic stenosis 06/12/2018    PAST SURGICAL HISTORY: Past Surgical History:  Procedure Laterality Date   CATARACT EXTRACTION Left    COLONOSCOPY     POLYPECTOMY      FAMILY HISTORY: The patient family history is not on file.  SOCIAL HISTORY:  The patient  reports that he has quit smoking. His smoking use included cigars. He has never used smokeless tobacco. He reports that he does not drink alcohol and does not use drugs.  REVIEW OF SYSTEMS: Review of Systems  Constitutional: Negative for chills and fever.  HENT:  Negative for hoarse voice and nosebleeds.   Eyes:  Negative for discharge, double vision and pain.  Cardiovascular:  Positive for dyspnea on exertion (improved). Negative for chest pain, claudication, leg swelling, near-syncope, orthopnea, palpitations, paroxysmal nocturnal dyspnea and syncope.  Respiratory:  Positive for shortness of breath. Negative for cough and hemoptysis.   Musculoskeletal:  Negative for muscle cramps and myalgias.  Gastrointestinal:  Negative for abdominal pain, constipation, diarrhea, hematemesis, hematochezia, melena, nausea and vomiting.  Neurological:  Negative for dizziness and light-headedness.   PHYSICAL EXAM: Vitals with BMI 12/25/2020 12/01/2020 07/03/2020  Height _0  _1  _2   Weight 170 lbs 3 oz 173 lbs 13 oz 178 lbs 13 oz  BMI 27.48 16.10 96.04  Systolic 540 981 191  Diastolic 92 89 78  Pulse 86 90 82    CONSTITUTIONAL: Well-developed and well-nourished. No acute distress.  SKIN: Skin is warm and dry. No rash noted. No cyanosis. No pallor. No jaundice HEAD: Normocephalic and atraumatic.  EYES: No scleral icterus MOUTH/THROAT: Moist oral membranes.  NECK: No JVD present. No  thyromegaly noted.  Bilateral carotid bruits  LYMPHATIC: No visible cervical adenopathy.  CHEST Normal respiratory effort. No intercostal retractions  LUNGS: Clear to auscultation in the upper lung fields with decreased breath sounds at the right lower lung base.  Minimal rales.  No stridor. No wheezes.  CARDIOVASCULAR: Regular, positive S1-S2, 3 out of 6 systolic ejection murmur heard at the second right costal space, soft holosystolic murmur heard at the apex, ABDOMINAL: Soft, nontender, nondistended, positive bowel sounds in all 4 quadrants no apparent ascites.  EXTREMITIES: Trace bilateral peripheral edema, 2+ DP and PT pulses. HEMATOLOGIC: No significant bruising NEUROLOGIC: Oriented to person, place, and time. Nonfocal. Normal muscle tone.  PSYCHIATRIC: Normal mood and affect. Normal behavior. Cooperative  CXR 11/22/2020: No significant change of small layering bilateral pleural effusions and mild patchy overlying airspace opacities.  CARDIAC DATABASE: EKG: 12/01/2020: Normal sinus rhythm, 94 bpm, left axis deviation, left anterior fascicular block, LVH, nonspecific T wave abnormality, PVCs, without underlying injury pattern.   Echocardiogram: 05/25/2019: LVEF 50-55%, moderate concentric LVH, grade 1 diastolic impairment, moderate MR, mild TR, RVSP 26 mmHg.  Aortic valve peak velocity of 2.8 m/s, mean gradient 16 mmHg, aortic  valve area by continuity equation 1.2 cm, dimensional index 0.3 suggestive of mild to moderate aortic stenosis.  12/07/2020: Moderately depressed LV systolic function with visual EF 30-35%. Left ventricle cavity is dilated. Moderate concentric hypertrophy of the left ventricle. Hypokinetic global wall motion. Doppler evidence of grade II (pseudonormal) diastolic dysfunction, elevated LAP.  Left atrial cavity is mildly dilated. Mild aortic stenosis (peak velocity 2.50ms, Peak gradient 250mg, Mean gradient 13101m, AVA 0.9cm2, DI 0.30) severity of AS may be under  appreciated due to low flow low gradient AS.  Moderate (Grade III) aortic regurgitation. Moderate (Grade II) mitral regurgitation. Mild tricuspid regurgitation. Mild pulmonary hypertension. RVSP measures 44 mmHg. IVC is normal with a respiratory response of <50%. Compared to study 05/25/2019: LVEF 50-55% is now 30-35%, G1DD is now G2DD with elevated LAP, PHTN is new., otherwise no significant change.    Stress Testing: Treadmill Exercise stress 11/11/2014: Indication: Diabetes, HTN The patient exercised according to [facility] Protocol, Total time recorded  6:00 min achieving max heart rate of  148 which was  100% of MPHR for age and  7.05 METS of work. Occasional PVC during exercise.  Normal BP response. Resting ECG showing NSR. There was no ST-T changes of ischemia with exercise stress test. No exercise induced arrythmias noted. Stress terminated [facility].  Heart Catheterization: None  Carotid artery duplex 12/04/2017: Minimal stenosis in the right internal carotid artery (1-15%). Minimal stenosis in the left internal carotid artery (1-15%). Antegrade right vertebral artery flow. Antegrade left vertebral artery flow. Follow up when clinically indicated.  LABORATORY DATA: No flowsheet data found.  CMP Latest Ref Rng & Units 12/22/2020 12/08/2020  Glucose 70 - 99 mg/dL 116(H) 106(H)  BUN 8 - 27 mg/dL 18 17  Creatinine 0.76 - 1.27 mg/dL 1.36(H) 1.20  Sodium 134 - 144 mmol/L 142 141  Potassium 3.5 - 5.2 mmol/L 4.5 4.6  Chloride 96 - 106 mmol/L 103 102  CO2 20 - 29 mmol/L 22 21  Calcium 8.6 - 10.2 mg/dL 9.4 8.8  Total Protein 6.0 - 8.5 g/dL - 6.5  Total Bilirubin 0.0 - 1.2 mg/dL - 0.3  Alkaline Phos 44 - 121 IU/L - 69  AST 0 - 40 IU/L - 24  ALT 0 - 44 IU/L - 18    Lipid Panel  No results found for: CHOL, TRIG, HDL, CHOLHDL, VLDL, LDLCALC, LDLDIRECT, LABVLDL  No components found for: NTPROBNP Recent Labs    12/08/20 0909 12/22/20 0833  PROBNP 1,668* 1,673*   No results for  input(s): TSH in the last 8760 hours.  BMP Recent Labs    12/08/20 0909 12/22/20 0833  NA 141 142  K 4.6 4.5  CL 102 103  CO2 21 22  GLUCOSE 106* 116*  BUN 17 18  CREATININE 1.20 1.36*  CALCIUM 8.8 9.4    HEMOGLOBIN A1C No results found for: HGBA1C, MPG  11/29/2020 Sodium 142, potassium 4, chloride 105, bicarb 29, BUN 16, creatinine 1.23. Magnesium 2.4. BNP 1351  11/22/2020: Hemoglobin 11.8 g/dL, hematocrit 36.6%  IMPRESSION:    ICD-10-CM   1. Acute combined systolic and diastolic CHF, NYHA class 3 (HCC)  I50.41 sacubitril-valsartan (ENTRESTO) 97-103 MG    spironolactone (ALDACTONE) 25 MG tablet    magnesium oxide (MAG-OX) 400 MG tablet    Basic metabolic panel    Magnesium    Pro b natriuretic peptide (BNP)    CBC    dapagliflozin propanediol (FARXIGA) 10 MG TABS tablet    2. Dyspnea on exertion  R06.09 sacubitril-valsartan (ENTRESTO) 97-103 MG    spironolactone (ALDACTONE) 25 MG tablet    3. Mild to moderate aortic stenosis  I35.0     4. Benign essential hypertension  I10     5. Mixed hyperlipidemia  E78.2     6. Diet-controlled diabetes mellitus ([place])  E11.9     7. Former smoker  Z87.891        RECOMMENDATIONS: Keith Wagner is a 80 y.o. male whose past medical history and cardiac risk factors include: Heart failure with reduced EF, stage C, NYHA class III, mild to moderate valvular heart disease, hypertension, diet-controlled diabetes melitis type II, hyperlipidemia, advanced age.  Acute combined systolic and diastolic CHF, NYHA class 3 (HCC) Acute exacerbation. Echocardiogram notes dilated LV cavity, global hypokinesis, calculated LVEF 30-35%, grade 2 DD, elevated LAP. Patient is currently on Entresto 49/51 mg p.o. twice daily.  Patient is asked to take 2 tablets in the morning and 2 tablets in the evening and to have blood work in 1 week.  If the kidney function and electrolytes remain stable we will send in a prescription for Entresto 97/103 mg p.o.  twice daily. Discontinue Bumex Discontinue hydralazine Change Jardiance to [facility] spironolactone 25 mg p.o. every afternoon Blood work in 1 week to evaluate kidney function and electrolytes. Given the new onset of cardiomyopathy once he is euvolemic would like to proceed with left and right heart catheterization to evaluate for obstructive CAD and hemodynamics respectively.  If he does not have any significant obstructive CAD it would be very reasonable to proceed with dobutamine stress echo plus or minus TAVR evaluation.  Further recommendations to follow.  Dyspnea on exertion Improving. See recommendations noted above.  Mild to moderate aortic stenosis Denies syncope or angina pectoris. However given the newly discovered heart failure we will proceed with additional work-up once he is euvolemic  Benign essential hypertension Home blood pressures are very well controlled. Continue current medical therapy as noted above. Low-salt diet recommended. Monitor for now.  Mixed hyperlipidemia Currently on atorvastatin.   He denies myalgia or other side effects. Currently managed by primary care provider.  FINAL MEDICATION LIST END OF ENCOUNTER: Meds ordered this encounter  Medications   sacubitril-valsartan (ENTRESTO) 97-103 MG    Sig: Take 1 tablet by mouth 2 (two) times daily.    Dispense:  60 tablet    Refill:  0   spironolactone (ALDACTONE) 25 MG tablet    Sig: Take 1 tablet (25 mg total) by mouth daily at 10 pm.    Dispense:  30 tablet    Refill:  0   magnesium oxide (MAG-OX) 400 MG tablet    Sig: Take 1 tablet (400 mg total) by mouth daily.    Dispense:  30 tablet    Refill:  11   dapagliflozin propanediol (FARXIGA) 10 MG TABS tablet    Sig: Take 1 tablet (10 mg total) by mouth daily before breakfast.    Dispense:  90 tablet    Refill:  0     Medications Discontinued During This Encounter  Medication Reason   hydrALAZINE (APRESOLINE) 25 MG tablet    bumetanide  (BUMEX) 1 MG tablet    sacubitril-valsartan (ENTRESTO) 49-51 MG Dose change   magnesium oxide (MAG-OX) 400 MG tablet Reorder   empagliflozin (JARDIANCE) 10 MG TABS tablet Change in therapy      Current Outpatient Medications:    aspirin 81 MG tablet, Take 81 mg by mouth daily., Disp: , Rfl:  atorvastatin (LIPITOR) 20 MG tablet, Take 40 mg by mouth daily. , Disp: , Rfl:    dapagliflozin propanediol (FARXIGA) 10 MG TABS tablet, Take 1 tablet (10 mg total) by mouth daily before breakfast., Disp: 90 tablet, Rfl: 0   GuaiFENesin (MUCINEX PO), Take 2 tablets by mouth daily as needed., Disp: , Rfl:    LUTEIN PO, Take by mouth daily., Disp: , Rfl:    metoprolol succinate (TOPROL-XL) 25 MG 24 hr tablet, TAKE 1 TABLET BY MOUTH ONCE DAILY. HOLD IF TOP BLOOD PRESSURE NUMBER IS LESS THAN 100 MMHG OR PILSE LES THAN 60 BPM, Disp: 30 tablet, Rfl: 3   Multiple Vitamins-Minerals (MULTIVITAMIN PO), Take by mouth daily., Disp: , Rfl:    naproxen (NAPROSYN) 500 MG tablet, TAKE ONE TABLET BY MOUTH EVERY 12 HOURS AS NEEDED (REPLACING INDOMETHACIN), Disp: , Rfl:    Omega-3 Fatty Acids (OMEGA 3 PO), Take 1,000 mg by mouth. 4 tablets daily, Disp: , Rfl:    OVER THE COUNTER MEDICATION, Take by mouth. Super Beta Prostate, Disp: , Rfl:    potassium chloride SA (KLOR-CON) 20 MEQ tablet, Take 20 mEq by mouth daily., Disp: , Rfl:    sacubitril-valsartan (ENTRESTO) 97-103 MG, Take 1 tablet by mouth 2 (two) times daily., Disp: 60 tablet, Rfl: 0   spironolactone (ALDACTONE) 25 MG tablet, Take 1 tablet (25 mg total) by mouth daily at 10 pm., Disp: 30 tablet, Rfl: 0   magnesium oxide (MAG-OX) 400 MG tablet, Take 1 tablet (400 mg total) by mouth daily., Disp: 30 tablet, Rfl: 11  Orders Placed This Encounter  Procedures   Basic metabolic panel   Magnesium   Pro b natriuretic peptide (BNP)   CBC     There are no Patient Instructions on file for this visit.   --Continue cardiac medications as reconciled in final  medication list. --Return in about 2 weeks (around 01/08/2021) for Follow up, heart failure management.. Or sooner if needed. --Continue follow-up with your primary care physician regarding the management of your other chronic comorbid conditions.  Patient's questions and concerns were addressed to his satisfaction. He voices understanding of the instructions provided during this encounter.   This note was created using a voice recognition software as a result there may be grammatical errors inadvertently enclosed that do not reflect the nature of this encounter. Every attempt is made to correct such errors.  Rex Kras, [place], [facility]  Pager: [phone] Office: [phone]

## 2020-12-26 NOTE — Progress Notes (Signed)
Called and spoke to pt, pt voiced understanding.

## 2021-01-01 ENCOUNTER — Other Ambulatory Visit: Payer: Self-pay | Admitting: Cardiology

## 2021-01-02 ENCOUNTER — Ambulatory Visit: Payer: Medicare HMO | Admitting: Cardiology

## 2021-01-02 LAB — BASIC METABOLIC PANEL
BUN/Creatinine Ratio: 12 (ref 10–24)
BUN: 16 mg/dL (ref 8–27)
CO2: 24 mmol/L (ref 20–29)
Calcium: 9.8 mg/dL (ref 8.6–10.2)
Chloride: 105 mmol/L (ref 96–106)
Creatinine, Ser: 1.31 mg/dL — ABNORMAL HIGH (ref 0.76–1.27)
Glucose: 103 mg/dL — ABNORMAL HIGH (ref 70–99)
Potassium: 5.3 mmol/L — ABNORMAL HIGH (ref 3.5–5.2)
Sodium: 142 mmol/L (ref 134–144)
eGFR: 55 mL/min/{1.73_m2} — ABNORMAL LOW (ref >59)

## 2021-01-02 LAB — CBC
Hematocrit: 38.1 % (ref 37.5–51.0)
Hemoglobin: 11.8 g/dL — ABNORMAL LOW (ref 13.0–17.7)
MCH: 24.4 pg — ABNORMAL LOW (ref 26.6–33.0)
MCHC: 31 g/dL — ABNORMAL LOW (ref 31.5–35.7)
MCV: 79 fL (ref 79–97)
Platelets: 355 10*3/uL (ref 150–450)
RBC: 4.84 x10E6/uL (ref 4.14–5.80)
RDW: 15.2 % (ref 11.6–15.4)
WBC: 10.3 10*3/uL (ref 3.4–10.8)

## 2021-01-02 LAB — MAGNESIUM: Magnesium: 2.3 mg/dL (ref 1.6–2.3)

## 2021-01-02 LAB — PRO B NATRIURETIC PEPTIDE: NT-Pro BNP: 1231 pg/mL — ABNORMAL HIGH (ref 0–486)

## 2021-01-08 ENCOUNTER — Other Ambulatory Visit: Payer: Self-pay | Admitting: Cardiology

## 2021-01-08 DIAGNOSIS — I5041 Acute combined systolic (congestive) and diastolic (congestive) heart failure: Secondary | ICD-10-CM

## 2021-01-09 NOTE — Progress Notes (Signed)
Patient is aware 

## 2021-01-10 ENCOUNTER — Other Ambulatory Visit: Payer: Self-pay

## 2021-01-10 DIAGNOSIS — I5041 Acute combined systolic (congestive) and diastolic (congestive) heart failure: Secondary | ICD-10-CM

## 2021-01-10 MED ORDER — DAPAGLIFLOZIN PROPANEDIOL 10 MG PO TABS
10.0000 mg | ORAL_TABLET | Freq: Every day | ORAL | 0 refills | Status: DC
Start: 2021-01-10 — End: 2021-01-22

## 2021-01-19 ENCOUNTER — Other Ambulatory Visit: Payer: Self-pay | Admitting: Cardiology

## 2021-01-19 DIAGNOSIS — I5031 Acute diastolic (congestive) heart failure: Secondary | ICD-10-CM

## 2021-01-22 ENCOUNTER — Ambulatory Visit: Payer: Medicare HMO | Admitting: Cardiology

## 2021-01-22 ENCOUNTER — Other Ambulatory Visit: Payer: Self-pay | Admitting: Cardiology

## 2021-01-22 ENCOUNTER — Other Ambulatory Visit: Payer: Self-pay

## 2021-01-22 ENCOUNTER — Encounter: Payer: Self-pay | Admitting: Cardiology

## 2021-01-22 VITALS — BP 182/93 | HR 58 | Resp 16 | Ht 66.0 in | Wt 172.6 lb

## 2021-01-22 DIAGNOSIS — I429 Cardiomyopathy, unspecified: Secondary | ICD-10-CM

## 2021-01-22 DIAGNOSIS — R0609 Other forms of dyspnea: Secondary | ICD-10-CM

## 2021-01-22 DIAGNOSIS — Z87891 Personal history of nicotine dependence: Secondary | ICD-10-CM

## 2021-01-22 DIAGNOSIS — I10 Essential (primary) hypertension: Secondary | ICD-10-CM

## 2021-01-22 DIAGNOSIS — I5042 Chronic combined systolic (congestive) and diastolic (congestive) heart failure: Secondary | ICD-10-CM

## 2021-01-22 DIAGNOSIS — E119 Type 2 diabetes mellitus without complications: Secondary | ICD-10-CM

## 2021-01-22 DIAGNOSIS — I350 Nonrheumatic aortic (valve) stenosis: Secondary | ICD-10-CM

## 2021-01-22 DIAGNOSIS — I5041 Acute combined systolic (congestive) and diastolic (congestive) heart failure: Secondary | ICD-10-CM

## 2021-01-22 DIAGNOSIS — E782 Mixed hyperlipidemia: Secondary | ICD-10-CM

## 2021-01-22 MED ORDER — DAPAGLIFLOZIN PROPANEDIOL 10 MG PO TABS: 10.0000 mg | ORAL_TABLET | Freq: Every day | ORAL | 0 refills | Status: AC

## 2021-01-22 MED ORDER — ENTRESTO 97-103 MG PO TABS: 1.0000 | ORAL_TABLET | Freq: Two times a day (BID) | ORAL | 0 refills | Status: DC

## 2021-01-22 NOTE — Progress Notes (Signed)
 Date:  01/22/2021   ID:  Keith Wagner, DOB 10/13/1940, MRN [id number]  PCP:  Kaplan, Kristen W., PA-C  Cardiologist:  Sunit Tolia, DO, FACC (established care 06/10/2019) Former Cardiology Providers: Ashton Kelley, APRN, FNP-C  Date: 01/22/21 Last Office Visit: 12/25/2020  Chief Complaint  Patient presents with   Acute combined systolic and diastolic CHF, NYHA class 3   Follow-up    HPI  Keith Wagner is a 80 y.o. male who presents to the office with a chief complaint of " heart failure management and aortic stenosis management." Patient's past medical history and cardiovascular risk factors include: Heart failure with reduced EF, stage C, NYHA class III, moderate valvular heart disease, hypertension, diet-controlled diabetes melitis type II, hyperlipidemia, advanced age  He is referred to the office at the request of Kaplan, Kristen W., PA-C for evaluation of aortic stenosis and congestive heart failure.  Patient is accompanied by his wife at today's visit.  Patient is being followed for aortic stenosis management long-term presents to the office sooner than his scheduled appointment visit back in September 2022 due to new onset of shortness of breath, lower extremity swelling, and decreased functional status.  Patient was initially working with his PCP with regards to pneumonia management and diuresis; however, due to continued symptoms he was referred to cardiology for sooner appointment visit.  Since last office visit patient had an echocardiogram which notes moderately reduced LVEF with dilated LV cavity, grade 2 diastolic dysfunction with elevated left atrial pressure.  Patient also has moderate aortic stenosis per dimensional index; however, I suspect that he also has a degree of low-flow low gradient aortic stenosis.  Since last office visit he was uptitrated on Entresto and started on Farxiga.  Repeat blood work from 01/01/2021 independently reviewed which notes relatively  stable renal function with hyperkalemia.  Since then he was instructed to stop potassium supplements.  However, he also ran out of Entresto and Farxiga over the last 5 to 6 days and it was not refilled.  Clinically he is stable.  He denies any shortness of breath at rest or with effort related activities.  Denies orthopnea, paroxysmal nocturnal dyspnea or lower extremity swelling.  Patient is home blood pressure and weight logs have been reviewed during today's encounter.  SBP ranges around 120-140 mmHg on average and diastolic blood pressures between 80/90 mmHg with pulse 60-75 bpm.  Patient's weight at home ranges between 163-165 pounds.    ALLERGIES: No Known Allergies  MEDICATION LIST PRIOR TO VISIT: Current Meds  Medication Sig   aspirin 81 MG tablet Take 81 mg by mouth daily.   atorvastatin (LIPITOR) 20 MG tablet Take 40 mg by mouth daily.    GuaiFENesin (MUCINEX PO) Take 2 tablets by mouth daily as needed.   LUTEIN PO Take by mouth daily.   magnesium oxide (MAG-OX) 400 MG tablet Take 1 tablet (400 mg total) by mouth daily.   metoprolol succinate (TOPROL-XL) 25 MG 24 hr tablet TAKE 1 TABLET BY MOUTH ONCE DAILY. HOLD IF TOP BLOOD PRESSURE NUMBER IS LESS THAN 100 MMHG OR PILSE LES THAN 60 BPM   Multiple Vitamins-Minerals (MULTIVITAMIN PO) Take by mouth daily.   Omega-3 Fatty Acids (OMEGA 3 PO) Take 1,000 mg by mouth. 4 tablets daily   OVER THE COUNTER MEDICATION Take by mouth. Super Beta Prostate   potassium chloride SA (KLOR-CON) 20 MEQ tablet Take 20 mEq by mouth daily.   spironolactone (ALDACTONE) 25 MG tablet Take 1 tablet (25 mg total) by   mouth daily at 10 pm.     PAST MEDICAL HISTORY: Past Medical History:  Diagnosis Date   Arthritis    Asthma    uses inhaler PRN   Bilateral carotid bruits 06/12/2018   Cataract    Diabetes mellitus without complication (HCC)    per pt "pre-diabetic"- off Metformin- diet controlled    GERD (gastroesophageal reflux disease)    Gout    Heart  murmur    Hypercholesteremia 06/12/2018   Hyperlipidemia    Hypertension    Mild aortic stenosis 06/12/2018    PAST SURGICAL HISTORY: Past Surgical History:  Procedure Laterality Date   CATARACT EXTRACTION Left    COLONOSCOPY     POLYPECTOMY      FAMILY HISTORY: The patient family history is not on file.  SOCIAL HISTORY:  The patient  reports that he has quit smoking. His smoking use included cigars. He has never used smokeless tobacco. He reports that he does not drink alcohol and does not use drugs.  REVIEW OF SYSTEMS: Review of Systems  Constitutional: Negative for chills and fever.  HENT:  Negative for hoarse voice and nosebleeds.   Eyes:  Negative for discharge, double vision and pain.  Cardiovascular:  Positive for dyspnea on exertion (improved). Negative for chest pain, claudication, leg swelling, near-syncope, orthopnea, palpitations, paroxysmal nocturnal dyspnea and syncope.  Respiratory:  Positive for shortness of breath. Negative for cough and hemoptysis.   Musculoskeletal:  Negative for muscle cramps and myalgias.  Gastrointestinal:  Negative for abdominal pain, constipation, diarrhea, hematemesis, hematochezia, melena, nausea and vomiting.  Neurological:  Negative for dizziness and light-headedness.   PHYSICAL EXAM: Vitals with BMI 01/22/2021 01/22/2021 12/25/2020  Height - 5' 6" 5' 6"  Weight - 172 lbs 10 oz 170 lbs 3 oz  BMI - 27.87 27.48  Systolic 182 195 153  Diastolic 93 103 92  Pulse 58 67 86    CONSTITUTIONAL: Well-developed and well-nourished. No acute distress.  SKIN: Skin is warm and dry. No rash noted. No cyanosis. No pallor. No jaundice HEAD: Normocephalic and atraumatic.  EYES: No scleral icterus MOUTH/THROAT: Moist oral membranes.  NECK: No JVD present. No thyromegaly noted.  Bilateral carotid bruits  LYMPHATIC: No visible cervical adenopathy.  CHEST Normal respiratory effort. No intercostal retractions  LUNGS: Clear to auscultation in the  upper lung fields with decreased breath sounds at the right lower lung base.  Minimal rales.  No stridor. No wheezes.  CARDIOVASCULAR: Regular, positive S1-S2, 3 out of 6 systolic ejection murmur heard at the second right costal space, soft holosystolic murmur heard at the apex, ABDOMINAL: Soft, nontender, nondistended, positive bowel sounds in all 4 quadrants no apparent ascites.  EXTREMITIES: Trace bilateral peripheral edema, 2+ DP and PT pulses. HEMATOLOGIC: No significant bruising NEUROLOGIC: Oriented to person, place, and time. Nonfocal. Normal muscle tone.  PSYCHIATRIC: Normal mood and affect. Normal behavior. Cooperative  CXR 11/22/2020: No significant change of small layering bilateral pleural effusions and mild patchy overlying airspace opacities.  CARDIAC DATABASE: EKG: 12/01/2020: Normal sinus rhythm, 94 bpm, left axis deviation, left anterior fascicular block, LVH, nonspecific T wave abnormality, PVCs, without underlying injury pattern.   Echocardiogram: 05/25/2019: LVEF 50-55%, moderate concentric LVH, grade 1 diastolic impairment, moderate MR, mild TR, RVSP 26 mmHg.  Aortic valve peak velocity of 2.8 m/s, mean gradient 16 mmHg, aortic valve area by continuity equation 1.2 cm, dimensional index 0.3 suggestive of mild to moderate aortic stenosis.  12/07/2020: Moderately depressed LV systolic function with visual EF 30-35%.   Left ventricle cavity is dilated. Moderate concentric hypertrophy of the left ventricle. Hypokinetic global wall motion. Doppler evidence of grade II (pseudonormal) diastolic dysfunction, elevated LAP.  Left atrial cavity is mildly dilated. Mild aortic stenosis (peak velocity 2.11ms, Peak gradient 276mg, Mean gradient 1377m, AVA 0.9cm2, DI 0.30) severity of AS may be under appreciated due to low flow low gradient AS.  Moderate (Grade III) aortic regurgitation. Moderate (Grade II) mitral regurgitation. Mild tricuspid regurgitation. Mild pulmonary hypertension.  RVSP measures 44 mmHg. IVC is normal with a respiratory response of <50%. Compared to study 05/25/2019: LVEF 50-55% is now 30-35%, G1DD is now G2DD with elevated LAP, PHTN is new., otherwise no significant change.    Stress Testing: Treadmill Exercise stress 11/11/2014: Indication: Diabetes, HTN The patient exercised according to Bruce Protocol, Total time recorded  6:00 min achieving max heart rate of  148 which was  100% of MPHR for age and  7.05 METS of work. Occasional PVC during exercise.  Normal BP response. Resting ECG showing NSR. There was no ST-T changes of ischemia with exercise stress test. No exercise induced arrythmias noted. [facility]  Heart Catheterization: None  Carotid artery duplex 12/04/2017: Minimal stenosis in the right internal carotid artery (1-15%). Minimal stenosis in the left internal carotid artery (1-15%). Antegrade right vertebral artery flow. Antegrade left vertebral artery flow. Follow up when clinically indicated.  LABORATORY DATA: CBC Latest Ref Rng & Units 01/01/2021  WBC 3.4 - 10.8 x10E3/uL 10.3  Hemoglobin 13.0 - 17.7 g/dL 11.8(L)  Hematocrit 37.5 - 51.0 % 38.1  Platelets 150 - 450 x10E3/uL 355    CMP Latest Ref Rng & Units 01/01/2021 12/22/2020 12/08/2020  Glucose 70 - 99 mg/dL 103(H) 116(H) 106(H)  BUN 8 - 27 mg/dL _0 Creatinine 0.76 - 1.27 mg/dL 1.31(H) 1.36(H) 1.20  Sodium 134 - 144 mmol/L 142 142 141  Potassium 3.5 - 5.2 mmol/L 5.3(H) 4.5 4.6  Chloride 96 - 106 mmol/L 105 103 102  CO2 20 - 29 mmol/L _1 Calcium 8.6 - 10.2 mg/dL 9.8 9.4 8.8  Total Protein 6.0 - 8.5 g/dL - - 6.5  Total Bilirubin 0.0 - 1.2 mg/dL - - 0.3  Alkaline Phos 44 - 121 IU/L - - 69  AST 0 - 40 IU/L - - 24  ALT 0 - 44 IU/L - - 18    Lipid Panel  No results found for: CHOL, TRIG, HDL, CHOLHDL, VLDL, LDLCALC, LDLDIRECT, LABVLDL  No components found for: NTPROBNP Recent Labs    12/08/20 0909 12/22/20 0833 01/01/21 0930   PROBNP 1,668* 1,673* 1,231*   No results for input(s): TSH in the last 8760 hours.  BMP Recent Labs    12/08/20 0909 12/22/20 0833 01/01/21 0930  NA 141 142 142  K 4.6 4.5 5.3*  CL 102 103 105  CO2 _2 GLUCOSE 106* 116* 103*  BUN _3 CREATININE 1.20 1.36* 1.31*  CALCIUM 8.8 9.4 9.8    HEMOGLOBIN A1C No results found for: HGBA1C, MPG  11/29/2020 Sodium 142, potassium 4, chloride 105, bicarb 29, BUN 16, creatinine 1.23. Magnesium 2.4. BNP 1351  11/22/2020: Hemoglobin 11.8 g/dL, hematocrit 36.6%  IMPRESSION:    ICD-10-CM   1. Chronic combined systolic and diastolic CHF, NYHA class 3 (HCC)  I50.42 dapagliflozin propanediol (FARXIGA) 10 MG TABS tablet    sacubitril-valsartan (ENTRESTO) 97-103 MG    2. Dyspnea on exertion  R06.09 sacubitril-valsartan (ENTRESTO) 97-103 MG  3. Moderate aortic stenosis by prior echocardiogram  I35.0     4. Benign essential hypertension  I10     5. Mixed hyperlipidemia  E78.2     6. Diet-controlled diabetes mellitus (HCC)  E11.9     7. Former smoker  Z87.891         RECOMMENDATIONS: Keith Wagner is a 80 y.o. male whose past medical history and cardiac risk factors include: Heart failure with reduced EF, stage C, NYHA class III, mild to moderate valvular heart disease, hypertension, diet-controlled diabetes melitis type II, hyperlipidemia, advanced age.  Chronic combined systolic and diastolic CHF, NYHA class 3 (HCC) Euvolemic. Echocardiogram notes moderately reduced LVEF, dilated LV cavity, global hypokinesis, grade 2 diastolic dysfunction, and elevated LAP. Since then his heart failure medications have been uptitrated to the maximally tolerated doses. For reasons unknown patient stopped taking Entresto and Farxiga once he ran out of the medications. Refill Farxiga and Entresto Labs in 1 week to evaluate kidney function and electrolytes  Cardiomyopathy: Medications reconciled Currently on maximally tolerated  guideline directed medical therapy  Dyspnea on exertion Improving. See above  Moderate aortic stenosis by prior echocardiogram Presented with heart failure exacerbation/newly diagnosed in September 2022 Now euvolemic. Denies angina pectoris, no heart failure symptoms, or arrhythmias. Scheduled for left and right heart catheterization. Schedule left and right heart catheterization for possible TAVR work-up. Recommend checking aortic valve using Langston catheter for hemodynamics. I suspect the severity of aortic stenosis may be underappreciated given his low flow state.  Based on a dimensional index of severity of aortic stenosis is moderate. Will most likely refer him to structural heart team for evaluation of TAVR based on the results of the cath.  The decision to proceed with dobutamine stress echo I will defer to structural heart team.  Benign essential hypertension Office blood pressures are not well controlled. Home blood pressures are very well controlled. Reviewed his blood pressure, pulse, and weight log summarized findings noted above Medications reconciled  Mixed hyperlipidemia Currently on atorvastatin.   He denies myalgia or other side effects.   FINAL MEDICATION LIST END OF ENCOUNTER: Meds ordered this encounter  Medications   dapagliflozin propanediol (FARXIGA) 10 MG TABS tablet    Sig: Take 1 tablet (10 mg total) by mouth daily before breakfast.    Dispense:  90 tablet    Refill:  0   sacubitril-valsartan (ENTRESTO) 97-103 MG    Sig: Take 1 tablet by mouth 2 (two) times daily.    Dispense:  60 tablet    Refill:  0    Medications Discontinued During This Encounter  Medication Reason   naproxen (NAPROSYN) 500 MG tablet Error   sacubitril-valsartan (ENTRESTO) 97-103 MG Reorder   dapagliflozin propanediol (FARXIGA) 10 MG TABS tablet Reorder    Current Outpatient Medications:    aspirin 81 MG tablet, Take 81 mg by mouth daily., Disp: , Rfl:    atorvastatin  (LIPITOR) 20 MG tablet, Take 40 mg by mouth daily. , Disp: , Rfl:    GuaiFENesin (MUCINEX PO), Take 2 tablets by mouth daily as needed., Disp: , Rfl:    LUTEIN PO, Take by mouth daily., Disp: , Rfl:    magnesium oxide (MAG-OX) 400 MG tablet, Take 1 tablet (400 mg total) by mouth daily., Disp: 30 tablet, Rfl: 11   metoprolol succinate (TOPROL-XL) 25 MG 24 hr tablet, TAKE 1 TABLET BY MOUTH ONCE DAILY. HOLD IF TOP BLOOD PRESSURE NUMBER IS LESS THAN 100 MMHG OR PILSE LES THAN 60   BPM, Disp: 30 tablet, Rfl: 3   Multiple Vitamins-Minerals (MULTIVITAMIN PO), Take by mouth daily., Disp: , Rfl:    Omega-3 Fatty Acids (OMEGA 3 PO), Take 1,000 mg by mouth. 4 tablets daily, Disp: , Rfl:    OVER THE COUNTER MEDICATION, Take by mouth. Super Beta Prostate, Disp: , Rfl:    potassium chloride SA (KLOR-CON) 20 MEQ tablet, Take 20 mEq by mouth daily., Disp: , Rfl:    spironolactone (ALDACTONE) 25 MG tablet, Take 1 tablet (25 mg total) by mouth daily at 10 pm., Disp: 30 tablet, Rfl: 0   dapagliflozin propanediol (FARXIGA) 10 MG TABS tablet, Take 1 tablet (10 mg total) by mouth daily before breakfast., Disp: 90 tablet, Rfl: 0   sacubitril-valsartan (ENTRESTO) 97-103 MG, Take 1 tablet by mouth 2 (two) times daily., Disp: 60 tablet, Rfl: 0  No orders of the defined types were placed in this encounter.  There are no Patient Instructions on file for this visit.   --Continue cardiac medications as reconciled in final medication list. --Return in about 3 weeks (around 02/12/2021) for Follow up, Post heart catheterization. Or sooner if needed. --Continue follow-up with your primary care physician regarding the management of your other chronic comorbid conditions.  Patient's questions and concerns were addressed to his satisfaction. He voices understanding of the instructions provided during this encounter.   This note was created using a voice recognition software as a result there may be grammatical errors inadvertently  enclosed that do not reflect the nature of this encounter. Every attempt is made to correct such errors.  Sunit Tolia, DO, FACC  Pager: [phone] Office: [phone]    

## 2021-01-23 ENCOUNTER — Other Ambulatory Visit: Payer: Self-pay

## 2021-01-23 MED ORDER — POTASSIUM CHLORIDE CRYS ER 20 MEQ PO TBCR: 20.0000 meq | EXTENDED_RELEASE_TABLET | Freq: Every day | ORAL | 3 refills | Status: DC

## 2021-01-25 ENCOUNTER — Other Ambulatory Visit: Payer: Self-pay | Admitting: Cardiology

## 2021-01-25 DIAGNOSIS — I5041 Acute combined systolic (congestive) and diastolic (congestive) heart failure: Secondary | ICD-10-CM

## 2021-01-25 DIAGNOSIS — R0609 Other forms of dyspnea: Secondary | ICD-10-CM

## 2021-01-31 LAB — BASIC METABOLIC PANEL
BUN/Creatinine Ratio: 15 (ref 10–24)
BUN: 20 mg/dL (ref 8–27)
CO2: 23 mmol/L (ref 20–29)
Calcium: 9.6 mg/dL (ref 8.6–10.2)
Chloride: 103 mmol/L (ref 96–106)
Creatinine, Ser: 1.32 mg/dL — ABNORMAL HIGH (ref 0.76–1.27)
Glucose: 91 mg/dL (ref 70–99)
Potassium: 5 mmol/L (ref 3.5–5.2)
Sodium: 139 mmol/L (ref 134–144)
eGFR: 55 mL/min/{1.73_m2} — ABNORMAL LOW (ref >59)

## 2021-01-31 LAB — CBC
Hematocrit: 41.2 % (ref 37.5–51.0)
Hemoglobin: 12.7 g/dL — ABNORMAL LOW (ref 13.0–17.7)
MCH: 24.1 pg — ABNORMAL LOW (ref 26.6–33.0)
MCHC: 30.8 g/dL — ABNORMAL LOW (ref 31.5–35.7)
MCV: 78 fL — ABNORMAL LOW (ref 79–97)
Platelets: 355 10*3/uL (ref 150–450)
RBC: 5.27 x10E6/uL (ref 4.14–5.80)
RDW: 15.4 % (ref 11.6–15.4)
WBC: 10.3 10*3/uL (ref 3.4–10.8)

## 2021-01-31 LAB — PRO B NATRIURETIC PEPTIDE: NT-Pro BNP: 412 pg/mL (ref 0–486)

## 2021-01-31 LAB — MAGNESIUM: Magnesium: 2.3 mg/dL (ref 1.6–2.3)

## 2021-02-02 NOTE — Progress Notes (Signed)
Spoke to patient he voiced understanding

## 2021-02-06 ENCOUNTER — Other Ambulatory Visit: Payer: Self-pay | Admitting: Cardiology

## 2021-02-06 ENCOUNTER — Encounter (HOSPITAL_COMMUNITY)
Admission: RE | Disposition: A | Discharge status: Home / Self Care | Payer: Self-pay | Source: Ambulatory Visit | Attending: Cardiology

## 2021-02-06 ENCOUNTER — Other Ambulatory Visit: Payer: Self-pay

## 2021-02-06 ENCOUNTER — Ambulatory Visit (HOSPITAL_COMMUNITY)
Admission: RE | Admit: 2021-02-06 | Discharge: 2021-02-06 | Disposition: A | Discharge status: Home / Self Care | Payer: Medicare HMO | Source: Ambulatory Visit | Attending: Cardiology | Admitting: Cardiology

## 2021-02-06 ENCOUNTER — Encounter (HOSPITAL_COMMUNITY): Payer: Self-pay | Admitting: Cardiology

## 2021-02-06 DIAGNOSIS — Z87891 Personal history of nicotine dependence: Secondary | ICD-10-CM | POA: Diagnosis not present

## 2021-02-06 DIAGNOSIS — I5042 Chronic combined systolic (congestive) and diastolic (congestive) heart failure: Secondary | ICD-10-CM | POA: Diagnosis not present

## 2021-02-06 DIAGNOSIS — I428 Other cardiomyopathies: Secondary | ICD-10-CM | POA: Diagnosis not present

## 2021-02-06 DIAGNOSIS — I350 Nonrheumatic aortic (valve) stenosis: Secondary | ICD-10-CM

## 2021-02-06 DIAGNOSIS — E119 Type 2 diabetes mellitus without complications: Secondary | ICD-10-CM | POA: Diagnosis not present

## 2021-02-06 DIAGNOSIS — R0602 Shortness of breath: Secondary | ICD-10-CM | POA: Diagnosis not present

## 2021-02-06 DIAGNOSIS — E782 Mixed hyperlipidemia: Secondary | ICD-10-CM | POA: Insufficient documentation

## 2021-02-06 DIAGNOSIS — I5020 Unspecified systolic (congestive) heart failure: Secondary | ICD-10-CM | POA: Diagnosis present

## 2021-02-06 DIAGNOSIS — I110 Hypertensive heart disease with heart failure: Secondary | ICD-10-CM | POA: Insufficient documentation

## 2021-02-06 DIAGNOSIS — I2510 Atherosclerotic heart disease of native coronary artery without angina pectoris: Secondary | ICD-10-CM | POA: Insufficient documentation

## 2021-02-06 HISTORY — PX: RIGHT HEART CATH AND CORONARY ANGIOGRAPHY: CATH118264

## 2021-02-06 HISTORY — PX: THORACIC AORTOGRAM: CATH118269

## 2021-02-06 LAB — POCT I-STAT 7, (LYTES, BLD GAS, ICA,H+H)
Acid-base deficit: 3 mmol/L — ABNORMAL HIGH (ref 0.0–2.0)
Bicarbonate: 21.1 mmol/L (ref 20.0–28.0)
Calcium, Ion: 1.22 mmol/L (ref 1.15–1.40)
HCT: 37 % — ABNORMAL LOW (ref 39.0–52.0)
Hemoglobin: 12.6 g/dL — ABNORMAL LOW (ref 13.0–17.0)
O2 Saturation: 97 %
Potassium: 4.2 mmol/L (ref 3.5–5.1)
Sodium: 141 mmol/L (ref 135–145)
TCO2: 22 mmol/L (ref 22–32)
pCO2 arterial: 35.7 mmHg (ref 32.0–48.0)
pH, Arterial: 7.38 (ref 7.350–7.450)
pO2, Arterial: 93 mmHg (ref 83.0–108.0)

## 2021-02-06 LAB — GLUCOSE, CAPILLARY: Glucose-Capillary: 104 mg/dL — ABNORMAL HIGH (ref 70–99)

## 2021-02-06 LAB — POCT I-STAT EG7
Acid-base deficit: 5 mmol/L — ABNORMAL HIGH (ref 0.0–2.0)
Bicarbonate: 20.9 mmol/L (ref 20.0–28.0)
Calcium, Ion: 1.02 mmol/L — ABNORMAL LOW (ref 1.15–1.40)
HCT: 34 % — ABNORMAL LOW (ref 39.0–52.0)
Hemoglobin: 11.6 g/dL — ABNORMAL LOW (ref 13.0–17.0)
O2 Saturation: 68 %
Potassium: 3.7 mmol/L (ref 3.5–5.1)
Sodium: 142 mmol/L (ref 135–145)
TCO2: 22 mmol/L (ref 22–32)
pCO2, Ven: 39.9 mmHg — ABNORMAL LOW (ref 44.0–60.0)
pH, Ven: 7.326 (ref 7.250–7.430)
pO2, Ven: 38 mmHg (ref 32.0–45.0)

## 2021-02-06 SURGERY — RIGHT HEART CATH AND CORONARY ANGIOGRAPHY
Anesthesia: LOCAL

## 2021-02-06 MED ORDER — ACETAMINOPHEN 325 MG PO TABS: 650.0000 mg | ORAL_TABLET | ORAL | Status: DC | PRN

## 2021-02-06 MED ORDER — SODIUM CHLORIDE 0.9 % IV SOLN: INTRAVENOUS | Status: DC

## 2021-02-06 MED ORDER — SODIUM CHLORIDE 0.9% FLUSH: 3.0000 mL | INTRAVENOUS | Status: DC | PRN

## 2021-02-06 MED ORDER — IOHEXOL 350 MG/ML SOLN
INTRAVENOUS | Status: DC | PRN
  Administered 2021-02-06: 90 mL via INTRA_ARTERIAL

## 2021-02-06 MED ORDER — SODIUM CHLORIDE 0.9% FLUSH: 3.0000 mL | Freq: Two times a day (BID) | INTRAVENOUS | Status: DC

## 2021-02-06 MED ORDER — MIDAZOLAM HCL 2 MG/2ML IJ SOLN
INTRAMUSCULAR | Status: AC
  Filled 2021-02-06: qty 2

## 2021-02-06 MED ORDER — SODIUM CHLORIDE 0.9 % IV SOLN: 250.0000 mL | INTRAVENOUS | Status: DC | PRN

## 2021-02-06 MED ORDER — VERAPAMIL HCL 2.5 MG/ML IV SOLN
INTRAVENOUS | Status: AC
  Filled 2021-02-06: qty 2

## 2021-02-06 MED ORDER — FENTANYL CITRATE (PF) 100 MCG/2ML IJ SOLN
INTRAMUSCULAR | Status: AC
  Filled 2021-02-06: qty 2

## 2021-02-06 MED ORDER — HEPARIN SODIUM (PORCINE) 1000 UNIT/ML IJ SOLN
INTRAMUSCULAR | Status: DC | PRN
  Administered 2021-02-06: 3500 [IU] via INTRAVENOUS

## 2021-02-06 MED ORDER — VERAPAMIL HCL 2.5 MG/ML IV SOLN
INTRAVENOUS | Status: DC | PRN
  Administered 2021-02-06: 10 mL via INTRA_ARTERIAL

## 2021-02-06 MED ORDER — ONDANSETRON HCL 4 MG/2ML IJ SOLN: 4.0000 mg | Freq: Four times a day (QID) | INTRAMUSCULAR | Status: DC | PRN

## 2021-02-06 MED ORDER — LABETALOL HCL 5 MG/ML IV SOLN: 10.0000 mg | INTRAVENOUS | Status: DC | PRN

## 2021-02-06 MED ORDER — ASPIRIN 81 MG PO CHEW
81.0000 mg | CHEWABLE_TABLET | ORAL | Status: AC
  Administered 2021-02-06: 81 mg via ORAL
  Filled 2021-02-06: qty 1

## 2021-02-06 MED ORDER — HYDRALAZINE HCL 20 MG/ML IJ SOLN: 10.0000 mg | INTRAMUSCULAR | Status: DC | PRN

## 2021-02-06 MED ORDER — HEPARIN (PORCINE) IN NACL 1000-0.9 UT/500ML-% IV SOLN
INTRAVENOUS | Status: DC | PRN
  Administered 2021-02-06 (×2): 500 mL

## 2021-02-06 MED ORDER — MIDAZOLAM HCL 2 MG/2ML IJ SOLN
INTRAMUSCULAR | Status: DC | PRN
  Administered 2021-02-06: 1 mg via INTRAVENOUS

## 2021-02-06 MED ORDER — HEPARIN (PORCINE) IN NACL 1000-0.9 UT/500ML-% IV SOLN
INTRAVENOUS | Status: AC
  Filled 2021-02-06: qty 1000

## 2021-02-06 MED ORDER — HEPARIN SODIUM (PORCINE) 1000 UNIT/ML IJ SOLN
INTRAMUSCULAR | Status: AC
  Filled 2021-02-06: qty 1

## 2021-02-06 MED ORDER — LIDOCAINE HCL (PF) 1 % IJ SOLN
INTRAMUSCULAR | Status: DC | PRN
  Administered 2021-02-06 (×2): 2 mL

## 2021-02-06 MED ORDER — FENTANYL CITRATE (PF) 100 MCG/2ML IJ SOLN
INTRAMUSCULAR | Status: DC | PRN
  Administered 2021-02-06: 25 ug via INTRAVENOUS

## 2021-02-06 MED ORDER — LIDOCAINE HCL (PF) 1 % IJ SOLN
INTRAMUSCULAR | Status: AC
  Filled 2021-02-06: qty 30

## 2021-02-06 SURGICAL SUPPLY — 18 items
CATH 5FR JL3.5 JR4 ANG PIG MP (CATHETERS) ×1 IMPLANT
CATH BALLN WEDGE 5F 110CM (CATHETERS) ×1 IMPLANT
CATH INFINITI 5 FR 3DRC (CATHETERS) ×1 IMPLANT
CATH INFINITI 5 FR AR1 MOD (CATHETERS) ×1 IMPLANT
CATH INFINITI 5 FR AR2 MOD (CATHETERS) ×1 IMPLANT
CATH INFINITI 5 FR RCB (CATHETERS) ×1 IMPLANT
CATH INFINITI 5FR AL1 (CATHETERS) ×1 IMPLANT
DEVICE RAD COMP TR BAND LRG (VASCULAR PRODUCTS) ×1 IMPLANT
GLIDESHEATH SLEND A-KIT 6F 22G (SHEATH) ×1 IMPLANT
GUIDEWIRE .025 260CM (WIRE) ×1 IMPLANT
GUIDEWIRE INQWIRE 1.5J.035X260 (WIRE) IMPLANT
INQWIRE 1.5J .035X260CM (WIRE) ×2
KIT HEART LEFT (KITS) ×2 IMPLANT
PACK CARDIAC CATHETERIZATION (CUSTOM PROCEDURE TRAY) ×2 IMPLANT
SHEATH GLIDE SLENDER 4/5FR (SHEATH) ×1 IMPLANT
SYR MEDRAD MARK 7 150ML (SYRINGE) ×1 IMPLANT
TRANSDUCER W/STOPCOCK (MISCELLANEOUS) ×2 IMPLANT
TUBING CIL FLEX 10 FLL-RA (TUBING) ×2 IMPLANT

## 2021-02-06 NOTE — Interval H&P Note (Signed)
History and Physical Interval Note:  02/06/2021 7:48 AM  Keith Wagner  has presented today for surgery, with the diagnosis of shortness of breath aortic stenosis.  The various methods of treatment have been discussed with the patient and family. After consideration of risks, benefits and other options for treatment, the patient has consented to  Procedure(s): RIGHT/LEFT HEART CATH AND CORONARY ANGIOGRAPHY (N/A) as a surgical intervention.  The patient's history has been reviewed, patient examined, no change in status, stable for surgery.  I have reviewed the patient's chart and labs.  Questions were answered to the patient's satisfaction.    2012 Appropriate Use Criteria for Diagnostic Catheterization Cardiomyopathies (Right and Left Heart Catheterization OR Right Heart Catheterization Alone With/Without Left Ventriculography and Coronary Angiography) Indication:  Known or suspected cardiomyopathy with or without heart failure A (7) Indication: 93; Score 7   Keith Wagner J Filippa Yarbough

## 2021-02-06 NOTE — H&P (Signed)
OV 01/22/2021 copied for documentation    Date:  01/22/2021   ID:  Keith Wagner, DOB 08-Feb-1941, MRN [id number]  PCP:  Aletha Halim., PA-C  Cardiologist:  Rex Kras, DO, [facility] (established care 06/10/2019) Former Cardiology Providers: Jeri Lager, APRN, FNP-C  Date: 01/22/21 Last Office Visit: 12/25/2020  Chief Complaint  Patient presents with   Acute combined systolic and diastolic CHF, NYHA class 3   Follow-up    HPI  Keith Wagner is a 80 y.o. male who presents to the office with a chief complaint of " heart failure management and aortic stenosis management." Patient's past medical history and cardiovascular risk factors include: Heart failure with reduced EF, stage C, NYHA class III, moderate valvular heart disease, hypertension, diet-controlled diabetes melitis type II, hyperlipidemia, advanced age  He is referred to the office at the request of Deatra Ina, Kristen W., PA-C for evaluation of aortic stenosis and congestive heart failure.  Patient is accompanied by his wife at today's visit.  Patient is being followed for aortic stenosis management long-term presents to the office sooner than his scheduled appointment visit back in September 2022 due to new onset of shortness of breath, lower extremity swelling, and decreased functional status.  Patient was initially working with his PCP with regards to pneumonia management and diuresis; however, due to continued symptoms he was referred to cardiology for sooner appointment visit.  Since last office visit patient had an echocardiogram which notes moderately reduced LVEF with dilated LV cavity, grade 2 diastolic dysfunction with elevated left atrial pressure.  Patient also has moderate aortic stenosis per dimensional index; however, I suspect that he also has a degree of low-flow low gradient aortic stenosis.  Since last office visit he was uptitrated on Entresto and started on Farxiga.  Repeat blood work from 01/01/2021  independently reviewed which notes relatively stable renal function with hyperkalemia.  Since then he was instructed to stop potassium supplements.  However, he also ran out of [place] over the last 5 to 6 days and it was not refilled.  Clinically he is stable.  He denies any shortness of breath at rest or with effort related activities.  Denies orthopnea, paroxysmal nocturnal dyspnea or lower extremity swelling.  Patient is home blood pressure and weight logs have been reviewed during today's encounter.  SBP ranges around 120-140 mmHg on average and diastolic blood pressures between 80/90 mmHg with pulse 60-75 bpm.  Patient's weight at home ranges between 163-165 pounds.    ALLERGIES: No Known Allergies  MEDICATION LIST PRIOR TO VISIT: Current Meds  Medication Sig   aspirin 81 MG tablet Take 81 mg by mouth daily.   atorvastatin (LIPITOR) 20 MG tablet Take 40 mg by mouth daily.    GuaiFENesin (MUCINEX PO) Take 2 tablets by mouth daily as needed.   LUTEIN PO Take by mouth daily.   magnesium oxide (MAG-OX) 400 MG tablet Take 1 tablet (400 mg total) by mouth daily.   metoprolol succinate (TOPROL-XL) 25 MG 24 hr tablet TAKE 1 TABLET BY MOUTH ONCE DAILY. HOLD IF TOP BLOOD PRESSURE NUMBER IS LESS THAN 100 MMHG OR PILSE LES THAN 60 BPM   Multiple Vitamins-Minerals (MULTIVITAMIN PO) Take by mouth daily.   Omega-3 Fatty Acids (OMEGA 3 PO) Take 1,000 mg by mouth. 4 tablets daily   OVER THE COUNTER MEDICATION Take by mouth. Super Beta Prostate   potassium chloride SA (KLOR-CON) 20 MEQ tablet Take 20 mEq by mouth daily.   spironolactone (ALDACTONE) 25 MG tablet  Take 1 tablet (25 mg total) by mouth daily at 10 pm.     PAST MEDICAL HISTORY: Past Medical History:  Diagnosis Date   Arthritis    Asthma    uses inhaler PRN   Bilateral carotid bruits 06/12/2018   Cataract    Diabetes mellitus without complication ([place])    per pt "pre-diabetic"- off Metformin- diet controlled    GERD  (gastroesophageal reflux disease)    Gout    Heart murmur    Hypercholesteremia 06/12/2018   Hyperlipidemia    Hypertension    Mild aortic stenosis 06/12/2018    PAST SURGICAL HISTORY: Past Surgical History:  Procedure Laterality Date   CATARACT EXTRACTION Left    COLONOSCOPY     POLYPECTOMY      FAMILY HISTORY: The patient family history is not on file.  SOCIAL HISTORY:  The patient  reports that he has quit smoking. His smoking use included cigars. He has never used smokeless tobacco. He reports that he does not drink alcohol and does not use drugs.  REVIEW OF SYSTEMS: Review of Systems  Constitutional: Negative for chills and fever.  HENT:  Negative for hoarse voice and nosebleeds.   Eyes:  Negative for discharge, double vision and pain.  Cardiovascular:  Positive for dyspnea on exertion (improved). Negative for chest pain, claudication, leg swelling, near-syncope, orthopnea, palpitations, paroxysmal nocturnal dyspnea and syncope.  Respiratory:  Positive for shortness of breath. Negative for cough and hemoptysis.   Musculoskeletal:  Negative for muscle cramps and myalgias.  Gastrointestinal:  Negative for abdominal pain, constipation, diarrhea, hematemesis, hematochezia, melena, nausea and vomiting.  Neurological:  Negative for dizziness and light-headedness.   PHYSICAL EXAM: Vitals with BMI 01/22/2021 01/22/2021 12/25/2020  Height - $Remove'5\' 6"'XhMrtYb$  $RemoveB'5\' 6"'pwKSGZOY$   Weight - 172 lbs 10 oz 170 lbs 3 oz  BMI - 93.57 01.77  Systolic 939 030 092  Diastolic 93 330 92  Pulse 58 67 86    CONSTITUTIONAL: Well-developed and well-nourished. No acute distress.  SKIN: Skin is warm and dry. No rash noted. No cyanosis. No pallor. No jaundice HEAD: Normocephalic and atraumatic.  EYES: No scleral icterus MOUTH/THROAT: Moist oral membranes.  NECK: No JVD present. No thyromegaly noted.  Bilateral carotid bruits  LYMPHATIC: No visible cervical adenopathy.  CHEST Normal respiratory effort. No intercostal  retractions  LUNGS: Clear to auscultation in the upper lung fields with decreased breath sounds at the right lower lung base.  Minimal rales.  No stridor. No wheezes.  CARDIOVASCULAR: Regular, positive S1-S2, 3 out of 6 systolic ejection murmur heard at the second right costal space, soft holosystolic murmur heard at the apex, ABDOMINAL: Soft, nontender, nondistended, positive bowel sounds in all 4 quadrants no apparent ascites.  EXTREMITIES: Trace bilateral peripheral edema, 2+ DP and PT pulses. HEMATOLOGIC: No significant bruising NEUROLOGIC: Oriented to person, place, and time. Nonfocal. Normal muscle tone.  PSYCHIATRIC: Normal mood and affect. Normal behavior. Cooperative  CXR 11/22/2020: No significant change of small layering bilateral pleural effusions and mild patchy overlying airspace opacities.  CARDIAC DATABASE: EKG: 12/01/2020: Normal sinus rhythm, 94 bpm, left axis deviation, left anterior fascicular block, LVH, nonspecific T wave abnormality, PVCs, without underlying injury pattern.   Echocardiogram: 05/25/2019: LVEF 50-55%, moderate concentric LVH, grade 1 diastolic impairment, moderate MR, mild TR, RVSP 26 mmHg.  Aortic valve peak velocity of 2.8 m/s, mean gradient 16 mmHg, aortic valve area by continuity equation 1.2 cm, dimensional index 0.3 suggestive of mild to moderate aortic stenosis.  12/07/2020: Moderately depressed  LV systolic function with visual EF 30-35%. Left ventricle cavity is dilated. Moderate concentric hypertrophy of the left ventricle. Hypokinetic global wall motion. Doppler evidence of grade II (pseudonormal) diastolic dysfunction, elevated LAP.  Left atrial cavity is mildly dilated. Mild aortic stenosis (peak velocity 2.36m/s, Peak gradient 65mmHg, Mean gradient 65mmHg, AVA 0.9cm2, DI 0.30) severity of AS may be under appreciated due to low flow low gradient AS.  Moderate (Grade III) aortic regurgitation. Moderate (Grade II) mitral regurgitation. Mild  tricuspid regurgitation. Mild pulmonary hypertension. RVSP measures 44 mmHg. IVC is normal with a respiratory response of <50%. Compared to study 05/25/2019: LVEF 50-55% is now 30-35%, G1DD is now G2DD with elevated LAP, PHTN is new., otherwise no significant change.    Stress Testing: Treadmill Exercise stress 11/11/2014: Indication: Diabetes, HTN The patient exercised according to [place] Protocol, Total time recorded  6:00 min achieving max heart rate of  148 which was  100% of MPHR for age and  7.05 METS of work. Occasional PVC during exercise.  Normal BP response. Resting ECG showing NSR. There was no ST-T changes of ischemia with exercise stress test. No exercise induced arrythmias noted. Stress terminated due to [facility] met.  Heart Catheterization: None  Carotid artery duplex 12/04/2017: Minimal stenosis in the right internal carotid artery (1-15%). Minimal stenosis in the left internal carotid artery (1-15%). Antegrade right vertebral artery flow. Antegrade left vertebral artery flow. Follow up when clinically indicated.  LABORATORY DATA: CBC Latest Ref Rng & Units 01/01/2021  WBC 3.4 - 10.8 x10E3/uL 10.3  Hemoglobin 13.0 - 17.7 g/dL 11.8(L)  Hematocrit 37.5 - 51.0 % 38.1  Platelets 150 - 450 x10E3/uL 355    CMP Latest Ref Rng & Units 01/01/2021 12/22/2020 12/08/2020  Glucose 70 - 99 mg/dL 103(H) 116(H) 106(H)  BUN 8 - 27 mg/dL $Remove'16 18 17  'MgYbATC$ Creatinine 0.76 - 1.27 mg/dL 1.31(H) 1.36(H) 1.20  Sodium 134 - 144 mmol/L 142 142 141  Potassium 3.5 - 5.2 mmol/L 5.3(H) 4.5 4.6  Chloride 96 - 106 mmol/L 105 103 102  CO2 20 - 29 mmol/L $RemoveB'24 22 21  'MrufjZFh$ Calcium 8.6 - 10.2 mg/dL 9.8 9.4 8.8  Total Protein 6.0 - 8.5 g/dL - - 6.5  Total Bilirubin 0.0 - 1.2 mg/dL - - 0.3  Alkaline Phos 44 - 121 IU/L - - 69  AST 0 - 40 IU/L - - 24  ALT 0 - 44 IU/L - - 18    Lipid Panel  No results found for: CHOL, TRIG, HDL, CHOLHDL, VLDL, LDLCALC, LDLDIRECT, LABVLDL  No components found for: NTPROBNP Recent Labs     12/08/20 0909 12/22/20 0833 01/01/21 0930  PROBNP 1,668* 1,673* 1,231*   No results for input(s): TSH in the last 8760 hours.  BMP Recent Labs    12/08/20 0909 12/22/20 0833 01/01/21 0930  NA 141 142 142  K 4.6 4.5 5.3*  CL 102 103 105  CO2 $Re'21 22 24  'deE$ GLUCOSE 106* 116* 103*  BUN $Re'17 18 16  'HqP$ CREATININE 1.20 1.36* 1.31*  CALCIUM 8.8 9.4 9.8    HEMOGLOBIN A1C No results found for: HGBA1C, MPG  11/29/2020 Sodium 142, potassium 4, chloride 105, bicarb 29, BUN 16, creatinine 1.23. Magnesium 2.4. BNP 1351  11/22/2020: Hemoglobin 11.8 g/dL, hematocrit 36.6%  IMPRESSION:    ICD-10-CM   1. Chronic combined systolic and diastolic CHF, NYHA class 3 (HCC)  I50.42 dapagliflozin propanediol (FARXIGA) 10 MG TABS tablet    sacubitril-valsartan (ENTRESTO) 97-103 MG    2. Dyspnea on exertion  R06.09 sacubitril-valsartan (ENTRESTO) 97-103 MG    3. Moderate aortic stenosis by prior echocardiogram  I35.0     4. Benign essential hypertension  I10     5. Mixed hyperlipidemia  E78.2     6. Diet-controlled diabetes mellitus ([place])  E11.9     7. Former smoker  Z87.891         RECOMMENDATIONS: Shloima Clinch is a 80 y.o. male whose past medical history and cardiac risk factors include: Heart failure with reduced EF, stage C, NYHA class III, mild to moderate valvular heart disease, hypertension, diet-controlled diabetes melitis type II, hyperlipidemia, advanced age.  Chronic combined systolic and diastolic CHF, NYHA class 3 (HCC) Euvolemic. Echocardiogram notes moderately reduced LVEF, dilated LV cavity, global hypokinesis, grade 2 diastolic dysfunction, and elevated LAP. Since then his heart failure medications have been uptitrated to the maximally tolerated doses. For reasons unknown patient stopped taking Entresto and [place] once he ran out of the medications. Refill [facility] [employer] and [employer] in 1 week to evaluate kidney function and electrolytes  Cardiomyopathy: Medications  reconciled Currently on maximally tolerated guideline directed medical therapy  Dyspnea on exertion Improving. See above  Moderate aortic stenosis by prior echocardiogram Presented with heart failure exacerbation/newly diagnosed in September 2022 Now euvolemic. Denies angina pectoris, no heart failure symptoms, or arrhythmias. Scheduled for left and right heart catheterization. Schedule left and right heart catheterization for possible TAVR work-up. Recommend checking aortic valve using [place] catheter for hemodynamics. I suspect the severity of aortic stenosis may be underappreciated given his low flow state.  Based on a dimensional index of severity of aortic stenosis is moderate. Will most likely refer him to structural heart team for evaluation of TAVR based on the results of the cath.  The decision to proceed with dobutamine stress echo I will defer to structural heart team.  Benign essential hypertension Office blood pressures are not well controlled. Home blood pressures are very well controlled. Reviewed his blood pressure, pulse, and weight log summarized findings noted above Medications reconciled  Mixed hyperlipidemia Currently on atorvastatin.   He denies myalgia or other side effects.   FINAL MEDICATION LIST END OF ENCOUNTER: Meds ordered this encounter  Medications   dapagliflozin propanediol (FARXIGA) 10 MG TABS tablet    Sig: Take 1 tablet (10 mg total) by mouth daily before breakfast.    Dispense:  90 tablet    Refill:  0   sacubitril-valsartan (ENTRESTO) 97-103 MG    Sig: Take 1 tablet by mouth 2 (two) times daily.    Dispense:  60 tablet    Refill:  0    Medications Discontinued During This Encounter  Medication Reason   naproxen (NAPROSYN) 500 MG tablet Error   sacubitril-valsartan (ENTRESTO) 97-103 MG Reorder   dapagliflozin propanediol (FARXIGA) 10 MG TABS tablet Reorder    Current Outpatient Medications:    aspirin 81 MG tablet, Take 81 mg by  mouth daily., Disp: , Rfl:    atorvastatin (LIPITOR) 20 MG tablet, Take 40 mg by mouth daily. , Disp: , Rfl:    GuaiFENesin (MUCINEX PO), Take 2 tablets by mouth daily as needed., Disp: , Rfl:    LUTEIN PO, Take by mouth daily., Disp: , Rfl:    magnesium oxide (MAG-OX) 400 MG tablet, Take 1 tablet (400 mg total) by mouth daily., Disp: 30 tablet, Rfl: 11   metoprolol succinate (TOPROL-XL) 25 MG 24 hr tablet, TAKE 1 TABLET BY MOUTH ONCE DAILY. HOLD IF TOP BLOOD PRESSURE NUMBER IS LESS  THAN 100 MMHG OR PILSE LES THAN 60 BPM, Disp: 30 tablet, Rfl: 3   Multiple Vitamins-Minerals (MULTIVITAMIN PO), Take by mouth daily., Disp: , Rfl:    Omega-3 Fatty Acids (OMEGA 3 PO), Take 1,000 mg by mouth. 4 tablets daily, Disp: , Rfl:    OVER THE COUNTER MEDICATION, Take by mouth. Super Beta Prostate, Disp: , Rfl:    potassium chloride SA (KLOR-CON) 20 MEQ tablet, Take 20 mEq by mouth daily., Disp: , Rfl:    spironolactone (ALDACTONE) 25 MG tablet, Take 1 tablet (25 mg total) by mouth daily at 10 pm., Disp: 30 tablet, Rfl: 0   dapagliflozin propanediol (FARXIGA) 10 MG TABS tablet, Take 1 tablet (10 mg total) by mouth daily before breakfast., Disp: 90 tablet, Rfl: 0   sacubitril-valsartan (ENTRESTO) 97-103 MG, Take 1 tablet by mouth 2 (two) times daily., Disp: 60 tablet, Rfl: 0  No orders of the defined types were placed in this encounter.  There are no Patient Instructions on file for this visit.   --Continue cardiac medications as reconciled in final medication list. --Return in about 3 weeks (around 02/12/2021) for Follow up, Post heart catheterization. Or sooner if needed. --Continue follow-up with your primary care physician regarding the management of your other chronic comorbid conditions.  Patient's questions and concerns were addressed to his satisfaction. He voices understanding of the instructions provided during this encounter.   This note was created using a voice recognition software as a result  there may be grammatical errors inadvertently enclosed that do not reflect the nature of this encounter. Every attempt is made to correct such errors.  Rex Kras, [place], [facility]  Pager: [phone] Office: [phone]

## 2021-02-07 ENCOUNTER — Other Ambulatory Visit: Payer: Self-pay

## 2021-02-07 DIAGNOSIS — I5042 Chronic combined systolic (congestive) and diastolic (congestive) heart failure: Secondary | ICD-10-CM

## 2021-02-11 NOTE — Progress Notes (Signed)
ID:  Keith Wagner, DOB [birth year], MRN [id number]  PCP:  Keith Halim., PA-C  Cardiologist:  Rex Kras, DO, [facility] (established care 06/10/2019) Former Cardiology Providers: Jeri Lager, APRN, FNP-C  Date: 02/12/21 Last Office Visit: 01/22/2021  Chief Complaint  Patient presents with   Follow-up   POST HEART CATH    HPI  Keith Wagner is a 80 y.o. male who presents to the office with a chief complaint of "follow-up after post heart catheterization." Patient's past medical history and cardiovascular risk factors include: Heart failure with reduced EF, stage C, NYHA class III, moderate valvular heart disease, hypertension, diet-controlled diabetes melitis type II, hyperlipidemia, advanced age  He is referred to the office at the request of Keith Wagner, Keith W., PA-C for evaluation of aortic stenosis and congestive heart failure.  Patient is accompanied by his wife Keith Wagner) at today's visit.  Patient is being followed for aortic stenosis management long-term presents to the office sooner than his scheduled appointment visit back in September 2022 due to new onset of shortness of breath, lower extremity swelling, and decreased functional status.  Patient was initially working with his PCP with regards to pneumonia management and diuresis; however, due to continued symptoms he was referred to cardiology for sooner appointment visit.  Since then an echocardiogram was performed which noted moderately reduced LVEF, and dilated LV cavity, grade 2 diastolic impairment, and elevated left atrial pressure.  Patient is also noted to have moderate aortic stenosis per dimensional index however suspected the degree of aortic stenosis may be underappreciated due to low-flow low gradient physiology.  Patient's medications were uptitrated and he was scheduled for left and right heart catheterization for aortic valve interventions.  Since last office visit patient underwent heart catheterization  and was noted to have multivessel CAD.  He was discharged home with close follow-up with cardiothoracic surgery.  Today patient follows up post procedure and is doing well.  No postprocedure complications.  He denies any active chest pain or heart failure symptoms.  ALLERGIES: No Known Allergies  MEDICATION LIST PRIOR TO VISIT: Current Meds  Medication Sig   acetaminophen (TYLENOL) 500 MG tablet Take 1,000 mg by mouth every 6 (six) hours as needed for moderate pain.   aspirin 81 MG tablet Take 81 mg by mouth daily.   atorvastatin (LIPITOR) 20 MG tablet Take 20 mg by mouth daily.   calcium carbonate (TUMS - DOSED IN MG ELEMENTAL CALCIUM) 500 MG chewable tablet Chew 500-1,000 mg by mouth daily as needed for indigestion or heartburn.   clobetasol cream (TEMOVATE) 5.70 % Apply 1 application topically 2 (two) times daily as needed (eczema).   dapagliflozin propanediol (FARXIGA) 10 MG TABS tablet Take 1 tablet (10 mg total) by mouth daily before breakfast.   Guaifenesin (MUCINEX MAXIMUM STRENGTH) 1200 MG TB12 Take 1,200 mg by mouth daily.   Lutein 6 MG TABS Take 6 mg by mouth daily.   magnesium oxide (MAG-OX) 400 MG tablet Take 1 tablet (400 mg total) by mouth daily.   metoprolol succinate (TOPROL-XL) 25 MG 24 hr tablet TAKE 1 TABLET BY MOUTH ONCE DAILY. HOLD IF TOP BLOOD PRESSURE NUMBER IS LESS THAN 100 MMHG OR PILSE LES THAN 60 BPM   Multiple Vitamins-Minerals (MULTIVITAMIN PO) Take 1 tablet by mouth daily.   Omega-3 Fatty Acids (FISH OIL) 1000 MG CAPS Take 1,000 mg by mouth daily.   OVER THE COUNTER MEDICATION Take 1 tablet by mouth daily. Super Beta Prostate Advanced   spironolactone (ALDACTONE) 25 MG tablet  TAKE 1 TABLET BY MOUTH ONCE DAILY AT  10  PM   triamcinolone cream (KENALOG) 0.1 % Apply 1 application topically 2 (two) times daily as needed (eczema).   [DISCONTINUED] sacubitril-valsartan (ENTRESTO) 97-103 MG Take 1 tablet by mouth 2 (two) times daily.     PAST MEDICAL HISTORY: Past  Medical History:  Diagnosis Date   Arthritis    Asthma    uses inhaler PRN   Bilateral carotid bruits 06/12/2018   Cataract    Diabetes mellitus without complication ([place])    per pt "pre-diabetic"- off Metformin- diet controlled    GERD (gastroesophageal reflux disease)    Gout    Heart murmur    Hypercholesteremia 06/12/2018   Hyperlipidemia    Hypertension    Mild aortic stenosis 06/12/2018    PAST SURGICAL HISTORY: Past Surgical History:  Procedure Laterality Date   CATARACT EXTRACTION Left    COLONOSCOPY     POLYPECTOMY     RIGHT HEART CATH AND CORONARY ANGIOGRAPHY N/A 02/06/2021   Procedure: RIGHT HEART CATH AND CORONARY ANGIOGRAPHY;  Surgeon: Nigel Mormon, MD;  Location: [place] [facility];  Service: Cardiovascular;  Laterality: N/A;   THORACIC AORTOGRAM N/A 02/06/2021   Procedure: THORACIC AORTOGRAM;  Surgeon: Nigel Mormon, MD;  Location: [place] [facility];  Service: Cardiovascular;  Laterality: N/A;    FAMILY HISTORY: No family history of premature coronary disease or sudden cardiac death.  SOCIAL HISTORY:  The patient  reports that he has quit smoking. His smoking use included cigars. He has never used smokeless tobacco. He reports that he does not drink alcohol and does not use drugs.  REVIEW OF SYSTEMS: Review of Systems  Constitutional: Negative for chills and fever.  HENT:  Negative for hoarse voice and nosebleeds.   Eyes:  Negative for discharge, double vision and pain.  Cardiovascular:  Positive for dyspnea on exertion (improved). Negative for chest pain, claudication, leg swelling, near-syncope, orthopnea, palpitations, paroxysmal nocturnal dyspnea and syncope.  Respiratory:  Positive for shortness of breath. Negative for cough and hemoptysis.   Musculoskeletal:  Negative for muscle cramps and myalgias.  Gastrointestinal:  Negative for abdominal pain, constipation, diarrhea, hematemesis, hematochezia, melena, nausea and vomiting.   Neurological:  Negative for dizziness and light-headedness.   PHYSICAL EXAM: Vitals with BMI 02/12/2021 02/12/2021 02/06/2021  Height - $Remove'5\' 6"'[id number]$  -  Weight - 170 lbs 10 oz -  BMI - 08.65 -  Systolic 784 696 -  Diastolic 98 295 -  Pulse 79 80 85   CONSTITUTIONAL: Well-developed and well-nourished. No acute distress.  SKIN: Skin is warm and dry. No rash noted. No cyanosis. No pallor. No jaundice HEAD: Normocephalic and atraumatic.  EYES: No scleral icterus MOUTH/THROAT: Moist oral membranes.  NECK: No JVD present. No thyromegaly noted.  Bilateral carotid bruits  LYMPHATIC: No visible cervical adenopathy.  CHEST Normal respiratory effort. No intercostal retractions  LUNGS: Clear to auscultation in the upper lung fields with decreased breath sounds at the right lower lung base.  Minimal rales.  No stridor. No wheezes.  CARDIOVASCULAR: Regular, positive S1-S2, 3 out of 6 systolic ejection murmur heard at the second right costal space, soft holosystolic murmur heard at the apex, ABDOMINAL: Soft, nontender, nondistended, positive bowel sounds in all 4 quadrants no apparent ascites.  EXTREMITIES: Trace bilateral peripheral edema, 2+ DP and PT pulses. HEMATOLOGIC: No significant bruising NEUROLOGIC: Oriented to person, place, and time. Nonfocal. Normal muscle tone.  PSYCHIATRIC: Normal mood and affect. Normal behavior. Cooperative  CXR 11/22/2020: No significant change of small layering bilateral pleural effusions and mild patchy overlying airspace opacities.  CARDIAC DATABASE: EKG: 12/01/2020: Normal sinus rhythm, 94 bpm, left axis deviation, left anterior fascicular block, LVH, nonspecific T wave abnormality, PVCs, without underlying injury pattern.   Echocardiogram: 12/07/2020: Moderately depressed LV systolic function with visual EF 30-35%. Left ventricle cavity is dilated. Moderate concentric hypertrophy of the left ventricle. Hypokinetic global wall motion. Doppler evidence of grade II  (pseudonormal) diastolic dysfunction, elevated LAP.  Left atrial cavity is mildly dilated. Mild aortic stenosis (peak velocity 2.27m/s, Peak gradient 60mmHg, Mean gradient 45mmHg, AVA 0.9cm2, DI 0.30) severity of AS may be under appreciated due to low flow low gradient AS.  Moderate (Grade III) aortic regurgitation. Moderate (Grade II) mitral regurgitation. Mild tricuspid regurgitation. Mild pulmonary hypertension. RVSP measures 44 mmHg. IVC is normal with a respiratory response of <50%. Compared to study 05/25/2019: LVEF 50-55% is now 30-35%, G1DD is now G2DD with elevated LAP, PHTN is new., otherwise no significant change.    Stress Testing: Treadmill Exercise stress 11/11/2014: Indication: Diabetes, HTN The patient exercised according to Bruce Protocol, Total time recorded  6:00 min achieving max heart rate of  148 which was  100% of MPHR for age and  7.05 METS of work. Occasional PVC during exercise.  Normal BP response. Resting ECG showing NSR. There was no ST-T changes of ischemia with exercise stress test. No exercise induced arrythmias noted. Stress terminated due to [facility] met.  Heart Catheterization: LHC / RHC / Thoracic Aortogram 02/06/2021: LM: Distal calcific 75% stenosis LAD: Mid 80% stenosis LCX: large vessel. Ostial 70% stenosis.  RCA: Dominant, but small vessel. No significant disease seen on well opacified non-selective angiogram.   Normal PCW Unable to cross aortic valve due to severe aorta tortuosity.   CVTS consult for CABG +/- AVR  Carotid artery duplex 12/04/2017: Minimal stenosis in the right internal carotid artery (1-15%). Minimal stenosis in the left internal carotid artery (1-15%). Antegrade right vertebral artery flow. Antegrade left vertebral artery flow. Follow up when clinically indicated.  LABORATORY DATA: CBC Latest Ref Rng & Units 02/06/2021 02/06/2021 01/30/2021  WBC 3.4 - 10.8 x10E3/uL - - 10.3  Hemoglobin 13.0 - 17.0 g/dL 11.6(L) 12.6(L) 12.7(L)   Hematocrit 39.0 - 52.0 % 34.0(L) 37.0(L) 41.2  Platelets 150 - 450 x10E3/uL - - 355    CMP Latest Ref Rng & Units 02/06/2021 02/06/2021 01/30/2021  Glucose 70 - 99 mg/dL - - 91  BUN 8 - 27 mg/dL - - 20  Creatinine 0.76 - 1.27 mg/dL - - 1.32(H)  Sodium 135 - 145 mmol/L 142 141 139  Potassium 3.5 - 5.1 mmol/L 3.7 4.2 5.0  Chloride 96 - 106 mmol/L - - 103  CO2 20 - 29 mmol/L - - 23  Calcium 8.6 - 10.2 mg/dL - - 9.6  Total Protein 6.0 - 8.5 g/dL - - -  Total Bilirubin 0.0 - 1.2 mg/dL - - -  Alkaline Phos 44 - 121 IU/L - - -  AST 0 - 40 IU/L - - -  ALT 0 - 44 IU/L - - -    Lipid Panel  No results found for: CHOL, TRIG, HDL, CHOLHDL, VLDL, LDLCALC, LDLDIRECT, LABVLDL  No components found for: NTPROBNP Recent Labs    12/08/20 0909 12/22/20 0833 01/01/21 0930 01/30/21 0920  PROBNP 1,668* 1,673* 1,231* 412   No results for input(s): TSH in the last 8760 hours.  BMP Recent Labs    12/22/20 0833 01/01/21  0930 01/30/21 0921 02/06/21 0802  NA 142 142 139 142  141  K 4.5 5.3* 5.0 3.7  4.2  CL 103 105 103  --   CO2 $Re'22 24 23  'SLX$ --   GLUCOSE 116* 103* 91  --   BUN $Re'18 16 20  'knM$ --   CREATININE 1.36* 1.31* 1.32*  --   CALCIUM 9.4 9.8 9.6  --     HEMOGLOBIN A1C No results found for: HGBA1C, MPG  11/29/2020 Sodium 142, potassium 4, chloride 105, bicarb 29, BUN 16, creatinine 1.23. Magnesium 2.4. BNP 1351  11/22/2020: Hemoglobin 11.8 g/dL, hematocrit 36.6%  IMPRESSION:    ICD-10-CM   1. Coronary artery disease involving native coronary artery of native heart without angina pectoris  I25.10     2. Chronic combined systolic and diastolic congestive heart failure, NYHA class 2 (HCC)  I50.42 sacubitril-valsartan (ENTRESTO) 97-103 MG    3. Ischemic cardiomyopathy  I25.5     4. Benign essential hypertension  I10     5. Mixed hyperlipidemia  E78.2     6. Diet-controlled diabetes mellitus ([place])  E11.9     7. Former smoker  Z87.891     17. Dyspnea on exertion  R06.09          RECOMMENDATIONS: Keith Wagner is a 80 y.o. male whose past medical history and cardiac risk factors include: Heart failure with reduced EF, stage C, NYHA class III, mild to moderate valvular heart disease, hypertension, diet-controlled diabetes melitis type II, hyperlipidemia, advanced age.  Coronary artery disease without angina pectoris: Patient underwent left and right heart catheterization as a part of work-up for valve replacement given the newly discovered cardiomyopathy.  However, patient was noted to have multivessel CAD and now referred to cardiothoracic surgery for CABG +/- AVR.  Spoke to cardiothoracic surgery PA to expedite the initial consultation, tentatively scheduled for February 22, 2021. Denies any chest pain or anginal equivalents Overall euvolemic and not in congestive heart failure. Both patient and his wife are informed that if he has chest pain he needs to go to the closest ER via EMS.  They verbalized understanding.  Ischemic cardiomyopathy: See above. On appropriate guideline directed medical therapy.  Chronic combined systolic and diastolic CHF, NYHA class 2 (HCC) Euvolemic. Echocardiogram notes moderately reduced LVEF, dilated LV cavity, global hypokinesis, grade 2 diastolic dysfunction, and elevated LAP. Refilled Entresto. Most recent NT proBNP within normal limits. Home blood pressures are very well controlled based on the blood pressure log that the patient brings in.  SBP ranging between 121-134 mmHg.  Pulse between 61-78 bpm.  And weight relatively stable at 164-166 pounds Monitor for now.  Moderate aortic stenosis by prior echocardiogram Presented with heart failure exacerbation/newly diagnosed in September 2022 Now euvolemic. Denies angina pectoris, no heart failure symptoms, or arrhythmias. I suspect the severity of aortic stenosis may be underappreciated given his low flow state.  Based on a dimensional index of severity of aortic stenosis is  moderate. Will have CT surgery consider AVR during their presurgical evaluation and work-up.  Benign essential hypertension Office blood pressures are not well controlled. Home blood pressures are very well controlled. Reviewed his blood pressure, pulse, and weight log summarized findings noted above Medications reconciled  Mixed hyperlipidemia Currently on atorvastatin.   He denies myalgia or other side effects.  FINAL MEDICATION LIST END OF ENCOUNTER: Meds ordered this encounter  Medications   sacubitril-valsartan (ENTRESTO) 97-103 MG    Sig: Take 1 tablet by mouth 2 (two) times  daily.    Dispense:  60 tablet    Refill:  0     Medications Discontinued During This Encounter  Medication Reason   sacubitril-valsartan (ENTRESTO) 97-103 MG Reorder     Current Outpatient Medications:    acetaminophen (TYLENOL) 500 MG tablet, Take 1,000 mg by mouth every 6 (six) hours as needed for moderate pain., Disp: , Rfl:    aspirin 81 MG tablet, Take 81 mg by mouth daily., Disp: , Rfl:    atorvastatin (LIPITOR) 20 MG tablet, Take 20 mg by mouth daily., Disp: , Rfl:    calcium carbonate (TUMS - DOSED IN MG ELEMENTAL CALCIUM) 500 MG chewable tablet, Chew 500-1,000 mg by mouth daily as needed for indigestion or heartburn., Disp: , Rfl:    clobetasol cream (TEMOVATE) 3.54 %, Apply 1 application topically 2 (two) times daily as needed (eczema)., Disp: , Rfl:    dapagliflozin propanediol (FARXIGA) 10 MG TABS tablet, Take 1 tablet (10 mg total) by mouth daily before breakfast., Disp: 90 tablet, Rfl: 0   Guaifenesin (MUCINEX MAXIMUM STRENGTH) 1200 MG TB12, Take 1,200 mg by mouth daily., Disp: , Rfl:    Lutein 6 MG TABS, Take 6 mg by mouth daily., Disp: , Rfl:    magnesium oxide (MAG-OX) 400 MG tablet, Take 1 tablet (400 mg total) by mouth daily., Disp: 30 tablet, Rfl: 11   metoprolol succinate (TOPROL-XL) 25 MG 24 hr tablet, TAKE 1 TABLET BY MOUTH ONCE DAILY. HOLD IF TOP BLOOD PRESSURE NUMBER IS LESS  THAN 100 MMHG OR PILSE LES THAN 60 BPM, Disp: 30 tablet, Rfl: 3   Multiple Vitamins-Minerals (MULTIVITAMIN PO), Take 1 tablet by mouth daily., Disp: , Rfl:    Omega-3 Fatty Acids (FISH OIL) 1000 MG CAPS, Take 1,000 mg by mouth daily., Disp: , Rfl:    OVER THE COUNTER MEDICATION, Take 1 tablet by mouth daily. Super Beta Prostate Advanced, Disp: , Rfl:    spironolactone (ALDACTONE) 25 MG tablet, TAKE 1 TABLET BY MOUTH ONCE DAILY AT  10  PM, Disp: 30 tablet, Rfl: 0   triamcinolone cream (KENALOG) 0.1 %, Apply 1 application topically 2 (two) times daily as needed (eczema)., Disp: , Rfl:    sacubitril-valsartan (ENTRESTO) 97-103 MG, Take 1 tablet by mouth 2 (two) times daily., Disp: 60 tablet, Rfl: 0  No orders of the defined types were placed in this encounter.  There are no Patient Instructions on file for this visit.   --Continue cardiac medications as reconciled in final medication list. --Return in about 3 months (around 05/15/2021) for Follow up, CAD. Or sooner if needed. --Continue follow-up with your primary care physician regarding the management of your other chronic comorbid conditions.  Patient's questions and concerns were addressed to his satisfaction. He voices understanding of the instructions provided during this encounter.   This note was created using a voice recognition software as a result there may be grammatical errors inadvertently enclosed that do not reflect the nature of this encounter. Every attempt is made to correct such errors.  Rex Kras, [place], [facility]  Pager: [phone] Office: [phone]

## 2021-02-12 ENCOUNTER — Ambulatory Visit: Payer: Medicare HMO | Admitting: Cardiology

## 2021-02-12 ENCOUNTER — Encounter: Payer: Self-pay | Admitting: Cardiology

## 2021-02-12 ENCOUNTER — Other Ambulatory Visit: Payer: Self-pay

## 2021-02-12 VITALS — BP 149/98 | HR 79 | Temp 97.4°F | Resp 17 | Ht 66.0 in | Wt 170.6 lb

## 2021-02-12 DIAGNOSIS — I5042 Chronic combined systolic (congestive) and diastolic (congestive) heart failure: Secondary | ICD-10-CM

## 2021-02-12 DIAGNOSIS — E119 Type 2 diabetes mellitus without complications: Secondary | ICD-10-CM

## 2021-02-12 DIAGNOSIS — I255 Ischemic cardiomyopathy: Secondary | ICD-10-CM

## 2021-02-12 DIAGNOSIS — E782 Mixed hyperlipidemia: Secondary | ICD-10-CM

## 2021-02-12 DIAGNOSIS — I2510 Atherosclerotic heart disease of native coronary artery without angina pectoris: Secondary | ICD-10-CM

## 2021-02-12 DIAGNOSIS — I10 Essential (primary) hypertension: Secondary | ICD-10-CM

## 2021-02-12 DIAGNOSIS — Z87891 Personal history of nicotine dependence: Secondary | ICD-10-CM

## 2021-02-12 MED ORDER — ENTRESTO 97-103 MG PO TABS: 1.0000 | ORAL_TABLET | Freq: Two times a day (BID) | ORAL | 0 refills | Status: DC

## 2021-02-14 ENCOUNTER — Other Ambulatory Visit: Payer: Self-pay | Admitting: Cardiology

## 2021-02-14 DIAGNOSIS — I5042 Chronic combined systolic (congestive) and diastolic (congestive) heart failure: Secondary | ICD-10-CM

## 2021-02-20 ENCOUNTER — Encounter: Payer: Self-pay | Admitting: *Deleted

## 2021-02-20 ENCOUNTER — Encounter: Payer: Self-pay | Admitting: Thoracic Surgery (Cardiothoracic Vascular Surgery)

## 2021-02-20 ENCOUNTER — Other Ambulatory Visit: Payer: Self-pay | Admitting: *Deleted

## 2021-02-20 ENCOUNTER — Other Ambulatory Visit: Payer: Self-pay

## 2021-02-20 ENCOUNTER — Institutional Professional Consult (permissible substitution): Payer: Medicare HMO | Admitting: Thoracic Surgery (Cardiothoracic Vascular Surgery)

## 2021-02-20 VITALS — BP 157/90 | HR 74 | Resp 20 | Ht 66.0 in | Wt 169.0 lb

## 2021-02-20 DIAGNOSIS — I2510 Atherosclerotic heart disease of native coronary artery without angina pectoris: Secondary | ICD-10-CM

## 2021-02-20 DIAGNOSIS — I350 Nonrheumatic aortic (valve) stenosis: Secondary | ICD-10-CM

## 2021-02-20 NOTE — H&P (View-Only) (Signed)
PCP is Aletha Halim., PA-C Referring Provider is Nigel Mormon, MD  Chief Complaint  Patient presents with   Coronary Artery Disease    Surgical consult, Cardiac Cath 02/06/21, ECHO 12/07/20    HPI: Keith Wagner is sent for consultation regarding left main disease and aortic stenosis.  Keith Wagner is a 80 year old man with a history of hypertension, hyperlipidemia, type 2 diabetes, asthma, gout, and aortic stenosis.  He also has a history of NYHA class III congestive heart failure, currently well compensated.  Back in August and September he was having shortness of breath and congestion.  Chest x-ray showed signs of pulmonary edema and his BNP was elevated.  Improved with diuresis.  His wife says he is no longer complaining of shortness of breath but that he has cut his activities back and he is exhausted all the time.  He had an echocardiogram which showed an ejection fraction of 30 to 35%.  Aortic valve gradients were only mild but his calculated valve area was 0.9 cm.  Reviewing the images the valve is heavily calcified.  He underwent cardiac catheterization which showed left main and two-vessel coronary disease.   Past Medical History:  Diagnosis Date   Arthritis    Asthma    uses inhaler PRN   Bilateral carotid bruits 06/12/2018   Cataract    Diabetes mellitus without complication ([place])    per pt "pre-diabetic"- off Metformin- diet controlled    GERD (gastroesophageal reflux disease)    Gout    Heart murmur    Hypercholesteremia 06/12/2018   Hyperlipidemia    Hypertension    Mild aortic stenosis 06/12/2018    Past Surgical History:  Procedure Laterality Date   CATARACT EXTRACTION Left    COLONOSCOPY     POLYPECTOMY     RIGHT HEART CATH AND CORONARY ANGIOGRAPHY N/A 02/06/2021   Procedure: RIGHT HEART CATH AND CORONARY ANGIOGRAPHY;  Surgeon: Nigel Mormon, MD;  Location: [place] [facility];  Service: Cardiovascular;  Laterality: N/A;   THORACIC AORTOGRAM  N/A 02/06/2021   Procedure: THORACIC AORTOGRAM;  Surgeon: Nigel Mormon, MD;  Location: [facility] [facility];  Service: Cardiovascular;  Laterality: N/A;    Family History  Problem Relation Age of Onset   Colon cancer Neg Hx    Esophageal cancer Neg Hx    Stomach cancer Neg Hx    Rectal cancer Neg Hx    Colon polyps Neg Hx     Social History Social History   Tobacco Use   Smoking status: Former    Types: Cigars   Smokeless tobacco: Never  Vaping Use   Vaping Use: Never used  Substance Use Topics   Alcohol use: No   Drug use: No    Current Outpatient Medications  Medication Sig Dispense Refill   acetaminophen (TYLENOL) 500 MG tablet Take 1,000 mg by mouth every 6 (six) hours as needed for moderate pain.     aspirin 81 MG tablet Take 81 mg by mouth daily.     atorvastatin (LIPITOR) 20 MG tablet Take 20 mg by mouth daily.     calcium carbonate (TUMS - DOSED IN MG ELEMENTAL CALCIUM) 500 MG chewable tablet Chew 500-1,000 mg by mouth daily as needed for indigestion or heartburn.     clobetasol cream (TEMOVATE) 7.20 % Apply 1 application topically 2 (two) times daily as needed (eczema).     dapagliflozin propanediol (FARXIGA) 10 MG TABS tablet Take 1 tablet (10 mg total) by mouth daily  before breakfast. 90 tablet 0   ENTRESTO 97-103 MG Take 1 tablet by mouth twice daily 60 tablet 0   Guaifenesin (MUCINEX MAXIMUM STRENGTH) 1200 MG TB12 Take 1,200 mg by mouth daily.     Lutein 6 MG TABS Take 6 mg by mouth daily.     magnesium oxide (MAG-OX) 400 MG tablet Take 1 tablet (400 mg total) by mouth daily. 30 tablet 11   metoprolol succinate (TOPROL-XL) 25 MG 24 hr tablet TAKE 1 TABLET BY MOUTH ONCE DAILY. HOLD IF TOP BLOOD PRESSURE NUMBER IS LESS THAN 100 MMHG OR PILSE LES THAN 60 BPM 30 tablet 3   Multiple Vitamins-Minerals (MULTIVITAMIN PO) Take 1 tablet by mouth daily.     Omega-3 Fatty Acids (FISH OIL) 1000 MG CAPS Take 1,000 mg by mouth daily.     OVER THE COUNTER MEDICATION Take  1 tablet by mouth daily. Super Beta Prostate Advanced     spironolactone (ALDACTONE) 25 MG tablet TAKE 1 TABLET BY MOUTH ONCE DAILY AT  10  PM 30 tablet 0   triamcinolone cream (KENALOG) 0.1 % Apply 1 application topically 2 (two) times daily as needed (eczema).     No current facility-administered medications for this visit.    No Known Allergies  Review of Systems  Constitutional:  Positive for activity change and fatigue. Negative for unexpected weight change.  HENT:  Negative for trouble swallowing and voice change.   Eyes:  Negative for visual disturbance.  Respiratory:  Negative for cough, shortness of breath and wheezing.   Cardiovascular:  Negative for chest pain and leg swelling.  Gastrointestinal:  Positive for abdominal pain (Reflux).  Genitourinary:  Negative for difficulty urinating and dysuria.  Musculoskeletal:  Negative for arthralgias and gait problem.  Neurological:  Negative for dizziness, syncope and weakness.  Hematological:  Negative for adenopathy. Does not bruise/bleed easily.   BP (!) 157/90   Pulse 74   Resp 20   Ht 5\' 6"  (1.676 m)   Wt 169 lb (76.7 kg)   SpO2 96%   BMI 27.28 kg/m  Physical Exam Vitals reviewed.  Constitutional:      General: He is not in acute distress.    Appearance: Normal appearance.  HENT:     Head: Normocephalic and atraumatic.  Eyes:     General: No scleral icterus.    Extraocular Movements: Extraocular movements intact.  Neck:     Vascular: Carotid bruit (Bruit versus transmitted murmur) present.  Cardiovascular:     Rate and Rhythm: Normal rate and regular rhythm.     Heart sounds: Murmur (3/6 crescendo decrescendo right upper sternal border) heard.  Pulmonary:     Effort: Pulmonary effort is normal. No respiratory distress.     Breath sounds: Normal breath sounds. No wheezing.  Abdominal:     General: There is no distension.     Palpations: Abdomen is soft.  Skin:    General: Skin is warm and dry.  Neurological:      General: No focal deficit present.     Mental Status: He is alert and oriented to person, place, and time.     Cranial Nerves: No cranial nerve deficit.     Motor: No weakness.  No peripheral edema   Diagnostic Tests: Cardiac catheterization 02/06/2021 LM: Distal calcific 75% stenosis LAD: Mid 80% stenosis LCX: large vessel. Ostial 70% stenosis.  RCA: Dominant, but small vessel. No significant disease seen on well opacified non-selective angiogram.   Normal PCW Unable to cross aortic  valve due to severe aorta tortuosity.   CVTS consult for CABG +/- AVR  Echocardiogram 12/07/2020:  Moderately depressed LV systolic function with visual EF 30-35%. Left  ventricle cavity is dilated. Moderate concentric hypertrophy of the left  ventricle. Hypokinetic global wall motion. Doppler evidence of grade II  (pseudonormal) diastolic dysfunction, elevated LAP.  Left atrial cavity is mildly dilated.  Mild aortic stenosis (peak velocity 2.20m/s, Peak gradient 52mmHg, Mean  gradient 52mmHg, AVA 0.9cm2, DI 0.30) severity of AS may be under  appreciated due to low flow low gradient AS.  Moderate (Grade III) aortic regurgitation.  Moderate (Grade II) mitral regurgitation.  Mild tricuspid regurgitation. Mild pulmonary hypertension. RVSP measures  44 mmHg.  IVC is normal with a respiratory response of <50%.  Compared to study 05/25/2019: LVEF 50-55% is now 30-35%, G1DD is now G2DD  with elevated LAP, PHTN is new., otherwise no significant change  I personally reviewed the catheterization images which show left main disease. I personally reviewed the echocardiogram images which show heavily calcified aortic valve.  Although gradients mild, in setting of low EF I suspect he has severe AAS.  His calculated valve area was 0.9 cm.  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  He recently was having issues with congestive heart failure.  That led to repeat echocardiogram and then cardiac catheterization.  At catheterization he has left main coronary disease.  Although his valve gradients are not particularly high, his valve is very calcified and his calculated valve area is tight at 0.9 cm.  I think he has a least moderate to severe aortic stenosis.  Coronary bypass grafting with aortic valve replacement is indicated for survival benefit.  I informed Mr. and Mrs. Yoak of the general nature of the procedure.  They understand the need for general anesthesia, the incisions to be used, the use of cardiopulmonary bypass, the use of drainage tubes and temporary pacemaker wires postoperatively, the expected hospital stay, and the overall recovery.  I informed them of the indications, risks, benefits, and alternatives.  They understand the risks include, but not limited to death, MI, DVT, PE, stroke, bleeding, possible need for transfusion, infection, cardiac arrhythmias, heart block from pacemaker, respiratory or renal failure, as well as possibility of other unforeseeable complications.  He accepts the risks and agrees to proceed.  We did discuss the advantages and disadvantages of mechanical versus tissue valves.  At his age a tissue valve is the preferred alternative.  He is in agreement with that.  He has not seen a dentist in several years.  He needs to see a dentist prior to surgery.  Plan: Dental eval Tentatively plan for coronary bypass grafting x2 and aortic valve replacement on Thursday, 03/01/2021  [name], MD [facility] [phone]

## 2021-02-20 NOTE — Progress Notes (Signed)
PCP is Aletha Halim., PA-C Referring Provider is Nigel Mormon, MD  Chief Complaint  Patient presents with   Coronary Artery Disease    Surgical consult, Cardiac Cath 02/06/21, ECHO 12/07/20    HPI: Mr. Goettel is sent for consultation regarding left main disease and aortic stenosis.  Kenneth Cuaresma is a 80 year old man with a history of hypertension, hyperlipidemia, type 2 diabetes, asthma, gout, and aortic stenosis.  He also has a history of NYHA class III congestive heart failure, currently well compensated.  Back in August and September he was having shortness of breath and congestion.  Chest x-ray showed signs of pulmonary edema and his BNP was elevated.  Improved with diuresis.  His wife says he is no longer complaining of shortness of breath but that he has cut his activities back and he is exhausted all the time.  He had an echocardiogram which showed an ejection fraction of 30 to 35%.  Aortic valve gradients were only mild but his calculated valve area was 0.9 cm.  Reviewing the images the valve is heavily calcified.  He underwent cardiac catheterization which showed left main and two-vessel coronary disease.   Past Medical History:  Diagnosis Date   Arthritis    Asthma    uses inhaler PRN   Bilateral carotid bruits 06/12/2018   Cataract    Diabetes mellitus without complication ([place])    per pt "pre-diabetic"- off Metformin- diet controlled    GERD (gastroesophageal reflux disease)    Gout    Heart murmur    Hypercholesteremia 06/12/2018   Hyperlipidemia    Hypertension    Mild aortic stenosis 06/12/2018    Past Surgical History:  Procedure Laterality Date   CATARACT EXTRACTION Left    COLONOSCOPY     POLYPECTOMY     RIGHT HEART CATH AND CORONARY ANGIOGRAPHY N/A 02/06/2021   Procedure: RIGHT HEART CATH AND CORONARY ANGIOGRAPHY;  Surgeon: Nigel Mormon, MD;  Location: [place] [facility];  Service: Cardiovascular;  Laterality: N/A;   THORACIC AORTOGRAM  N/A 02/06/2021   Procedure: THORACIC AORTOGRAM;  Surgeon: Nigel Mormon, MD;  Location: [place] [facility];  Service: Cardiovascular;  Laterality: N/A;    Family History  Problem Relation Age of Onset   Colon cancer Neg Hx    Esophageal cancer Neg Hx    Stomach cancer Neg Hx    Rectal cancer Neg Hx    Colon polyps Neg Hx     Social History Social History   Tobacco Use   Smoking status: Former    Types: Cigars   Smokeless tobacco: Never  Vaping Use   Vaping Use: Never used  Substance Use Topics   Alcohol use: No   Drug use: No    Current Outpatient Medications  Medication Sig Dispense Refill   acetaminophen (TYLENOL) 500 MG tablet Take 1,000 mg by mouth every 6 (six) hours as needed for moderate pain.     aspirin 81 MG tablet Take 81 mg by mouth daily.     atorvastatin (LIPITOR) 20 MG tablet Take 20 mg by mouth daily.     calcium carbonate (TUMS - DOSED IN MG ELEMENTAL CALCIUM) 500 MG chewable tablet Chew 500-1,000 mg by mouth daily as needed for indigestion or heartburn.     clobetasol cream (TEMOVATE) 2.95 % Apply 1 application topically 2 (two) times daily as needed (eczema).     dapagliflozin propanediol (FARXIGA) 10 MG TABS tablet Take 1 tablet (10 mg total) by mouth daily  before breakfast. 90 tablet 0   ENTRESTO 97-103 MG Take 1 tablet by mouth twice daily 60 tablet 0   Guaifenesin (MUCINEX MAXIMUM STRENGTH) 1200 MG TB12 Take 1,200 mg by mouth daily.     Lutein 6 MG TABS Take 6 mg by mouth daily.     magnesium oxide (MAG-OX) 400 MG tablet Take 1 tablet (400 mg total) by mouth daily. 30 tablet 11   metoprolol succinate (TOPROL-XL) 25 MG 24 hr tablet TAKE 1 TABLET BY MOUTH ONCE DAILY. HOLD IF TOP BLOOD PRESSURE NUMBER IS LESS THAN 100 MMHG OR PILSE LES THAN 60 BPM 30 tablet 3   Multiple Vitamins-Minerals (MULTIVITAMIN PO) Take 1 tablet by mouth daily.     Omega-3 Fatty Acids (FISH OIL) 1000 MG CAPS Take 1,000 mg by mouth daily.     OVER THE COUNTER MEDICATION Take  1 tablet by mouth daily. Super Beta Prostate Advanced     spironolactone (ALDACTONE) 25 MG tablet TAKE 1 TABLET BY MOUTH ONCE DAILY AT  10  PM 30 tablet 0   triamcinolone cream (KENALOG) 0.1 % Apply 1 application topically 2 (two) times daily as needed (eczema).     No current facility-administered medications for this visit.    No Known Allergies  Review of Systems  Constitutional:  Positive for activity change and fatigue. Negative for unexpected weight change.  HENT:  Negative for trouble swallowing and voice change.   Eyes:  Negative for visual disturbance.  Respiratory:  Negative for cough, shortness of breath and wheezing.   Cardiovascular:  Negative for chest pain and leg swelling.  Gastrointestinal:  Positive for abdominal pain (Reflux).  Genitourinary:  Negative for difficulty urinating and dysuria.  Musculoskeletal:  Negative for arthralgias and gait problem.  Neurological:  Negative for dizziness, syncope and weakness.  Hematological:  Negative for adenopathy. Does not bruise/bleed easily.   BP (!) 157/90   Pulse 74   Resp 20   Ht 5\' 6"  (1.676 m)   Wt 169 lb (76.7 kg)   SpO2 96%   BMI 27.28 kg/m  Physical Exam Vitals reviewed.  Constitutional:      General: He is not in acute distress.    Appearance: Normal appearance.  HENT:     Head: Normocephalic and atraumatic.  Eyes:     General: No scleral icterus.    Extraocular Movements: Extraocular movements intact.  Neck:     Vascular: Carotid bruit (Bruit versus transmitted murmur) present.  Cardiovascular:     Rate and Rhythm: Normal rate and regular rhythm.     Heart sounds: Murmur (3/6 crescendo decrescendo right upper sternal border) heard.  Pulmonary:     Effort: Pulmonary effort is normal. No respiratory distress.     Breath sounds: Normal breath sounds. No wheezing.  Abdominal:     General: There is no distension.     Palpations: Abdomen is soft.  Skin:    General: Skin is warm and dry.  Neurological:      General: No focal deficit present.     Mental Status: He is alert and oriented to person, place, and time.     Cranial Nerves: No cranial nerve deficit.     Motor: No weakness.  No peripheral edema   Diagnostic Tests: Cardiac catheterization 02/06/2021 LM: Distal calcific 75% stenosis LAD: Mid 80% stenosis LCX: large vessel. Ostial 70% stenosis.  RCA: Dominant, but small vessel. No significant disease seen on well opacified non-selective angiogram.   Normal PCW Unable to cross aortic  valve due to severe aorta tortuosity.   CVTS consult for CABG +/- AVR  Echocardiogram 12/07/2020:  Moderately depressed LV systolic function with visual EF 30-35%. Left  ventricle cavity is dilated. Moderate concentric hypertrophy of the left  ventricle. Hypokinetic global wall motion. Doppler evidence of grade II  (pseudonormal) diastolic dysfunction, elevated LAP.  Left atrial cavity is mildly dilated.  Mild aortic stenosis (peak velocity 2.20m/s, Peak gradient 65mmHg, Mean  gradient 17mmHg, AVA 0.9cm2, DI 0.30) severity of AS may be under  appreciated due to low flow low gradient AS.  Moderate (Grade III) aortic regurgitation.  Moderate (Grade II) mitral regurgitation.  Mild tricuspid regurgitation. Mild pulmonary hypertension. RVSP measures  44 mmHg.  IVC is normal with a respiratory response of <50%.  Compared to study 05/25/2019: LVEF 50-55% is now 30-35%, G1DD is now G2DD  with elevated LAP, PHTN is new., otherwise no significant change  I personally reviewed the catheterization images which show left main disease. I personally reviewed the echocardiogram images which show heavily calcified aortic valve.  Although gradients mild, in setting of low EF I suspect he has severe AAS.  His calculated valve area was 0.9 cm.  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  He recently was having issues with congestive heart failure.  That led to repeat echocardiogram and then cardiac catheterization.  At catheterization he has left main coronary disease.  Although his valve gradients are not particularly high, his valve is very calcified and his calculated valve area is tight at 0.9 cm.  I think he has a least moderate to severe aortic stenosis.  Coronary bypass grafting with aortic valve replacement is indicated for survival benefit.  I informed Mr. and Mrs. Andres of the general nature of the procedure.  They understand the need for general anesthesia, the incisions to be used, the use of cardiopulmonary bypass, the use of drainage tubes and temporary pacemaker wires postoperatively, the expected hospital stay, and the overall recovery.  I informed them of the indications, risks, benefits, and alternatives.  They understand the risks include, but not limited to death, MI, DVT, PE, stroke, bleeding, possible need for transfusion, infection, cardiac arrhythmias, heart block from pacemaker, respiratory or renal failure, as well as possibility of other unforeseeable complications.  He accepts the risks and agrees to proceed.  We did discuss the advantages and disadvantages of mechanical versus tissue valves.  At his age a tissue valve is the preferred alternative.  He is in agreement with that.  He has not seen a dentist in several years.  He needs to see a dentist prior to surgery.  Plan: Dental eval Tentatively plan for coronary bypass grafting x2 and aortic valve replacement on Thursday, 03/01/2021  [name], MD [facility] [phone]

## 2021-02-22 ENCOUNTER — Encounter: Payer: Medicare HMO | Admitting: Thoracic Surgery (Cardiothoracic Vascular Surgery)

## 2021-02-26 NOTE — Progress Notes (Signed)
Surgical Instructions    Your procedure is scheduled on Thursday, December 8th, 2022.   Report to [facility] Main Entrance "A" at 06:00 A.M., then check in with the Admitting office.  Call this number if you have problems the morning of surgery:  [phone]   If you have any questions prior to your surgery date call [phone]: Open Monday-Friday 8am-4pm    Remember:  Do not eat or drink after midnight the night before your surgery    Take these medicines the morning of surgery with A SIP OF WATER:  atorvastatin (LIPITOR) Guaifenesin (MUCINEX MAXIMUM STRENGTH)  metoprolol succinate (TOPROL-XL)   If needed:   acetaminophen (TYLENOL)   Follow your surgeon's instructions on when to stop Aspirin.  If no instructions were given by your surgeon then you will need to call the office to get those instructions.     As of today, STOP taking any Aspirin (unless otherwise instructed by your surgeon) Aleve, Naproxen, Ibuprofen, Motrin, Advil, Goody's, BC's, all herbal medications, fish oil, and all vitamins.   WHAT DO I DO ABOUT MY DIABETES MEDICATION?   Do not take dapagliflozin propanediol (FARXIGA) the day before surgery and the day of surger   HOW TO MANAGE YOUR DIABETES BEFORE AND AFTER SURGERY  Why is it important to control my blood sugar before and after surgery? Improving blood sugar levels before and after surgery helps healing and can limit problems. A way of improving blood sugar control is eating a healthy diet by:  Eating less sugar and carbohydrates  Increasing activity/exercise  Talking with your doctor about reaching your blood sugar goals High blood sugars (greater than 180 mg/dL) can raise your risk of infections and slow your recovery, so you will need to focus on controlling your diabetes during the weeks before surgery. Make sure that the doctor who takes care of your diabetes knows about your planned surgery including the date and location.  How do I  manage my blood sugar before surgery? Check your blood sugar at least 4 times a day, starting 2 days before surgery, to make sure that the level is not too high or low.  Check your blood sugar the morning of your surgery when you wake up and every 2 hours until you get to the Short Stay unit.  If your blood sugar is less than 70 mg/dL, you will need to treat for low blood sugar: Do not take insulin. Treat a low blood sugar (less than 70 mg/dL) with  cup of clear juice (cranberry or apple), 4 glucose tablets, OR glucose gel. Recheck blood sugar in 15 minutes after treatment (to make sure it is greater than 70 mg/dL). If your blood sugar is not greater than 70 mg/dL on recheck, call [phone] for further instructions. Report your blood sugar to the short stay nurse when you get to Short Stay.  If you are admitted to the hospital after surgery: Your blood sugar will be checked by the staff and you will probably be given insulin after surgery (instead of oral diabetes medicines) to make sure you have good blood sugar levels. The goal for blood sugar control after surgery is 80-180 mg/dL.    After your COVID test   You are not required to quarantine however you are required to wear a well-fitting mask when you are out and around people not in your household.  If your mask becomes wet or soiled, replace with a new one.  Wash your hands often with soap and  water for 20 seconds or clean your hands with an alcohol-based hand sanitizer that contains at least 60% alcohol.  Do not share personal items.  Notify your provider: if you are in close contact with someone who has COVID  or if you develop a fever of 100.4 or greater, sneezing, cough, sore throat, shortness of breath or body aches.   The day of surgery:          Do not wear jewelry  Do not wear lotions, powders, colognes, or deodorant. Men may shave face and neck. Do not bring valuables to the hospital. DO Not wear nail polish, gel  polish, artificial nails, or any other type of covering on natural nails including finger and toenails.             [facility] is not responsible for any belongings or valuables.  Do NOT Smoke (Tobacco/Vaping)  24 hours prior to your procedure  If you use a CPAP at night, you may bring your mask for your overnight stay.   Contacts, glasses, hearing aids, dentures or partials may not be worn into surgery, please bring cases for these belongings   For patients admitted to the hospital, discharge time will be determined by your treatment team.   Patients discharged the day of surgery will not be allowed to drive home, and someone needs to stay with them for 24 hours.  NO VISITORS WILL BE ALLOWED IN PRE-OP WHERE PATIENTS ARE PREPPED FOR SURGERY.  ONLY 1 SUPPORT PERSON MAY BE PRESENT IN THE WAITING ROOM WHILE YOU ARE IN SURGERY.  IF YOU ARE TO BE ADMITTED, ONCE YOU ARE IN YOUR ROOM YOU WILL BE ALLOWED TWO (2) VISITORS. 1 (ONE) VISITOR MAY STAY OVERNIGHT BUT MUST ARRIVE TO THE ROOM BY 8pm.  Minor children may have two parents present. Special consideration for safety and communication needs will be reviewed on a case by case basis.  Special instructions:    Oral Hygiene is also important to reduce your risk of infection.  Remember - BRUSH YOUR TEETH THE MORNING OF SURGERY WITH YOUR REGULAR TOOTHPASTE   [facility]- Preparing For Surgery  Before surgery, you can play an important role. Because skin is not sterile, your skin needs to be as free of germs as possible. You can reduce the number of germs on your skin by washing with CHG (chlorahexidine gluconate) Soap before surgery.  CHG is an antiseptic cleaner which kills germs and bonds with the skin to continue killing germs even after washing.     Please do not use if you have an allergy to CHG or antibacterial soaps. If your skin becomes reddened/irritated stop using the CHG.  Do not shave (including legs and underarms) for at least 48 hours  prior to first CHG shower. It is OK to shave your face.  Please follow these instructions carefully.     Shower the NIGHT BEFORE SURGERY and the MORNING OF SURGERY with CHG Soap.   If you chose to wash your hair, wash your hair first as usual with your normal shampoo. After you shampoo, rinse your hair and body thoroughly to remove the shampoo.  Then [employer] and genitals (private parts) with your normal soap and rinse thoroughly to remove soap.  After that Use CHG Soap as you would any other liquid soap. You can apply CHG directly to the skin and wash gently with a scrungie or a clean washcloth.   Apply the CHG Soap to your body ONLY FROM THE  NECK DOWN.  Do not use on open wounds or open sores. Avoid contact with your eyes, ears, mouth and genitals (private parts). Wash Face and genitals (private parts)  with your normal soap.   Wash thoroughly, paying special attention to the area where your surgery will be performed.  Thoroughly rinse your body with warm water from the neck down.  DO NOT shower/wash with your normal soap after using and rinsing off the CHG Soap.  Pat yourself dry with a CLEAN TOWEL.  Wear CLEAN PAJAMAS to bed the night before surgery  Place CLEAN SHEETS on your bed the night before your surgery  DO NOT SLEEP WITH PETS.   Day of Surgery:  Take a shower with CHG soap. Wear Clean/Comfortable clothing the morning of surgery Do not apply any deodorants/lotions.   Remember to brush your teeth WITH YOUR REGULAR TOOTHPASTE.   Please read over the following fact sheets that you were given.

## 2021-02-27 ENCOUNTER — Ambulatory Visit (HOSPITAL_COMMUNITY)
Admission: RE | Admit: 2021-02-27 | Discharge: 2021-02-27 | Disposition: A | Discharge status: Home / Self Care | Payer: Medicare HMO | Source: Ambulatory Visit | Attending: Thoracic Surgery (Cardiothoracic Vascular Surgery) | Admitting: Thoracic Surgery (Cardiothoracic Vascular Surgery)

## 2021-02-27 ENCOUNTER — Other Ambulatory Visit: Payer: Self-pay

## 2021-02-27 ENCOUNTER — Encounter (HOSPITAL_COMMUNITY)
Admission: RE | Admit: 2021-02-27 | Discharge: 2021-02-27 | Disposition: A | Discharge status: Home / Self Care | Payer: Medicare HMO | Source: Ambulatory Visit | Attending: Thoracic Surgery (Cardiothoracic Vascular Surgery) | Admitting: Thoracic Surgery (Cardiothoracic Vascular Surgery)

## 2021-02-27 ENCOUNTER — Ambulatory Visit (HOSPITAL_BASED_OUTPATIENT_CLINIC_OR_DEPARTMENT_OTHER)
Admission: RE | Admit: 2021-02-27 | Discharge: 2021-02-27 | Disposition: A | Discharge status: Home / Self Care | Payer: Medicare HMO | Source: Ambulatory Visit | Attending: Thoracic Surgery (Cardiothoracic Vascular Surgery) | Admitting: Thoracic Surgery (Cardiothoracic Vascular Surgery)

## 2021-02-27 ENCOUNTER — Encounter (HOSPITAL_COMMUNITY): Payer: Self-pay

## 2021-02-27 VITALS — BP 160/91 | HR 75 | Temp 97.9°F | Resp 18

## 2021-02-27 DIAGNOSIS — E119 Type 2 diabetes mellitus without complications: Secondary | ICD-10-CM | POA: Insufficient documentation

## 2021-02-27 DIAGNOSIS — I509 Heart failure, unspecified: Secondary | ICD-10-CM | POA: Insufficient documentation

## 2021-02-27 DIAGNOSIS — Z01818 Encounter for other preprocedural examination: Secondary | ICD-10-CM

## 2021-02-27 DIAGNOSIS — Z87891 Personal history of nicotine dependence: Secondary | ICD-10-CM | POA: Insufficient documentation

## 2021-02-27 DIAGNOSIS — E785 Hyperlipidemia, unspecified: Secondary | ICD-10-CM | POA: Insufficient documentation

## 2021-02-27 DIAGNOSIS — Z20822 Contact with and (suspected) exposure to covid-19: Secondary | ICD-10-CM | POA: Insufficient documentation

## 2021-02-27 DIAGNOSIS — I110 Hypertensive heart disease with heart failure: Secondary | ICD-10-CM | POA: Insufficient documentation

## 2021-02-27 DIAGNOSIS — I2510 Atherosclerotic heart disease of native coronary artery without angina pectoris: Secondary | ICD-10-CM

## 2021-02-27 DIAGNOSIS — I350 Nonrheumatic aortic (valve) stenosis: Secondary | ICD-10-CM | POA: Diagnosis not present

## 2021-02-27 LAB — BLOOD GAS, ARTERIAL
Acid-base deficit: 2.8 mmol/L — ABNORMAL HIGH (ref 0.0–2.0)
Bicarbonate: 21.3 mmol/L (ref 20.0–28.0)
FIO2: 21
O2 Saturation: 96.9 %
Patient temperature: 37
pCO2 arterial: 35.4 mmHg (ref 32.0–48.0)
pH, Arterial: 7.397 (ref 7.350–7.450)
pO2, Arterial: 92.7 mmHg (ref 83.0–108.0)

## 2021-02-27 LAB — COMPREHENSIVE METABOLIC PANEL
ALT: 18 U/L (ref 0–44)
AST: 22 U/L (ref 15–41)
Albumin: 3.7 g/dL (ref 3.5–5.0)
Alkaline Phosphatase: 59 U/L (ref 38–126)
Anion gap: 11 (ref 5–15)
BUN: 17 mg/dL (ref 8–23)
CO2: 18 mmol/L — ABNORMAL LOW (ref 22–32)
Calcium: 8.9 mg/dL (ref 8.9–10.3)
Chloride: 108 mmol/L (ref 98–111)
Creatinine, Ser: 1.09 mg/dL (ref 0.61–1.24)
GFR, Estimated: >60 mL/min (ref >60)
Glucose, Bld: 100 mg/dL — ABNORMAL HIGH (ref 70–99)
Potassium: 4.3 mmol/L (ref 3.5–5.1)
Sodium: 137 mmol/L (ref 135–145)
Total Bilirubin: 0.7 mg/dL (ref 0.3–1.2)
Total Protein: 6.8 g/dL (ref 6.5–8.1)

## 2021-02-27 LAB — URINALYSIS, ROUTINE W REFLEX MICROSCOPIC
Bilirubin Urine: NEGATIVE
Glucose, UA: >=500 mg/dL — AB
Hgb urine dipstick: NEGATIVE
Ketones, ur: NEGATIVE mg/dL
Nitrite: NEGATIVE
Protein, ur: NEGATIVE mg/dL
Specific Gravity, Urine: 1.025 (ref 1.005–1.030)
pH: 6 (ref 5.0–8.0)

## 2021-02-27 LAB — PROTIME-INR
INR: 1.1 (ref 0.8–1.2)
Prothrombin Time: 13.8 seconds (ref 11.4–15.2)

## 2021-02-27 LAB — CBC
HCT: 42.3 % (ref 39.0–52.0)
Hemoglobin: 12.7 g/dL — ABNORMAL LOW (ref 13.0–17.0)
MCH: 23.6 pg — ABNORMAL LOW (ref 26.0–34.0)
MCHC: 30 g/dL (ref 30.0–36.0)
MCV: 78.8 fL — ABNORMAL LOW (ref 80.0–100.0)
Platelets: 334 10*3/uL (ref 150–400)
RBC: 5.37 MIL/uL (ref 4.22–5.81)
RDW: 16.7 % — ABNORMAL HIGH (ref 11.5–15.5)
WBC: 10.1 10*3/uL (ref 4.0–10.5)
nRBC: 0 % (ref 0.0–0.2)

## 2021-02-27 LAB — SURGICAL PCR SCREEN
MRSA, PCR: NEGATIVE
Staphylococcus aureus: POSITIVE — AB

## 2021-02-27 LAB — URINALYSIS, MICROSCOPIC (REFLEX): Bacteria, UA: NONE SEEN

## 2021-02-27 LAB — HEMOGLOBIN A1C
Hgb A1c MFr Bld: 6.3 % — ABNORMAL HIGH (ref 4.8–5.6)
Mean Plasma Glucose: 134.11 mg/dL

## 2021-02-27 LAB — APTT: aPTT: 33 seconds (ref 24–36)

## 2021-02-27 LAB — GLUCOSE, CAPILLARY: Glucose-Capillary: 101 mg/dL — ABNORMAL HIGH (ref 70–99)

## 2021-02-27 NOTE — Progress Notes (Signed)
PCP - Bing Matter, PA-C Cardiologist - Rex Kras, MD  PPM/ICD - denies Device Orders - n/a Rep Notified - n/a  Chest x-ray - 02/27/2021 EKG - 02/27/2021 Stress Test - 11/11/2014 ECHO - 12/07/2020 Cardiac Cath - 02/06/2021  Sleep Study - denies CPAP - n/a  CBG today - 101 - patient verbalized that he has pre-diabetes; is not checking CBG at home.   Blood Thinner Instructions: n/a  Aspirin Instructions: Aspirin - hold the day of surgery  Patient was instructed: As of today, STOP taking any Aspirin (unless otherwise instructed by your surgeon) Aleve, Naproxen, Ibuprofen, Motrin, Advil, Goody's, BC's, all herbal medications, fish oil, and all vitamins.  ERAS Protcol - no PRE-SURGERY Ensure or G2- n/a  COVID TEST- done in PAT on 02/27/2021   Anesthesia review: yes - cardiac history  When patient arrived in PAT his BP was 169/94. BP decreased to 160/91 before patient left  Patient denies shortness of breath, fever, cough and chest pain at PAT appointment   All instructions explained to the patient, with a verbal understanding of the material. Patient agrees to go over the instructions while at home for a better understanding. Patient also instructed to self quarantine after being tested for COVID-19. The opportunity to ask questions was provided.

## 2021-02-28 ENCOUNTER — Encounter (HOSPITAL_COMMUNITY): Payer: Self-pay | Admitting: Thoracic Surgery (Cardiothoracic Vascular Surgery)

## 2021-02-28 LAB — SARS CORONAVIRUS 2 (TAT 6-24 HRS): SARS Coronavirus 2: NEGATIVE

## 2021-02-28 MED ORDER — VANCOMYCIN HCL 1250 MG/250ML IV SOLN
1250.0000 mg | INTRAVENOUS | Status: AC
  Administered 2021-03-01: 1250 mg via INTRAVENOUS
  Filled 2021-02-28: qty 250

## 2021-02-28 MED ORDER — DEXMEDETOMIDINE HCL IN NACL 400 MCG/100ML IV SOLN
0.1000 ug/kg/h | INTRAVENOUS | Status: AC
  Administered 2021-03-01: .4 ug/kg/h via INTRAVENOUS
  Filled 2021-02-28: qty 100

## 2021-02-28 MED ORDER — TRANEXAMIC ACID (OHS) PUMP PRIME SOLUTION
2.0000 mg/kg | INTRAVENOUS | Status: DC
  Filled 2021-02-28: qty 1.53

## 2021-02-28 MED ORDER — MILRINONE LACTATE IN DEXTROSE 20-5 MG/100ML-% IV SOLN
0.3000 ug/kg/min | INTRAVENOUS | Status: DC
  Filled 2021-02-28: qty 100

## 2021-02-28 MED ORDER — TRANEXAMIC ACID 1000 MG/10ML IV SOLN
1.5000 mg/kg/h | INTRAVENOUS | Status: AC
  Administered 2021-03-01: 1.5 mg/kg/h via INTRAVENOUS
  Filled 2021-02-28: qty 25

## 2021-02-28 MED ORDER — NITROGLYCERIN IN D5W 200-5 MCG/ML-% IV SOLN
2.0000 ug/min | INTRAVENOUS | Status: DC
  Filled 2021-02-28: qty 250

## 2021-02-28 MED ORDER — CEFAZOLIN SODIUM-DEXTROSE 2-4 GM/100ML-% IV SOLN
2.0000 g | INTRAVENOUS | Status: AC
  Administered 2021-03-01: 2 g via INTRAVENOUS
  Filled 2021-02-28: qty 100

## 2021-02-28 MED ORDER — TRANEXAMIC ACID (OHS) BOLUS VIA INFUSION
15.0000 mg/kg | INTRAVENOUS | Status: AC
  Administered 2021-03-01: 1150.5 mg via INTRAVENOUS
  Filled 2021-02-28: qty 1151

## 2021-02-28 MED ORDER — PLASMA-LYTE A IV SOLN
INTRAVENOUS | Status: DC
  Filled 2021-02-28: qty 5

## 2021-02-28 MED ORDER — POTASSIUM CHLORIDE 2 MEQ/ML IV SOLN
80.0000 meq | INTRAVENOUS | Status: DC
  Filled 2021-02-28: qty 40

## 2021-02-28 MED ORDER — NOREPINEPHRINE 4 MG/250ML-% IV SOLN
0.0000 ug/min | INTRAVENOUS | Status: DC
  Filled 2021-02-28: qty 250

## 2021-02-28 MED ORDER — INSULIN REGULAR(HUMAN) IN NACL 100-0.9 UT/100ML-% IV SOLN
INTRAVENOUS | Status: AC
  Administered 2021-03-01: 1.2 [IU]/h via INTRAVENOUS
  Filled 2021-02-28: qty 100

## 2021-02-28 MED ORDER — PHENYLEPHRINE HCL-NACL 20-0.9 MG/250ML-% IV SOLN
30.0000 ug/min | INTRAVENOUS | Status: AC
  Administered 2021-03-01: 20 ug/min via INTRAVENOUS
  Filled 2021-02-28: qty 250

## 2021-02-28 MED ORDER — MAGNESIUM SULFATE 50 % IJ SOLN
40.0000 meq | INTRAMUSCULAR | Status: DC
  Filled 2021-02-28: qty 9.85

## 2021-02-28 MED ORDER — EPINEPHRINE HCL 5 MG/250ML IV SOLN IN NS
0.0000 ug/min | INTRAVENOUS | Status: DC
Start: 2021-03-01 — End: 2021-03-01
  Filled 2021-02-28: qty 250

## 2021-02-28 MED ORDER — HEPARIN 30,000 UNITS/1000 ML (OHS) CELLSAVER SOLUTION
Status: DC
  Filled 2021-02-28: qty 1000

## 2021-03-01 ENCOUNTER — Inpatient Hospital Stay (HOSPITAL_COMMUNITY): Payer: Medicare HMO

## 2021-03-01 ENCOUNTER — Inpatient Hospital Stay (HOSPITAL_COMMUNITY): Payer: Medicare HMO | Admitting: Anesthesiology

## 2021-03-01 ENCOUNTER — Encounter (HOSPITAL_COMMUNITY): Payer: Self-pay | Admitting: Thoracic Surgery (Cardiothoracic Vascular Surgery)

## 2021-03-01 ENCOUNTER — Inpatient Hospital Stay (HOSPITAL_COMMUNITY)
Admission: RE | Admit: 2021-03-01 | Discharge: 2021-03-12 | DRG: 219 | Disposition: A | Discharge status: Home Health | Payer: Medicare HMO | Attending: Thoracic Surgery (Cardiothoracic Vascular Surgery) | Admitting: Thoracic Surgery (Cardiothoracic Vascular Surgery)

## 2021-03-01 ENCOUNTER — Inpatient Hospital Stay (HOSPITAL_COMMUNITY)
Admission: RE | Disposition: A | Discharge status: Home Health | Payer: Self-pay | Source: Home / Self Care | Attending: Thoracic Surgery (Cardiothoracic Vascular Surgery)

## 2021-03-01 DIAGNOSIS — E871 Hypo-osmolality and hyponatremia: Secondary | ICD-10-CM | POA: Diagnosis present

## 2021-03-01 DIAGNOSIS — E7800 Pure hypercholesterolemia, unspecified: Secondary | ICD-10-CM | POA: Diagnosis present

## 2021-03-01 DIAGNOSIS — N1831 Chronic kidney disease, stage 3a: Secondary | ICD-10-CM | POA: Diagnosis present

## 2021-03-01 DIAGNOSIS — I480 Paroxysmal atrial fibrillation: Secondary | ICD-10-CM | POA: Diagnosis present

## 2021-03-01 DIAGNOSIS — T8111XA Postprocedural  cardiogenic shock, initial encounter: Secondary | ICD-10-CM | POA: Diagnosis not present

## 2021-03-01 DIAGNOSIS — T502X5A Adverse effect of carbonic-anhydrase inhibitors, benzothiadiazides and other diuretics, initial encounter: Secondary | ICD-10-CM | POA: Diagnosis not present

## 2021-03-01 DIAGNOSIS — I442 Atrioventricular block, complete: Secondary | ICD-10-CM | POA: Diagnosis not present

## 2021-03-01 DIAGNOSIS — Z952 Presence of prosthetic heart valve: Secondary | ICD-10-CM

## 2021-03-01 DIAGNOSIS — I5043 Acute on chronic combined systolic (congestive) and diastolic (congestive) heart failure: Secondary | ICD-10-CM | POA: Diagnosis not present

## 2021-03-01 DIAGNOSIS — I495 Sick sinus syndrome: Secondary | ICD-10-CM | POA: Diagnosis not present

## 2021-03-01 DIAGNOSIS — J90 Pleural effusion, not elsewhere classified: Secondary | ICD-10-CM | POA: Diagnosis not present

## 2021-03-01 DIAGNOSIS — N179 Acute kidney failure, unspecified: Secondary | ICD-10-CM

## 2021-03-01 DIAGNOSIS — J45909 Unspecified asthma, uncomplicated: Secondary | ICD-10-CM | POA: Diagnosis present

## 2021-03-01 DIAGNOSIS — R008 Other abnormalities of heart beat: Secondary | ICD-10-CM | POA: Diagnosis not present

## 2021-03-01 DIAGNOSIS — I5031 Acute diastolic (congestive) heart failure: Secondary | ICD-10-CM

## 2021-03-01 DIAGNOSIS — Z79899 Other long term (current) drug therapy: Secondary | ICD-10-CM

## 2021-03-01 DIAGNOSIS — Z6830 Body mass index (BMI) 30.0-30.9, adult: Secondary | ICD-10-CM | POA: Diagnosis not present

## 2021-03-01 DIAGNOSIS — I97411 Intraoperative hemorrhage and hematoma of a circulatory system organ or structure complicating a cardiac bypass: Secondary | ICD-10-CM | POA: Diagnosis not present

## 2021-03-01 DIAGNOSIS — I97190 Other postprocedural cardiac functional disturbances following cardiac surgery: Secondary | ICD-10-CM | POA: Diagnosis not present

## 2021-03-01 DIAGNOSIS — I97418 Intraoperative hemorrhage and hematoma of a circulatory system organ or structure complicating other circulatory system procedure: Secondary | ICD-10-CM | POA: Diagnosis not present

## 2021-03-01 DIAGNOSIS — I2510 Atherosclerotic heart disease of native coronary artery without angina pectoris: Secondary | ICD-10-CM

## 2021-03-01 DIAGNOSIS — Z006 Encounter for examination for normal comparison and control in clinical research program: Secondary | ICD-10-CM | POA: Diagnosis not present

## 2021-03-01 DIAGNOSIS — E669 Obesity, unspecified: Secondary | ICD-10-CM | POA: Diagnosis present

## 2021-03-01 DIAGNOSIS — I4891 Unspecified atrial fibrillation: Secondary | ICD-10-CM | POA: Diagnosis not present

## 2021-03-01 DIAGNOSIS — Y832 Surgical operation with anastomosis, bypass or graft as the cause of abnormal reaction of the patient, or of later complication, without mention of misadventure at the time of the procedure: Secondary | ICD-10-CM | POA: Diagnosis not present

## 2021-03-01 DIAGNOSIS — D62 Acute posthemorrhagic anemia: Secondary | ICD-10-CM | POA: Diagnosis not present

## 2021-03-01 DIAGNOSIS — Y838 Other surgical procedures as the cause of abnormal reaction of the patient, or of later complication, without mention of misadventure at the time of the procedure: Secondary | ICD-10-CM | POA: Diagnosis not present

## 2021-03-01 DIAGNOSIS — Z87891 Personal history of nicotine dependence: Secondary | ICD-10-CM

## 2021-03-01 DIAGNOSIS — E1122 Type 2 diabetes mellitus with diabetic chronic kidney disease: Secondary | ICD-10-CM | POA: Diagnosis present

## 2021-03-01 DIAGNOSIS — I350 Nonrheumatic aortic (valve) stenosis: Principal | ICD-10-CM | POA: Diagnosis present

## 2021-03-01 DIAGNOSIS — N170 Acute kidney failure with tubular necrosis: Secondary | ICD-10-CM | POA: Diagnosis not present

## 2021-03-01 DIAGNOSIS — I9581 Postprocedural hypotension: Secondary | ICD-10-CM | POA: Diagnosis not present

## 2021-03-01 DIAGNOSIS — R011 Cardiac murmur, unspecified: Secondary | ICD-10-CM | POA: Diagnosis present

## 2021-03-01 DIAGNOSIS — J95821 Acute postprocedural respiratory failure: Secondary | ICD-10-CM | POA: Diagnosis not present

## 2021-03-01 DIAGNOSIS — I130 Hypertensive heart and chronic kidney disease with heart failure and stage 1 through stage 4 chronic kidney disease, or unspecified chronic kidney disease: Secondary | ICD-10-CM | POA: Diagnosis present

## 2021-03-01 DIAGNOSIS — Z20822 Contact with and (suspected) exposure to covid-19: Secondary | ICD-10-CM | POA: Diagnosis present

## 2021-03-01 DIAGNOSIS — M1990 Unspecified osteoarthritis, unspecified site: Secondary | ICD-10-CM | POA: Diagnosis present

## 2021-03-01 DIAGNOSIS — Z951 Presence of aortocoronary bypass graft: Secondary | ICD-10-CM | POA: Diagnosis not present

## 2021-03-01 DIAGNOSIS — J9811 Atelectasis: Secondary | ICD-10-CM | POA: Diagnosis not present

## 2021-03-01 DIAGNOSIS — J810 Acute pulmonary edema: Secondary | ICD-10-CM | POA: Diagnosis not present

## 2021-03-01 DIAGNOSIS — M109 Gout, unspecified: Secondary | ICD-10-CM | POA: Diagnosis present

## 2021-03-01 DIAGNOSIS — J9601 Acute respiratory failure with hypoxia: Secondary | ICD-10-CM | POA: Diagnosis not present

## 2021-03-01 DIAGNOSIS — N1832 Chronic kidney disease, stage 3b: Secondary | ICD-10-CM

## 2021-03-01 DIAGNOSIS — I428 Other cardiomyopathies: Secondary | ICD-10-CM | POA: Diagnosis present

## 2021-03-01 DIAGNOSIS — L8990 Pressure ulcer of unspecified site, unspecified stage: Secondary | ICD-10-CM | POA: Insufficient documentation

## 2021-03-01 DIAGNOSIS — I9751 Accidental puncture and laceration of a circulatory system organ or structure during a circulatory system procedure: Secondary | ICD-10-CM | POA: Diagnosis not present

## 2021-03-01 DIAGNOSIS — N5089 Other specified disorders of the male genital organs: Secondary | ICD-10-CM | POA: Diagnosis not present

## 2021-03-01 DIAGNOSIS — K219 Gastro-esophageal reflux disease without esophagitis: Secondary | ICD-10-CM | POA: Diagnosis present

## 2021-03-01 DIAGNOSIS — Z7901 Long term (current) use of anticoagulants: Secondary | ICD-10-CM

## 2021-03-01 DIAGNOSIS — Z7982 Long term (current) use of aspirin: Secondary | ICD-10-CM

## 2021-03-01 HISTORY — PX: ENDOVEIN HARVEST OF GREATER SAPHENOUS VEIN: SHX5059

## 2021-03-01 HISTORY — PX: CHEST EXPLORATION: SHX5104

## 2021-03-01 HISTORY — PX: CORONARY ARTERY BYPASS GRAFT: SHX141

## 2021-03-01 HISTORY — PX: AORTIC VALVE REPLACEMENT: SHX41

## 2021-03-01 HISTORY — PX: TEE WITHOUT CARDIOVERSION: SHX5443

## 2021-03-01 LAB — POCT I-STAT 7, (LYTES, BLD GAS, ICA,H+H)
Acid-Base Excess: 0 mmol/L (ref 0.0–2.0)
Acid-Base Excess: 1 mmol/L (ref 0.0–2.0)
Acid-Base Excess: 0 mmol/L (ref 0.0–2.0)
Acid-Base Excess: 0 mmol/L (ref 0.0–2.0)
Acid-Base Excess: 0 mmol/L (ref 0.0–2.0)
Acid-base deficit: 4 mmol/L — ABNORMAL HIGH (ref 0.0–2.0)
Acid-base deficit: 6 mmol/L — ABNORMAL HIGH (ref 0.0–2.0)
Acid-base deficit: 5 mmol/L — ABNORMAL HIGH (ref 0.0–2.0)
Acid-base deficit: 6 mmol/L — ABNORMAL HIGH (ref 0.0–2.0)
Acid-base deficit: 4 mmol/L — ABNORMAL HIGH (ref 0.0–2.0)
Bicarbonate: 23.6 mmol/L (ref 20.0–28.0)
Bicarbonate: 24.9 mmol/L (ref 20.0–28.0)
Bicarbonate: 24.3 mmol/L (ref 20.0–28.0)
Bicarbonate: 24.4 mmol/L (ref 20.0–28.0)
Bicarbonate: 24.8 mmol/L (ref 20.0–28.0)
Bicarbonate: 19.9 mmol/L — ABNORMAL LOW (ref 20.0–28.0)
Bicarbonate: 21.9 mmol/L (ref 20.0–28.0)
Bicarbonate: 20.3 mmol/L (ref 20.0–28.0)
Bicarbonate: 19.2 mmol/L — ABNORMAL LOW (ref 20.0–28.0)
Bicarbonate: 20.7 mmol/L (ref 20.0–28.0)
Calcium, Ion: 0.97 mmol/L — ABNORMAL LOW (ref 1.15–1.40)
Calcium, Ion: 1.03 mmol/L — ABNORMAL LOW (ref 1.15–1.40)
Calcium, Ion: 1.02 mmol/L — ABNORMAL LOW (ref 1.15–1.40)
Calcium, Ion: 1.03 mmol/L — ABNORMAL LOW (ref 1.15–1.40)
Calcium, Ion: 1.06 mmol/L — ABNORMAL LOW (ref 1.15–1.40)
Calcium, Ion: 1.15 mmol/L (ref 1.15–1.40)
Calcium, Ion: 1.23 mmol/L (ref 1.15–1.40)
Calcium, Ion: 1.13 mmol/L — ABNORMAL LOW (ref 1.15–1.40)
Calcium, Ion: 1.37 mmol/L (ref 1.15–1.40)
Calcium, Ion: 1.28 mmol/L (ref 1.15–1.40)
HCT: 26 % — ABNORMAL LOW (ref 39.0–52.0)
HCT: 26 % — ABNORMAL LOW (ref 39.0–52.0)
HCT: 24 % — ABNORMAL LOW (ref 39.0–52.0)
HCT: 26 % — ABNORMAL LOW (ref 39.0–52.0)
HCT: 26 % — ABNORMAL LOW (ref 39.0–52.0)
HCT: 26 % — ABNORMAL LOW (ref 39.0–52.0)
HCT: 29 % — ABNORMAL LOW (ref 39.0–52.0)
HCT: 34 % — ABNORMAL LOW (ref 39.0–52.0)
HCT: 27 % — ABNORMAL LOW (ref 39.0–52.0)
HCT: 28 % — ABNORMAL LOW (ref 39.0–52.0)
Hemoglobin: 8.8 g/dL — ABNORMAL LOW (ref 13.0–17.0)
Hemoglobin: 8.8 g/dL — ABNORMAL LOW (ref 13.0–17.0)
Hemoglobin: 8.2 g/dL — ABNORMAL LOW (ref 13.0–17.0)
Hemoglobin: 8.8 g/dL — ABNORMAL LOW (ref 13.0–17.0)
Hemoglobin: 8.8 g/dL — ABNORMAL LOW (ref 13.0–17.0)
Hemoglobin: 8.8 g/dL — ABNORMAL LOW (ref 13.0–17.0)
Hemoglobin: 9.9 g/dL — ABNORMAL LOW (ref 13.0–17.0)
Hemoglobin: 11.6 g/dL — ABNORMAL LOW (ref 13.0–17.0)
Hemoglobin: 9.2 g/dL — ABNORMAL LOW (ref 13.0–17.0)
Hemoglobin: 9.5 g/dL — ABNORMAL LOW (ref 13.0–17.0)
O2 Saturation: 100 %
O2 Saturation: 100 %
O2 Saturation: 100 %
O2 Saturation: 100 %
O2 Saturation: 100 %
O2 Saturation: 100 %
O2 Saturation: 100 %
O2 Saturation: 99 %
O2 Saturation: 98 %
O2 Saturation: 95 %
Patient temperature: 36.6
Patient temperature: 36.2
Patient temperature: 36.5
Patient temperature: 36.7
Potassium: 4.6 mmol/L (ref 3.5–5.1)
Potassium: 5.1 mmol/L (ref 3.5–5.1)
Potassium: 4.7 mmol/L (ref 3.5–5.1)
Potassium: 5 mmol/L (ref 3.5–5.1)
Potassium: 5.2 mmol/L — ABNORMAL HIGH (ref 3.5–5.1)
Potassium: 3.6 mmol/L (ref 3.5–5.1)
Potassium: 4.5 mmol/L (ref 3.5–5.1)
Potassium: 4.1 mmol/L (ref 3.5–5.1)
Potassium: 4 mmol/L (ref 3.5–5.1)
Potassium: 4.3 mmol/L (ref 3.5–5.1)
Sodium: 139 mmol/L (ref 135–145)
Sodium: 139 mmol/L (ref 135–145)
Sodium: 138 mmol/L (ref 135–145)
Sodium: 139 mmol/L (ref 135–145)
Sodium: 139 mmol/L (ref 135–145)
Sodium: 137 mmol/L (ref 135–145)
Sodium: 140 mmol/L (ref 135–145)
Sodium: 140 mmol/L (ref 135–145)
Sodium: 139 mmol/L (ref 135–145)
Sodium: 137 mmol/L (ref 135–145)
TCO2: 25 mmol/L (ref 22–32)
TCO2: 26 mmol/L (ref 22–32)
TCO2: 25 mmol/L (ref 22–32)
TCO2: 25 mmol/L (ref 22–32)
TCO2: 26 mmol/L (ref 22–32)
TCO2: 21 mmol/L — ABNORMAL LOW (ref 22–32)
TCO2: 23 mmol/L (ref 22–32)
TCO2: 21 mmol/L — ABNORMAL LOW (ref 22–32)
TCO2: 20 mmol/L — ABNORMAL LOW (ref 22–32)
TCO2: 22 mmol/L (ref 22–32)
pCO2 arterial: 34.4 mmHg (ref 32.0–48.0)
pCO2 arterial: 35.6 mmHg (ref 32.0–48.0)
pCO2 arterial: 35.3 mmHg (ref 32.0–48.0)
pCO2 arterial: 36.5 mmHg (ref 32.0–48.0)
pCO2 arterial: 40 mmHg (ref 32.0–48.0)
pCO2 arterial: 38.3 mmHg (ref 32.0–48.0)
pCO2 arterial: 41.2 mmHg (ref 32.0–48.0)
pCO2 arterial: 36.4 mmHg (ref 32.0–48.0)
pCO2 arterial: 37.3 mmHg (ref 32.0–48.0)
pCO2 arterial: 36.2 mmHg (ref 32.0–48.0)
pH, Arterial: 7.444 (ref 7.350–7.450)
pH, Arterial: 7.452 — ABNORMAL HIGH (ref 7.350–7.450)
pH, Arterial: 7.432 (ref 7.350–7.450)
pH, Arterial: 7.447 (ref 7.350–7.450)
pH, Arterial: 7.4 (ref 7.350–7.450)
pH, Arterial: 7.322 — ABNORMAL LOW (ref 7.350–7.450)
pH, Arterial: 7.334 — ABNORMAL LOW (ref 7.350–7.450)
pH, Arterial: 7.352 (ref 7.350–7.450)
pH, Arterial: 7.318 — ABNORMAL LOW (ref 7.350–7.450)
pH, Arterial: 7.364 (ref 7.350–7.450)
pO2, Arterial: 363 mmHg — ABNORMAL HIGH (ref 83.0–108.0)
pO2, Arterial: 408 mmHg — ABNORMAL HIGH (ref 83.0–108.0)
pO2, Arterial: 352 mmHg — ABNORMAL HIGH (ref 83.0–108.0)
pO2, Arterial: 364 mmHg — ABNORMAL HIGH (ref 83.0–108.0)
pO2, Arterial: 333 mmHg — ABNORMAL HIGH (ref 83.0–108.0)
pO2, Arterial: 179 mmHg — ABNORMAL HIGH (ref 83.0–108.0)
pO2, Arterial: 271 mmHg — ABNORMAL HIGH (ref 83.0–108.0)
pO2, Arterial: 157 mmHg — ABNORMAL HIGH (ref 83.0–108.0)
pO2, Arterial: 110 mmHg — ABNORMAL HIGH (ref 83.0–108.0)
pO2, Arterial: 76 mmHg — ABNORMAL LOW (ref 83.0–108.0)

## 2021-03-01 LAB — POCT I-STAT, CHEM 8
BUN: 16 mg/dL (ref 8–23)
BUN: 15 mg/dL (ref 8–23)
BUN: 14 mg/dL (ref 8–23)
BUN: 14 mg/dL (ref 8–23)
BUN: 13 mg/dL (ref 8–23)
Calcium, Ion: 1.24 mmol/L (ref 1.15–1.40)
Calcium, Ion: 1.2 mmol/L (ref 1.15–1.40)
Calcium, Ion: 1.03 mmol/L — ABNORMAL LOW (ref 1.15–1.40)
Calcium, Ion: 1.04 mmol/L — ABNORMAL LOW (ref 1.15–1.40)
Calcium, Ion: 1.16 mmol/L (ref 1.15–1.40)
Chloride: 107 mmol/L (ref 98–111)
Chloride: 108 mmol/L (ref 98–111)
Chloride: 104 mmol/L (ref 98–111)
Chloride: 106 mmol/L (ref 98–111)
Chloride: 106 mmol/L (ref 98–111)
Creatinine, Ser: 1 mg/dL (ref 0.61–1.24)
Creatinine, Ser: 1 mg/dL (ref 0.61–1.24)
Creatinine, Ser: 0.8 mg/dL (ref 0.61–1.24)
Creatinine, Ser: 0.8 mg/dL (ref 0.61–1.24)
Creatinine, Ser: 0.8 mg/dL (ref 0.61–1.24)
Glucose, Bld: 131 mg/dL — ABNORMAL HIGH (ref 70–99)
Glucose, Bld: 145 mg/dL — ABNORMAL HIGH (ref 70–99)
Glucose, Bld: 115 mg/dL — ABNORMAL HIGH (ref 70–99)
Glucose, Bld: 120 mg/dL — ABNORMAL HIGH (ref 70–99)
Glucose, Bld: 179 mg/dL — ABNORMAL HIGH (ref 70–99)
HCT: 37 % — ABNORMAL LOW (ref 39.0–52.0)
HCT: 31 % — ABNORMAL LOW (ref 39.0–52.0)
HCT: 24 % — ABNORMAL LOW (ref 39.0–52.0)
HCT: 26 % — ABNORMAL LOW (ref 39.0–52.0)
HCT: 28 % — ABNORMAL LOW (ref 39.0–52.0)
Hemoglobin: 12.6 g/dL — ABNORMAL LOW (ref 13.0–17.0)
Hemoglobin: 10.5 g/dL — ABNORMAL LOW (ref 13.0–17.0)
Hemoglobin: 8.2 g/dL — ABNORMAL LOW (ref 13.0–17.0)
Hemoglobin: 8.8 g/dL — ABNORMAL LOW (ref 13.0–17.0)
Hemoglobin: 9.5 g/dL — ABNORMAL LOW (ref 13.0–17.0)
Potassium: 4.3 mmol/L (ref 3.5–5.1)
Potassium: 4.2 mmol/L (ref 3.5–5.1)
Potassium: 4.7 mmol/L (ref 3.5–5.1)
Potassium: 4.9 mmol/L (ref 3.5–5.1)
Potassium: 4.5 mmol/L (ref 3.5–5.1)
Sodium: 140 mmol/L (ref 135–145)
Sodium: 138 mmol/L (ref 135–145)
Sodium: 137 mmol/L (ref 135–145)
Sodium: 138 mmol/L (ref 135–145)
Sodium: 137 mmol/L (ref 135–145)
TCO2: 24 mmol/L (ref 22–32)
TCO2: 22 mmol/L (ref 22–32)
TCO2: 24 mmol/L (ref 22–32)
TCO2: 26 mmol/L (ref 22–32)
TCO2: 22 mmol/L (ref 22–32)

## 2021-03-01 LAB — CBC
HCT: 35.4 % — ABNORMAL LOW (ref 39.0–52.0)
HCT: 29.7 % — ABNORMAL LOW (ref 39.0–52.0)
Hemoglobin: 11.3 g/dL — ABNORMAL LOW (ref 13.0–17.0)
Hemoglobin: 9.3 g/dL — ABNORMAL LOW (ref 13.0–17.0)
MCH: 25.1 pg — ABNORMAL LOW (ref 26.0–34.0)
MCH: 24.6 pg — ABNORMAL LOW (ref 26.0–34.0)
MCHC: 31.9 g/dL (ref 30.0–36.0)
MCHC: 31.3 g/dL (ref 30.0–36.0)
MCV: 78.7 fL — ABNORMAL LOW (ref 80.0–100.0)
MCV: 78.6 fL — ABNORMAL LOW (ref 80.0–100.0)
Platelets: 210 10*3/uL (ref 150–400)
Platelets: 149 10*3/uL — ABNORMAL LOW (ref 150–400)
RBC: 4.5 MIL/uL (ref 4.22–5.81)
RBC: 3.78 MIL/uL — ABNORMAL LOW (ref 4.22–5.81)
RDW: 16.9 % — ABNORMAL HIGH (ref 11.5–15.5)
RDW: 16.3 % — ABNORMAL HIGH (ref 11.5–15.5)
WBC: 21.9 10*3/uL — ABNORMAL HIGH (ref 4.0–10.5)
WBC: 14.4 10*3/uL — ABNORMAL HIGH (ref 4.0–10.5)
nRBC: 0 % (ref 0.0–0.2)
nRBC: 0 % (ref 0.0–0.2)

## 2021-03-01 LAB — POCT I-STAT EG7
Acid-base deficit: 2 mmol/L (ref 0.0–2.0)
Bicarbonate: 22.4 mmol/L (ref 20.0–28.0)
Calcium, Ion: 1.03 mmol/L — ABNORMAL LOW (ref 1.15–1.40)
HCT: 27 % — ABNORMAL LOW (ref 39.0–52.0)
Hemoglobin: 9.2 g/dL — ABNORMAL LOW (ref 13.0–17.0)
O2 Saturation: 80 %
Potassium: 4.2 mmol/L (ref 3.5–5.1)
Sodium: 140 mmol/L (ref 135–145)
TCO2: 23 mmol/L (ref 22–32)
pCO2, Ven: 34.4 mmHg — ABNORMAL LOW (ref 44.0–60.0)
pH, Ven: 7.42 (ref 7.250–7.430)
pO2, Ven: 43 mmHg (ref 32.0–45.0)

## 2021-03-01 LAB — GLUCOSE, CAPILLARY
Glucose-Capillary: 127 mg/dL — ABNORMAL HIGH (ref 70–99)
Glucose-Capillary: 140 mg/dL — ABNORMAL HIGH (ref 70–99)
Glucose-Capillary: 158 mg/dL — ABNORMAL HIGH (ref 70–99)
Glucose-Capillary: 125 mg/dL — ABNORMAL HIGH (ref 70–99)
Glucose-Capillary: 113 mg/dL — ABNORMAL HIGH (ref 70–99)
Glucose-Capillary: 121 mg/dL — ABNORMAL HIGH (ref 70–99)
Glucose-Capillary: 172 mg/dL — ABNORMAL HIGH (ref 70–99)
Glucose-Capillary: 143 mg/dL — ABNORMAL HIGH (ref 70–99)
Glucose-Capillary: 159 mg/dL — ABNORMAL HIGH (ref 70–99)
Glucose-Capillary: 142 mg/dL — ABNORMAL HIGH (ref 70–99)

## 2021-03-01 LAB — BASIC METABOLIC PANEL
Anion gap: 6 (ref 5–15)
BUN: 11 mg/dL (ref 8–23)
CO2: 20 mmol/L — ABNORMAL LOW (ref 22–32)
Calcium: 8 mg/dL — ABNORMAL LOW (ref 8.9–10.3)
Chloride: 107 mmol/L (ref 98–111)
Creatinine, Ser: 0.87 mg/dL (ref 0.61–1.24)
GFR, Estimated: >60 mL/min (ref >60)
Glucose, Bld: 178 mg/dL — ABNORMAL HIGH (ref 70–99)
Potassium: 4.6 mmol/L (ref 3.5–5.1)
Sodium: 133 mmol/L — ABNORMAL LOW (ref 135–145)

## 2021-03-01 LAB — MAGNESIUM: Magnesium: 3.3 mg/dL — ABNORMAL HIGH (ref 1.7–2.4)

## 2021-03-01 LAB — APTT: aPTT: 38 seconds — ABNORMAL HIGH (ref 24–36)

## 2021-03-01 LAB — HEMOGLOBIN AND HEMATOCRIT, BLOOD
HCT: 24.5 % — ABNORMAL LOW (ref 39.0–52.0)
Hemoglobin: 7.7 g/dL — ABNORMAL LOW (ref 13.0–17.0)

## 2021-03-01 LAB — PREPARE RBC (CROSSMATCH)

## 2021-03-01 LAB — PROTIME-INR
INR: 1.4 — ABNORMAL HIGH (ref 0.8–1.2)
Prothrombin Time: 17 seconds — ABNORMAL HIGH (ref 11.4–15.2)

## 2021-03-01 LAB — PLATELET COUNT: Platelets: 239 10*3/uL (ref 150–400)

## 2021-03-01 LAB — ABO/RH: ABO/RH(D): A POS

## 2021-03-01 SURGERY — CORONARY ARTERY BYPASS GRAFTING (CABG)
Anesthesia: General | Site: Chest | Laterality: Right

## 2021-03-01 MED ORDER — ROCURONIUM BROMIDE 10 MG/ML (PF) SYRINGE
PREFILLED_SYRINGE | INTRAVENOUS | Status: DC | PRN
  Administered 2021-03-01: 20 mg via INTRAVENOUS
  Administered 2021-03-01: 30 mg via INTRAVENOUS
  Administered 2021-03-01: 40 mg via INTRAVENOUS
  Administered 2021-03-01: 80 mg via INTRAVENOUS

## 2021-03-01 MED ORDER — SODIUM CHLORIDE 0.9% FLUSH: 3.0000 mL | INTRAVENOUS | Status: DC | PRN

## 2021-03-01 MED ORDER — SODIUM CHLORIDE 0.9% FLUSH
3.0000 mL | Freq: Two times a day (BID) | INTRAVENOUS | Status: DC
  Administered 2021-03-02 – 2021-03-08 (×11): 3 mL via INTRAVENOUS

## 2021-03-01 MED ORDER — HEPARIN SODIUM (PORCINE) 1000 UNIT/ML IJ SOLN
INTRAMUSCULAR | Status: DC | PRN
  Administered 2021-03-01: 25000 [IU] via INTRAVENOUS

## 2021-03-01 MED ORDER — PHENYLEPHRINE 40 MCG/ML (10ML) SYRINGE FOR IV PUSH (FOR BLOOD PRESSURE SUPPORT)
PREFILLED_SYRINGE | INTRAVENOUS | Status: AC
  Filled 2021-03-01: qty 10

## 2021-03-01 MED ORDER — CEFAZOLIN SODIUM-DEXTROSE 2-4 GM/100ML-% IV SOLN
2.0000 g | Freq: Three times a day (TID) | INTRAVENOUS | Status: AC
  Administered 2021-03-01 – 2021-03-03 (×6): 2 g via INTRAVENOUS
  Filled 2021-03-01 (×6): qty 100

## 2021-03-01 MED ORDER — ALBUMIN HUMAN 5 % IV SOLN
250.0000 mL | INTRAVENOUS | Status: AC | PRN
  Administered 2021-03-01 (×4): 12.5 g via INTRAVENOUS
  Filled 2021-03-01: qty 250

## 2021-03-01 MED ORDER — LACTATED RINGERS IV SOLN: INTRAVENOUS | Status: DC | PRN

## 2021-03-01 MED ORDER — DOCUSATE SODIUM 100 MG PO CAPS
200.0000 mg | ORAL_CAPSULE | Freq: Every day | ORAL | Status: DC
  Administered 2021-03-02 – 2021-03-06 (×4): 200 mg via ORAL
  Filled 2021-03-01 (×4): qty 2

## 2021-03-01 MED ORDER — DEXMEDETOMIDINE HCL IN NACL 400 MCG/100ML IV SOLN
0.0000 ug/kg/h | INTRAVENOUS | Status: DC
  Filled 2021-03-01: qty 100

## 2021-03-01 MED ORDER — TRAMADOL HCL 50 MG PO TABS
50.0000 mg | ORAL_TABLET | ORAL | Status: DC | PRN
  Administered 2021-03-02: 16:00:00 100 mg via ORAL
  Administered 2021-03-02: 50 mg via ORAL
  Administered 2021-03-02: 100 mg via ORAL
  Administered 2021-03-02 – 2021-03-08 (×4): 50 mg via ORAL
  Filled 2021-03-01 (×3): qty 1
  Filled 2021-03-01 (×2): qty 2
  Filled 2021-03-01 (×3): qty 1

## 2021-03-01 MED ORDER — LACTATED RINGERS IV SOLN: INTRAVENOUS | Status: DC

## 2021-03-01 MED ORDER — SODIUM CHLORIDE 0.9% FLUSH
10.0000 mL | Freq: Two times a day (BID) | INTRAVENOUS | Status: DC
  Administered 2021-03-01 – 2021-03-02 (×2): 30 mL
  Administered 2021-03-02 – 2021-03-04 (×4): 10 mL
  Administered 2021-03-05: 40 mL
  Administered 2021-03-05 – 2021-03-07 (×5): 10 mL
  Administered 2021-03-08: 20 mL

## 2021-03-01 MED ORDER — CHLORHEXIDINE GLUCONATE 0.12% ORAL RINSE (MEDLINE KIT)
15.0000 mL | Freq: Two times a day (BID) | OROMUCOSAL | Status: DC
  Administered 2021-03-01 – 2021-03-11 (×16): 15 mL via OROMUCOSAL

## 2021-03-01 MED ORDER — ROCURONIUM BROMIDE 10 MG/ML (PF) SYRINGE
PREFILLED_SYRINGE | INTRAVENOUS | Status: AC
  Filled 2021-03-01: qty 20

## 2021-03-01 MED ORDER — ONDANSETRON HCL 4 MG/2ML IJ SOLN: 4.0000 mg | Freq: Four times a day (QID) | INTRAMUSCULAR | Status: DC | PRN

## 2021-03-01 MED ORDER — ACETAMINOPHEN 160 MG/5ML PO SOLN: 650.0000 mg | Freq: Once | ORAL | Status: AC

## 2021-03-01 MED ORDER — PHENYLEPHRINE HCL-NACL 20-0.9 MG/250ML-% IV SOLN
0.0000 ug/min | INTRAVENOUS | Status: DC
  Administered 2021-03-01: 40 ug/min via INTRAVENOUS
  Administered 2021-03-02: 20 ug/min via INTRAVENOUS
  Administered 2021-03-02: 20:00:00 60 ug/min via INTRAVENOUS
  Administered 2021-03-03: 20 ug/min via INTRAVENOUS
  Administered 2021-03-03: 90 ug/min via INTRAVENOUS
  Administered 2021-03-03: 60 ug/min via INTRAVENOUS
  Administered 2021-03-05: 20 ug/min via INTRAVENOUS
  Filled 2021-03-01 (×6): qty 250

## 2021-03-01 MED ORDER — HEMOSTATIC AGENTS (NO CHARGE) OPTIME
TOPICAL | Status: DC | PRN
  Administered 2021-03-01: 1 via TOPICAL

## 2021-03-01 MED ORDER — HEPARIN SODIUM (PORCINE) 1000 UNIT/ML IJ SOLN
INTRAMUSCULAR | Status: AC
  Filled 2021-03-01: qty 1

## 2021-03-01 MED ORDER — METOCLOPRAMIDE HCL 5 MG/ML IJ SOLN
10.0000 mg | Freq: Four times a day (QID) | INTRAMUSCULAR | Status: AC
  Administered 2021-03-01 – 2021-03-02 (×3): 10 mg via INTRAVENOUS
  Filled 2021-03-01 (×3): qty 2

## 2021-03-01 MED ORDER — BISACODYL 10 MG RE SUPP: 10.0000 mg | Freq: Every day | RECTAL | Status: DC

## 2021-03-01 MED ORDER — ~~LOC~~ CARDIAC SURGERY, PATIENT & FAMILY EDUCATION
Freq: Once | Status: DC
  Filled 2021-03-01: qty 1

## 2021-03-01 MED ORDER — PHENYLEPHRINE 40 MCG/ML (10ML) SYRINGE FOR IV PUSH (FOR BLOOD PRESSURE SUPPORT)
PREFILLED_SYRINGE | INTRAVENOUS | Status: DC | PRN
  Administered 2021-03-01 (×2): 80 ug via INTRAVENOUS
  Administered 2021-03-01: 40 ug via INTRAVENOUS

## 2021-03-01 MED ORDER — LIDOCAINE 2% (20 MG/ML) 5 ML SYRINGE
INTRAMUSCULAR | Status: DC | PRN
  Administered 2021-03-01: 70 mg via INTRAVENOUS

## 2021-03-01 MED ORDER — BISACODYL 5 MG PO TBEC
10.0000 mg | DELAYED_RELEASE_TABLET | Freq: Every day | ORAL | Status: DC
  Administered 2021-03-02 – 2021-03-06 (×4): 10 mg via ORAL
  Filled 2021-03-01 (×4): qty 2

## 2021-03-01 MED ORDER — MIDAZOLAM HCL 5 MG/5ML IJ SOLN
INTRAMUSCULAR | Status: DC | PRN
  Administered 2021-03-01 (×5): 1 mg via INTRAVENOUS

## 2021-03-01 MED ORDER — MAGNESIUM SULFATE 4 GM/100ML IV SOLN
4.0000 g | Freq: Once | INTRAVENOUS | Status: AC
  Administered 2021-03-01: 4 g via INTRAVENOUS
  Filled 2021-03-01: qty 100

## 2021-03-01 MED ORDER — METOPROLOL TARTRATE 5 MG/5ML IV SOLN
2.5000 mg | INTRAVENOUS | Status: DC | PRN
  Administered 2021-03-07: 18:00:00 5 mg via INTRAVENOUS
  Filled 2021-03-01: qty 5

## 2021-03-01 MED ORDER — ALBUMIN HUMAN 5 % IV SOLN: INTRAVENOUS | Status: DC | PRN

## 2021-03-01 MED ORDER — PROPOFOL 10 MG/ML IV BOLUS
INTRAVENOUS | Status: AC
  Filled 2021-03-01: qty 20

## 2021-03-01 MED ORDER — 0.9 % SODIUM CHLORIDE (POUR BTL) OPTIME
TOPICAL | Status: DC | PRN
  Administered 2021-03-01: 1000 mL
  Administered 2021-03-01: 5000 mL
  Administered 2021-03-01: 1000 mL

## 2021-03-01 MED ORDER — MUPIROCIN 2 % EX OINT
1.0000 "application " | TOPICAL_OINTMENT | Freq: Two times a day (BID) | CUTANEOUS | Status: AC
  Administered 2021-03-01 – 2021-03-06 (×10): 1 via NASAL
  Filled 2021-03-01: qty 22

## 2021-03-01 MED ORDER — ORAL CARE MOUTH RINSE
15.0000 mL | OROMUCOSAL | Status: DC
  Administered 2021-03-01 – 2021-03-05 (×19): 15 mL via OROMUCOSAL

## 2021-03-01 MED ORDER — FENTANYL CITRATE (PF) 250 MCG/5ML IJ SOLN
INTRAMUSCULAR | Status: AC
  Filled 2021-03-01: qty 5

## 2021-03-01 MED ORDER — CALCIUM CARBONATE ANTACID 500 MG PO CHEW
500.0000 mg | CHEWABLE_TABLET | Freq: Every day | ORAL | Status: DC | PRN
Start: 2021-03-01 — End: 2021-03-12

## 2021-03-01 MED ORDER — LIDOCAINE 2% (20 MG/ML) 5 ML SYRINGE
INTRAMUSCULAR | Status: AC
  Filled 2021-03-01: qty 5

## 2021-03-01 MED ORDER — INSULIN REGULAR(HUMAN) IN NACL 100-0.9 UT/100ML-% IV SOLN: INTRAVENOUS | Status: DC

## 2021-03-01 MED ORDER — METOPROLOL TARTRATE 25 MG/10 ML ORAL SUSPENSION: 12.5000 mg | Freq: Two times a day (BID) | ORAL | Status: DC

## 2021-03-01 MED ORDER — SODIUM CHLORIDE 0.9 % IV SOLN: 250.0000 mL | INTRAVENOUS | Status: DC

## 2021-03-01 MED ORDER — CHLORHEXIDINE GLUCONATE CLOTH 2 % EX PADS
6.0000 | MEDICATED_PAD | Freq: Every day | CUTANEOUS | Status: DC
  Administered 2021-03-01 – 2021-03-08 (×8): 6 via TOPICAL

## 2021-03-01 MED ORDER — NITROGLYCERIN IN D5W 200-5 MCG/ML-% IV SOLN: 0.0000 ug/min | INTRAVENOUS | Status: DC

## 2021-03-01 MED ORDER — AMIODARONE HCL IN DEXTROSE 360-4.14 MG/200ML-% IV SOLN
60.0000 mg/h | INTRAVENOUS | Status: AC
  Administered 2021-03-01: 60 mg/h via INTRAVENOUS
  Filled 2021-03-01: qty 200

## 2021-03-01 MED ORDER — VASOPRESSIN 20 UNIT/ML IV SOLN
INTRAVENOUS | Status: AC
  Filled 2021-03-01: qty 1

## 2021-03-01 MED ORDER — VANCOMYCIN HCL IN DEXTROSE 1-5 GM/200ML-% IV SOLN
1000.0000 mg | Freq: Once | INTRAVENOUS | Status: AC
  Administered 2021-03-01: 1000 mg via INTRAVENOUS
  Filled 2021-03-01: qty 200

## 2021-03-01 MED ORDER — ASPIRIN 81 MG PO CHEW: 324.0000 mg | CHEWABLE_TABLET | Freq: Every day | ORAL | Status: DC

## 2021-03-01 MED ORDER — AMIODARONE HCL IN DEXTROSE 360-4.14 MG/200ML-% IV SOLN
INTRAVENOUS | Status: DC | PRN
  Administered 2021-03-01: 60 mg/h via INTRAVENOUS

## 2021-03-01 MED ORDER — SODIUM CHLORIDE (PF) 0.9 % IJ SOLN
OROMUCOSAL | Status: DC | PRN
  Administered 2021-03-01: 4 mL via TOPICAL

## 2021-03-01 MED ORDER — ATORVASTATIN CALCIUM 10 MG PO TABS
20.0000 mg | ORAL_TABLET | Freq: Every day | ORAL | Status: DC
  Administered 2021-03-02 – 2021-03-03 (×2): 20 mg via ORAL
  Filled 2021-03-01 (×2): qty 2

## 2021-03-01 MED ORDER — EPINEPHRINE 1 MG/10ML IJ SOSY
PREFILLED_SYRINGE | INTRAMUSCULAR | Status: AC
  Filled 2021-03-01: qty 10

## 2021-03-01 MED ORDER — EPHEDRINE SULFATE-NACL 50-0.9 MG/10ML-% IV SOSY
PREFILLED_SYRINGE | INTRAVENOUS | Status: DC | PRN
  Administered 2021-03-01: 2.5 mg via INTRAVENOUS
  Administered 2021-03-01: 5 mg via INTRAVENOUS
  Administered 2021-03-01: 2.5 mg via INTRAVENOUS

## 2021-03-01 MED ORDER — FENTANYL CITRATE (PF) 250 MCG/5ML IJ SOLN
INTRAMUSCULAR | Status: DC | PRN
  Administered 2021-03-01: 50 ug via INTRAVENOUS
  Administered 2021-03-01: 150 ug via INTRAVENOUS
  Administered 2021-03-01: 50 ug via INTRAVENOUS
  Administered 2021-03-01 (×4): 100 ug via INTRAVENOUS
  Administered 2021-03-01: 200 ug via INTRAVENOUS
  Administered 2021-03-01: 100 ug via INTRAVENOUS

## 2021-03-01 MED ORDER — METOPROLOL TARTRATE 12.5 MG HALF TABLET: 12.5000 mg | ORAL_TABLET | Freq: Two times a day (BID) | ORAL | Status: DC

## 2021-03-01 MED ORDER — EPHEDRINE 5 MG/ML INJ
INTRAVENOUS | Status: AC
  Filled 2021-03-01: qty 10

## 2021-03-01 MED ORDER — CALCIUM CHLORIDE 10 % IV SOLN
INTRAVENOUS | Status: AC
  Filled 2021-03-01: qty 10

## 2021-03-01 MED ORDER — VASOPRESSIN 20 UNIT/ML IV SOLN
INTRAVENOUS | Status: DC | PRN
  Administered 2021-03-01 (×2): 1 [IU] via INTRAVENOUS
  Administered 2021-03-01: 3 [IU] via INTRAVENOUS
  Administered 2021-03-01 (×3): 1 [IU] via INTRAVENOUS

## 2021-03-01 MED ORDER — POTASSIUM CHLORIDE 10 MEQ/50ML IV SOLN: 10.0000 meq | INTRAVENOUS | Status: AC

## 2021-03-01 MED ORDER — FAMOTIDINE IN NACL 20-0.9 MG/50ML-% IV SOLN
20.0000 mg | Freq: Two times a day (BID) | INTRAVENOUS | Status: AC
  Administered 2021-03-01 (×2): 20 mg via INTRAVENOUS
  Filled 2021-03-01 (×2): qty 50

## 2021-03-01 MED ORDER — DEXTROSE 50 % IV SOLN: 0.0000 mL | INTRAVENOUS | Status: DC | PRN

## 2021-03-01 MED ORDER — MORPHINE SULFATE (PF) 2 MG/ML IV SOLN
1.0000 mg | INTRAVENOUS | Status: DC | PRN
  Administered 2021-03-01: 23:00:00 1 mg via INTRAVENOUS
  Administered 2021-03-01: 2 mg via INTRAVENOUS
  Filled 2021-03-01 (×2): qty 1

## 2021-03-01 MED ORDER — CALCIUM CHLORIDE 10 % IV SOLN
1.0000 g | Freq: Once | INTRAVENOUS | Status: AC
  Administered 2021-03-01: 1 g via INTRAVENOUS

## 2021-03-01 MED ORDER — LACTATED RINGERS IV SOLN
INTRAVENOUS | Status: DC
  Administered 2021-03-02: 11:00:00 20 mL/h via INTRAVENOUS

## 2021-03-01 MED ORDER — SODIUM CHLORIDE 0.45 % IV SOLN: INTRAVENOUS | Status: DC | PRN

## 2021-03-01 MED ORDER — PROTAMINE SULFATE 10 MG/ML IV SOLN
INTRAVENOUS | Status: DC | PRN
  Administered 2021-03-01: 50 mg via INTRAVENOUS
  Administered 2021-03-01: 40 mg via INTRAVENOUS
  Administered 2021-03-01: 20 mg via INTRAVENOUS
  Administered 2021-03-01: 30 mg via INTRAVENOUS
  Administered 2021-03-01: 50 mg via INTRAVENOUS
  Administered 2021-03-01: 60 mg via INTRAVENOUS

## 2021-03-01 MED ORDER — AMIODARONE HCL IN DEXTROSE 360-4.14 MG/200ML-% IV SOLN
30.0000 mg/h | INTRAVENOUS | Status: DC
  Administered 2021-03-01 – 2021-03-02 (×3): 30 mg/h via INTRAVENOUS
  Filled 2021-03-01 (×3): qty 200

## 2021-03-01 MED ORDER — PROTAMINE SULFATE 10 MG/ML IV SOLN
INTRAVENOUS | Status: AC
  Filled 2021-03-01: qty 25

## 2021-03-01 MED ORDER — CHLORHEXIDINE GLUCONATE 0.12 % MT SOLN
15.0000 mL | Freq: Once | OROMUCOSAL | Status: DC
  Filled 2021-03-01: qty 15

## 2021-03-01 MED ORDER — PANTOPRAZOLE SODIUM 40 MG PO TBEC
40.0000 mg | DELAYED_RELEASE_TABLET | Freq: Every day | ORAL | Status: DC
  Administered 2021-03-03 – 2021-03-12 (×10): 40 mg via ORAL
  Filled 2021-03-01 (×10): qty 1

## 2021-03-01 MED ORDER — CALCIUM CHLORIDE 10 % IV SOLN
INTRAVENOUS | Status: DC | PRN
  Administered 2021-03-01 (×3): 200 mg via INTRAVENOUS

## 2021-03-01 MED ORDER — MIDAZOLAM HCL (PF) 10 MG/2ML IJ SOLN
INTRAMUSCULAR | Status: AC
  Filled 2021-03-01: qty 2

## 2021-03-01 MED ORDER — ACETAMINOPHEN 160 MG/5ML PO SOLN
1000.0000 mg | Freq: Four times a day (QID) | ORAL | Status: AC
  Filled 2021-03-01: qty 40.6

## 2021-03-01 MED ORDER — SODIUM CHLORIDE 0.9 % IV SOLN: INTRAVENOUS | Status: DC | PRN

## 2021-03-01 MED ORDER — SODIUM CHLORIDE 0.9% FLUSH: 10.0000 mL | INTRAVENOUS | Status: DC | PRN

## 2021-03-01 MED ORDER — ACETAMINOPHEN 500 MG PO TABS
1000.0000 mg | ORAL_TABLET | Freq: Four times a day (QID) | ORAL | Status: AC
  Administered 2021-03-01 – 2021-03-06 (×19): 1000 mg via ORAL
  Filled 2021-03-01 (×19): qty 2

## 2021-03-01 MED ORDER — PLASMA-LYTE A IV SOLN
INTRAVENOUS | Status: DC | PRN
  Administered 2021-03-01: 1000 mL

## 2021-03-01 MED ORDER — CHLORHEXIDINE GLUCONATE 4 % EX LIQD: 30.0000 mL | CUTANEOUS | Status: DC

## 2021-03-01 MED ORDER — OXYCODONE HCL 5 MG PO TABS
5.0000 mg | ORAL_TABLET | ORAL | Status: DC | PRN
  Administered 2021-03-07 – 2021-03-10 (×8): 10 mg via ORAL
  Administered 2021-03-10: 5 mg via ORAL
  Administered 2021-03-11 (×2): 10 mg via ORAL
  Filled 2021-03-01 (×4): qty 2
  Filled 2021-03-01: qty 1
  Filled 2021-03-01 (×6): qty 2

## 2021-03-01 MED ORDER — METOPROLOL TARTRATE 12.5 MG HALF TABLET: 12.5000 mg | ORAL_TABLET | Freq: Once | ORAL | Status: DC

## 2021-03-01 MED ORDER — ACETAMINOPHEN 650 MG RE SUPP
650.0000 mg | Freq: Once | RECTAL | Status: AC
  Administered 2021-03-01: 650 mg via RECTAL

## 2021-03-01 MED ORDER — EPINEPHRINE 1 MG/10ML IJ SOSY
PREFILLED_SYRINGE | INTRAMUSCULAR | Status: DC | PRN
  Administered 2021-03-01: 100 ug via INTRAVENOUS

## 2021-03-01 MED ORDER — SODIUM CHLORIDE 0.9 % IV SOLN: INTRAVENOUS | Status: DC

## 2021-03-01 MED ORDER — PROPOFOL 10 MG/ML IV BOLUS
INTRAVENOUS | Status: DC | PRN
  Administered 2021-03-01: 10 mg via INTRAVENOUS
  Administered 2021-03-01: 40 mg via INTRAVENOUS

## 2021-03-01 MED ORDER — ASPIRIN EC 325 MG PO TBEC
325.0000 mg | DELAYED_RELEASE_TABLET | Freq: Every day | ORAL | Status: DC
  Administered 2021-03-02 – 2021-03-06 (×5): 325 mg via ORAL
  Filled 2021-03-01 (×5): qty 1

## 2021-03-01 MED ORDER — LACTATED RINGERS IV SOLN: 500.0000 mL | Freq: Once | INTRAVENOUS | Status: DC | PRN

## 2021-03-01 MED ORDER — MIDAZOLAM HCL 2 MG/2ML IJ SOLN: 2.0000 mg | INTRAMUSCULAR | Status: DC | PRN

## 2021-03-01 MED ORDER — CHLORHEXIDINE GLUCONATE 0.12 % MT SOLN
15.0000 mL | OROMUCOSAL | Status: AC
  Administered 2021-03-01: 15 mL via OROMUCOSAL

## 2021-03-01 SURGICAL SUPPLY — 130 items
ADAPTER CARDIO PERF ANTE/RETRO (ADAPTER) ×7 IMPLANT
ADH SKN CLS APL DERMABOND .7 (GAUZE/BANDAGES/DRESSINGS) ×5
ADH SRG 12 PREFL SYR 3 SPRDR (MISCELLANEOUS)
ADPR PRFSN 84XANTGRD RTRGD (ADAPTER) ×5
APL SWBSTK 6 STRL LF DISP (MISCELLANEOUS)
APPLICATOR COTTON TIP 6 STRL (MISCELLANEOUS) IMPLANT
APPLICATOR COTTON TIP 6IN STRL (MISCELLANEOUS)
BAG COUNTER SPONGE SURGICOUNT (BAG) ×2 IMPLANT
BAG DECANTER FOR FLEXI CONT (MISCELLANEOUS) ×7 IMPLANT
BAG SPNG CNTER NS LX DISP (BAG) ×10
BAG SURGICOUNT SPONGE COUNTING (BAG) ×2
BLADE CLIPPER SURG (BLADE) ×7 IMPLANT
BLADE STERNUM SYSTEM 6 (BLADE) ×7 IMPLANT
BLADE SURG 11 STRL SS (BLADE) ×2 IMPLANT
BLADE SURG 15 STRL LF DISP TIS (BLADE) ×5 IMPLANT
BLADE SURG 15 STRL SS (BLADE) ×7
BNDG ELASTIC 4X5.8 VLCR STR LF (GAUZE/BANDAGES/DRESSINGS) ×7 IMPLANT
BNDG ELASTIC 6X5.8 VLCR STR LF (GAUZE/BANDAGES/DRESSINGS) ×7 IMPLANT
BNDG GAUZE ELAST 4 BULKY (GAUZE/BANDAGES/DRESSINGS) ×7 IMPLANT
CANISTER SUCT 3000ML PPV (MISCELLANEOUS) ×7 IMPLANT
CANNULA EZ GLIDE AORTIC 21FR (CANNULA) ×7 IMPLANT
CANNULA GUNDRY RCSP 15FR (MISCELLANEOUS) ×7 IMPLANT
CATH CPB KIT HENDRICKSON (MISCELLANEOUS) ×7 IMPLANT
CATH HEART VENT LEFT (CATHETERS) ×5 IMPLANT
CATH ROBINSON RED A/P 18FR (CATHETERS) ×14 IMPLANT
CATH THORACIC 36FR (CATHETERS) ×7 IMPLANT
CATH THORACIC 36FR RT ANG (CATHETERS) ×14 IMPLANT
CLIP FOGARTY SPRING 6M (CLIP) IMPLANT
CLIP TI WIDE RED SMALL 24 (CLIP) ×4 IMPLANT
CLIP VESOCCLUDE MED 24/CT (CLIP) IMPLANT
CLIP VESOCCLUDE SM WIDE 24/CT (CLIP) IMPLANT
CONTAINER PROTECT SURGISLUSH (MISCELLANEOUS) ×14 IMPLANT
DEFOGGER ANTIFOG KIT (MISCELLANEOUS) ×2 IMPLANT
DERMABOND ADVANCED (GAUZE/BANDAGES/DRESSINGS) ×2
DERMABOND ADVANCED .7 DNX12 (GAUZE/BANDAGES/DRESSINGS) IMPLANT
DEVICE SUT CK QUICK LOAD MINI (Prosthesis & Implant Heart) ×1 IMPLANT
DRAPE CARDIOVASCULAR INCISE (DRAPES) ×7
DRAPE SRG 135X102X78XABS (DRAPES) ×5 IMPLANT
DRAPE WARM FLUID 44X44 (DRAPES) ×7 IMPLANT
DRSG COVADERM 4X14 (GAUZE/BANDAGES/DRESSINGS) ×7 IMPLANT
ELECT REM PT RETURN 9FT ADLT (ELECTROSURGICAL) ×14
ELECTRODE REM PT RTRN 9FT ADLT (ELECTROSURGICAL) ×10 IMPLANT
FELT TEFLON 1X6 (MISCELLANEOUS) ×12 IMPLANT
GAUZE 4X4 16PLY ~~LOC~~+RFID DBL (SPONGE) ×2 IMPLANT
GAUZE SPONGE 4X4 12PLY STRL (GAUZE/BANDAGES/DRESSINGS) ×14 IMPLANT
GAUZE SPONGE 4X4 12PLY STRL LF (GAUZE/BANDAGES/DRESSINGS) ×4 IMPLANT
GLOVE SURG MICRO LTX SZ6 (GLOVE) ×20 IMPLANT
GLOVE SURG MICRO LTX SZ6.5 (GLOVE) ×4 IMPLANT
GLOVE SURG MICRO LTX SZ7.5 (GLOVE) ×6 IMPLANT
GLOVE SURG SIGNA 7.5 PF LTX (GLOVE) ×21 IMPLANT
GOWN STRL REUS W/ TWL LRG LVL3 (GOWN DISPOSABLE) ×20 IMPLANT
GOWN STRL REUS W/ TWL XL LVL3 (GOWN DISPOSABLE) ×10 IMPLANT
GOWN STRL REUS W/TWL LRG LVL3 (GOWN DISPOSABLE) ×70
GOWN STRL REUS W/TWL XL LVL3 (GOWN DISPOSABLE) ×14
HEMOSTAT POWDER SURGIFOAM 1G (HEMOSTASIS) ×21 IMPLANT
HEMOSTAT SURGICEL 2X14 (HEMOSTASIS) ×7 IMPLANT
INSERT FOGARTY XLG (MISCELLANEOUS) IMPLANT
IV CATH 18G X1.75 CATHLON (IV SOLUTION) ×2 IMPLANT
KIT BASIN OR (CUSTOM PROCEDURE TRAY) ×7 IMPLANT
KIT COMBO MINI 4X17COR-KNOT (Prosthesis & Implant Heart) ×1 IMPLANT
KIT SUCTION CATH 14FR (SUCTIONS) ×14 IMPLANT
KIT SUT CK MINI COMBO 4X17 (Prosthesis & Implant Heart) ×1 IMPLANT
KIT TURNOVER KIT B (KITS) ×7 IMPLANT
KIT VASOVIEW HEMOPRO 2 VH 4000 (KITS) ×7 IMPLANT
LINE VENT (MISCELLANEOUS) ×2 IMPLANT
LOAD QUICK .035MM CK MINI (Prosthesis & Implant Heart) ×1 IMPLANT
MARKER GRAFT CORONARY BYPASS (MISCELLANEOUS) ×6 IMPLANT
NS IRRIG 1000ML POUR BTL (IV SOLUTION) ×35 IMPLANT
PACK E OPEN HEART (SUTURE) ×7 IMPLANT
PACK OPEN HEART (CUSTOM PROCEDURE TRAY) ×7 IMPLANT
PAD ARMBOARD 7.5X6 YLW CONV (MISCELLANEOUS) ×14 IMPLANT
PAD ELECT DEFIB RADIOL ZOLL (MISCELLANEOUS) ×7 IMPLANT
PENCIL BUTTON HOLSTER BLD 10FT (ELECTRODE) ×7 IMPLANT
POSITIONER HEAD DONUT 9IN (MISCELLANEOUS) ×7 IMPLANT
PUNCH AORTIC ROTATE 4.0MM (MISCELLANEOUS) IMPLANT
PUNCH AORTIC ROTATE 4.5MM 8IN (MISCELLANEOUS) ×2 IMPLANT
PUNCH AORTIC ROTATE 5MM 8IN (MISCELLANEOUS) IMPLANT
SET MPS 3-ND DEL (MISCELLANEOUS) ×2 IMPLANT
SOL PREP POV-IOD 4OZ 10% (MISCELLANEOUS) ×4 IMPLANT
SOL PREP PROV IODINE SCRUB 4OZ (MISCELLANEOUS) ×4 IMPLANT
SPONGE T-LAP 18X18 ~~LOC~~+RFID (SPONGE) ×8 IMPLANT
SPONGE T-LAP 4X18 ~~LOC~~+RFID (SPONGE) ×2 IMPLANT
SUPPORT HEART JANKE-BARRON (MISCELLANEOUS) ×7 IMPLANT
SUT BONE WAX W31G (SUTURE) ×7 IMPLANT
SUT EB EXC GRN/WHT 2-0 V-5 (SUTURE) ×14 IMPLANT
SUT ETHIBON EXCEL 2-0 V-5 (SUTURE) IMPLANT
SUT ETHIBOND 2 0 SH (SUTURE) ×7
SUT ETHIBOND 2 0 SH 36X2 (SUTURE) ×5 IMPLANT
SUT ETHIBOND 2 0 V4 (SUTURE) IMPLANT
SUT ETHIBOND 2 0 V5 (SUTURE) ×4 IMPLANT
SUT ETHIBOND 2 0V4 GREEN (SUTURE) IMPLANT
SUT ETHIBOND 4 0 RB 1 (SUTURE) IMPLANT
SUT ETHIBOND V-5 VALVE (SUTURE) IMPLANT
SUT MNCRL AB 4-0 PS2 18 (SUTURE) ×2 IMPLANT
SUT PROLENE 3 0 SH 1 (SUTURE) IMPLANT
SUT PROLENE 3 0 SH DA (SUTURE) ×7 IMPLANT
SUT PROLENE 4 0 RB 1 (SUTURE) ×49
SUT PROLENE 4 0 SH DA (SUTURE) ×6 IMPLANT
SUT PROLENE 4-0 RB1 .5 CRCL 36 (SUTURE) ×10 IMPLANT
SUT PROLENE 6 0 C 1 30 (SUTURE) ×16 IMPLANT
SUT PROLENE 7 0 BV1 MDA (SUTURE) ×7 IMPLANT
SUT PROLENE 8 0 BV175 6 (SUTURE) IMPLANT
SUT SILK  1 MH (SUTURE) ×21
SUT SILK 1 MH (SUTURE) ×5 IMPLANT
SUT STEEL 6MS V (SUTURE) ×7 IMPLANT
SUT STEEL STERNAL CCS#1 18IN (SUTURE) IMPLANT
SUT STEEL SZ 6 DBL 3X14 BALL (SUTURE) ×7 IMPLANT
SUT VIC AB 1 CTX 27 (SUTURE) ×2 IMPLANT
SUT VIC AB 1 CTX 36 (SUTURE) ×28
SUT VIC AB 1 CTX36XBRD ANBCTR (SUTURE) ×10 IMPLANT
SUT VIC AB 2-0 CT1 27 (SUTURE) ×7
SUT VIC AB 2-0 CT1 TAPERPNT 27 (SUTURE) IMPLANT
SUT VIC AB 2-0 CTX 27 (SUTURE) IMPLANT
SUT VIC AB 2-0 CTX 36 (SUTURE) ×4 IMPLANT
SUT VIC AB 3-0 SH 27 (SUTURE)
SUT VIC AB 3-0 SH 27X BRD (SUTURE) IMPLANT
SUT VIC AB 3-0 X1 27 (SUTURE) ×4 IMPLANT
SUT VICRYL 4-0 PS2 18IN ABS (SUTURE) IMPLANT
SYR 10ML KIT SKIN ADHESIVE (MISCELLANEOUS) IMPLANT
SYSTEM SAHARA CHEST DRAIN ATS (WOUND CARE) ×7 IMPLANT
TAPE CLOTH SURG 4X10 WHT LF (GAUZE/BANDAGES/DRESSINGS) ×4 IMPLANT
TAPE PAPER 2X10 WHT MICROPORE (GAUZE/BANDAGES/DRESSINGS) ×2 IMPLANT
TOWEL GREEN STERILE (TOWEL DISPOSABLE) ×7 IMPLANT
TOWEL GREEN STERILE FF (TOWEL DISPOSABLE) ×7 IMPLANT
TRAY FOLEY SLVR 16FR TEMP STAT (SET/KITS/TRAYS/PACK) ×7 IMPLANT
TUBING LAP HI FLOW INSUFFLATIO (TUBING) ×7 IMPLANT
UNDERPAD 30X36 HEAVY ABSORB (UNDERPADS AND DIAPERS) ×7 IMPLANT
VALVE AORTIC SZ21 INSP/RESIL (Valve) ×2 IMPLANT
VENT LEFT HEART 12002 (CATHETERS) ×7
WATER STERILE IRR 1000ML POUR (IV SOLUTION) ×14 IMPLANT

## 2021-03-01 NOTE — Procedures (Signed)
Extubation Procedure Note  Patient Details:   Name: Keith Wagner DOB: Aug 27, 1940 MRN: [id number]   Airway Documentation:    Vent end date: 03/01/21 Vent end time: 1838   Evaluation  O2 sats: stable throughout Complications: No apparent complications Patient did tolerate procedure well. Bilateral Breath Sounds: Clear   Yes  Patient tolerated wean process well. Positive cuff leak noted prior to extubation. Patient extubated to 4L [place] with humidity. No stridor noted at this time.   Lynann Bologna 03/01/2021, 6:41 PM

## 2021-03-01 NOTE — Anesthesia Procedure Notes (Signed)
Central Venous Catheter Insertion Performed by: Oleta Mouse, MD, anesthesiologist Start/End12/10/2020 7:16 AM, 03/01/2021 7:26 AM Patient location: Pre-op. Preanesthetic checklist: patient identified, IV checked, site marked, risks and benefits discussed, surgical consent, monitors and equipment checked, pre-op evaluation, timeout performed and anesthesia consent Hand hygiene performed  and maximum sterile barriers used  PA cath was placed.Swan type:thermodilution Procedure performed without using ultrasound guided technique. Attempts: 1 Patient tolerated the procedure well with no immediate complications.

## 2021-03-01 NOTE — Anesthesia Procedure Notes (Signed)
Procedure Name: Intubation Date/Time: 03/01/2021 8:05 AM Performed by: Betha Loa, CRNA Pre-anesthesia Checklist: Patient identified, Emergency Drugs available, Suction available and Patient being monitored Patient Re-evaluated:Patient Re-evaluated prior to induction Oxygen Delivery Method: Circle System Utilized Preoxygenation: Pre-oxygenation with 100% oxygen Induction Type: IV induction Ventilation: Mask ventilation without difficulty and Oral airway inserted - appropriate to patient size Laryngoscope Size: Mac and 4 Grade View: Grade I Tube type: Oral Tube size: 8.0 mm Number of attempts: 1 Airway Equipment and Method: Stylet and Oral airway Placement Confirmation: ETT inserted through vocal cords under direct vision, positive ETCO2 and breath sounds checked- equal and bilateral Secured at: 22 cm Tube secured with: Tape Dental Injury: Teeth and Oropharynx as per pre-operative assessment

## 2021-03-01 NOTE — Anesthesia Procedure Notes (Signed)
Central Venous Catheter Insertion Performed by: Oleta Mouse, MD, anesthesiologist Start/End12/10/2020 7:16 AM, 03/01/2021 7:26 AM Patient location: Pre-op. Preanesthetic checklist: patient identified, IV checked, site marked, risks and benefits discussed, surgical consent, monitors and equipment checked, pre-op evaluation, timeout performed and anesthesia consent Position: supine Lidocaine 1% used for infiltration and patient sedated Hand hygiene performed  and maximum sterile barriers used  Catheter size: 9 Fr Total catheter length 10. Central line was placed.MAC introducer Procedure performed using ultrasound guided technique. Ultrasound Notes:anatomy identified, needle tip was noted to be adjacent to the nerve/plexus identified, no ultrasound evidence of intravascular and/or intraneural injection and image(s) printed for medical record Attempts: 1 Following insertion, line sutured, dressing applied and Biopatch. Post procedure assessment: blood return through all ports, free fluid flow and no air  Patient tolerated the procedure well with no immediate complications.

## 2021-03-01 NOTE — Progress Notes (Signed)
  Echocardiogram Echocardiogram Transesophageal has been performed.  Keith Wagner 03/01/2021, 10:37 AM

## 2021-03-01 NOTE — Transfer of Care (Signed)
Immediate Anesthesia Transfer of Care Note  Patient: Keith Wagner  Procedure(s) Performed: CORONARY ARTERY BYPASS GRAFTING (CABG) X 2 USING LEFT INTERNAL MAMMARY ARTERY AND RIGHT ENDOSCOPIC GREATER SAPHENOUS VEIN CONDUITS (Chest) AORTIC VALVE REPLACEMENT (AVR) WITH INSPIRIS RESILIA  AORTIC VALVE SIZE 21MM (Chest) TRANSESOPHAGEAL ECHOCARDIOGRAM (TEE) APPLICATION OF CELL SAVER ENDOVEIN HARVEST OF GREATER SAPHENOUS VEIN (Right) CHEST EXPLORATION (Chest)  Patient Location: SICU  Anesthesia Type:General  Level of Consciousness: sedated and Patient remains intubated per anesthesia plan  Airway & Oxygen Therapy: Patient remains intubated per anesthesia plan and Patient placed on Ventilator (see vital sign flow sheet for setting)  Post-op Assessment: Report given to RN and Post -op Vital signs reviewed and stable  Post vital signs: Reviewed and stable  Last Vitals:  Vitals Value Taken Time  BP 100/8 03/01/21 1433  Temp 36.2 C 03/01/21 1442  Pulse 89 03/01/21 1442  Resp 12 03/01/21 1442  SpO2 100 % 03/01/21 1442  Vitals shown include unvalidated device data.  Last Pain:  Vitals:   03/01/21 0633  TempSrc:   PainSc: 0-No pain      Patients Stated Pain Goal: 0 (51/89/84 2103)  Complications: No notable events documented.

## 2021-03-01 NOTE — Plan of Care (Signed)
  Problem: Cardiac: Goal: Will achieve and/or maintain hemodynamic stability Outcome: Progressing   Problem: Clinical Measurements: Goal: Postoperative complications will be avoided or minimized Outcome: Progressing   Problem: Respiratory: Goal: Respiratory status will improve Outcome: Progressing   Problem: Urinary Elimination: Goal: Ability to achieve and maintain adequate renal perfusion and functioning will improve Outcome: Progressing   Problem: Clinical Measurements: Goal: Will remain free from infection Outcome: Progressing Goal: Diagnostic test results will improve Outcome: Progressing Goal: Respiratory complications will improve Outcome: Progressing Goal: Cardiovascular complication will be avoided Outcome: Progressing

## 2021-03-01 NOTE — Hospital Course (Addendum)
History of Present Illness:  Keith Wagner is a 80 year old man with a history of hypertension, hyperlipidemia, type 2 diabetes, asthma, gout, and aortic stenosis.  He also has a history of NYHA class III congestive heart failure, currently well compensated.  Back in August and September he was having shortness of breath and congestion.  Chest x-ray showed signs of pulmonary edema and his BNP was elevated.  Improved with diuresis.  His wife says he is no longer complaining of shortness of breath but that he has cut his activities back and he is exhausted all the time.  He had an echocardiogram which showed an ejection fraction of 30 to 35%.  Aortic valve gradients were only mild but his calculated valve area was 0.9 cm.  Reviewing the images the valve is heavily calcified.  He underwent cardiac catheterization which showed left main and two-vessel coronary disease.  It was felt coronary bypass grafting would be indicated with possible Aortic Valve Replacement.  He was evaluated by Dr. Roxan Hockey who was in agreement the patient the would benefit from surgical intervention.  He would require dental evaluation prior to proceeding with surgery.  He was agreeable to get this done.  The risks and benefits of the procedure were explained to the patient and she was agreeable to proceed.  Surgery was scheduled for 03/01/2021.  Hospital course:  Keith Wagner presented to [facility] on 03/01/2021.  The patient was taken to the operating room and underwent CABG x 2 utilizing LIMA to LAD, SVG to OM and Aortic Valve Replacement with a 21 mm Edwards Inspiris Resilia Bioprosthetic valve.  He tolerated the procedure without difficulty and was taken to the SICU in stable condition.  He was extubated overnight.  He was weaned off Neo-synephrine as hemodynamics allowed.  His post operative EKG showed NSR with PACs.  His heart rate was low and he was temporarily paced.  He was felt to be intravascularly dry and treated  with Albumin.  Once this improved lasix was initiated.  His chest tubes and arterial lines were removed without difficulty.  By the evening of the first postoperative day, he had developed complete heart block with slow escape rhythm in the 30s.  Pacing by way of the epicardial pacing lead was intermittent.  Cardiology was consulted.  Patient was seen by Dr. Christen Butter and decision was made to proceed with placement of a transvenous temporary pacing lead for hemodynamic stabilization.  This procedure was successfully carried out on 03/03/2021.  An echocardiogram was also performed that showed no pericardial effusion.  LV function was good with no significant wall motion abnormalities.  The valve was functioning appropriately with no perivalvular leak.  THe patient developed Atrial Fibrillation and was treated with Amiodarone bolus and drip per protocol. He converted to NSR, but unfortunately he experienced additional episodes which required Amiodarone bolus for treatment.  He developed AKI with decreased U/O and was started on Dopamine.  He responded well to this with improvement in his creatinine level and good u/o response with diuresis.  He was weaned off Dopamine on 03/06/2021.  The patient required HFNC which was weaned as tolerated.  His temporary pacer was removed on 03/06/2021.  He was started on Lopressor for additional HR control and Cozaar this his CHF.  He was started on Heparin for Atrial Fibrillation.  His temporary pacing wires were removed without difficulty.  He will be started on Eliquis prior to discharge.  He was maintaining NSR and was stable for  transfer to the progressive care unit on 03/08/2021.  He continued to make progress.  He remained in NSR.  He remained volume overloaded and will require Lasix for a brief course at discharge.  He has been evaluated by PT who recommended home health.  Patient would also benefit from home nursing care to monitor change in his medical condition.  On 12/17  the patient again developed Atrial Fibrillation with RVR.  He was treated with an Amiodarone bolus with successful conversion to NSR.  His creatinine level elevated again, with most likely source being diureses.  His Spironolactone was discontinued and his lasix dose was decreased.  Repeat level improved to 1.42.  He was monitored by his Cardiologist who further adjusted his medication regimen.  He has remained clinically stable.  His is ambulating with assistance, home health care has been arranged.   His surgical incisions are healing without evidence of infection.  He is medically stable for discharge home today.

## 2021-03-01 NOTE — Brief Op Note (Signed)
03/01/2021  6:26 PM  PATIENT:  Keith Wagner  80 y.o. male  PRE-OPERATIVE DIAGNOSIS:  CORONARY ARTERY DISEASE, AORTIC STENOSIS  POST-OPERATIVE DIAGNOSIS:  CORONARY ARTERY DISEASE, AORTIC STENOSIS  PROCEDURE:  Procedure(s):  CORONARY ARTERY BYPASS GRAFTING x 2 -LIMA to LAD -SVG to OM AORTIC VALVE REPLACEMENT -21 mm Edwards Inspiris Resilia Bioprosthetic Valve ENDOVEIN HARVEST OF GREATER SAPHENOUS VEIN -Right Thigh (20/81min) REEXPLORATION FOR BLEEDING DUE TO RV PERFORATION  TRANSESOPHAGEAL ECHOCARDIOGRAM (TEE) (N/A) APPLICATION OF CELL SAVER   SURGEON:  Surgeon(s) and Role:    * [name], MD - Primary  PHYSICIAN ASSISTANT: Ellwood Handler PA-C  ASSISTANTS: Dineen Kid RNFA, Alcide Evener RNFA   ANESTHESIA:   general  EBL:  1100 mL   BLOOD ADMINISTERED:   CC CELLSAVER  DRAINS:  Left pleural Chest Drains, Mediastinal Chest Tubes    LOCAL MEDICATIONS USED:  NONE  SPECIMEN:  No Specimen  DISPOSITION OF SPECIMEN:  N/A  COUNTS:  YES  TOURNIQUET:  * No tourniquets in log *  DICTATION: .Dragon Dictation  PLAN OF CARE: Admit to inpatient   PATIENT DISPOSITION:  ICU - intubated and hemodynamically stable.   Delay start of Pharmacological VTE agent (>24hrs) due to surgical blood loss or risk of bleeding: yes

## 2021-03-01 NOTE — Anesthesia Preprocedure Evaluation (Signed)
Anesthesia Evaluation  Patient identified by MRN, date of birth, ID band Patient awake    Reviewed: Allergy & Precautions, NPO status , Patient's Chart, lab work & pertinent test results, reviewed documented beta blocker date and time   History of Anesthesia Complications Negative for: history of anesthetic complications  Airway Mallampati: II  TM Distance: >3 FB Neck ROM: Full    Dental  (+) Edentulous Upper, Edentulous Lower, Dental Advisory Given   Pulmonary asthma , former smoker,    breath sounds clear to auscultation       Cardiovascular hypertension, Pt. on medications and Pt. on home beta blockers (-) angina(-) Past MI and (-) CHF + Valvular Problems/Murmurs AS  Rhythm:Regular  1. Left ventricular ejection fraction, by estimation, is 60 to 65%. The  left ventricle has normal function. The left ventricle has no regional  wall motion abnormalities. There is mild left ventricular hypertrophy.  Left ventricular diastolic parameters  were normal. Elevated left atrial pressure.  2. Right ventricular systolic function is normal. The right ventricular  size is normal. There is normal pulmonary artery systolic pressure. The  estimated right ventricular systolic pressure is 02.5 mmHg.  3. The mitral valve is normal in structure. Mild mitral valve  regurgitation. No evidence of mitral stenosis.  4. Mild to moderate aortic stenosis (peak velocity 3.64m/s, PG 55mmHg, MG  79mmHG, AVA (VTI) 0.82cm2, DI 0.28.Marland Kitchen The aortic valve is calcified. Aortic  valve regurgitation is mild.   LM: Distal calcific 75% stenosis LAD: Mid 80% stenosis LCX: large vessel. Ostial 70% stenosis.  RCA: Dominant, but small vessel. No significant disease seen on well opacified non-selective angiogram.  Normal PCW Unable to cross aortic valve due to severe aorta tortuosity.  CVTS consult for CABG +/- AVR    Neuro/Psych negative neurological ROS   negative psych ROS   GI/Hepatic Neg liver ROS, GERD  Controlled,  Endo/Other  diabetesLab Results      Component                Value               Date                      HGBA1C                   6.3 (H)             02/27/2021             Renal/GU negative Renal ROSLab Results      Component                Value               Date                      CREATININE               0.80                03/01/2021                Musculoskeletal  (+) Arthritis ,   Abdominal   Peds  Hematology  (+) Blood dyscrasia, anemia , Lab Results      Component                Value               Date  WBC                      10.1                02/27/2021                HGB                      8.8 (L)             03/01/2021                HCT                      26.0 (L)            03/01/2021                MCV                      78.8 (L)            02/27/2021                PLT                      334                 02/27/2021              Anesthesia Other Findings   Reproductive/Obstetrics                             Anesthesia Physical Anesthesia Plan  ASA: 4  Anesthesia Plan: General   Post-op Pain Management:    Induction: Intravenous  PONV Risk Score and Plan: 2 and Treatment may vary due to age or medical condition  Airway Management Planned: Oral ETT  Additional Equipment: Arterial line, CVP, PA Cath, TEE and Ultrasound Guidance Line Placement  Intra-op Plan:   Post-operative Plan: Post-operative intubation/ventilation  Informed Consent: I have reviewed the patients History and Physical, chart, labs and discussed the procedure including the risks, benefits and alternatives for the proposed anesthesia with the patient or authorized representative who has indicated his/her understanding and acceptance.     Dental advisory given  Plan Discussed with: Anesthesiologist and CRNA  Anesthesia Plan Comments:          Anesthesia Quick Evaluation

## 2021-03-01 NOTE — Anesthesia Procedure Notes (Signed)
Arterial Line Insertion Start/End12/10/2020 7:00 AM, 03/01/2021 7:03 AM Performed by: Rachael Darby T, CRNA  Preanesthetic checklist: patient identified, IV checked, site marked, risks and benefits discussed, surgical consent, monitors and equipment checked, pre-op evaluation, timeout performed and anesthesia consent Lidocaine 1% used for infiltration Left, radial was placed Catheter size: 20 G Hand hygiene performed  and maximum sterile barriers used   Attempts: 1 Procedure performed without using ultrasound guided technique. Following insertion, dressing applied and Biopatch. Patient tolerated the procedure well with no immediate complications.

## 2021-03-01 NOTE — Progress Notes (Signed)
Patient ID: Keith Wagner, male   DOB: 17-Jan-1941, 80 y.o.   MRN: [id number]  TCTS Evening Rounds:   Hemodynamically stable  CI = 1.9  Extubated  Urine output good  CT output low  CBC    Component Value Date/Time   WBC 21.9 (H) 03/01/2021 1436   RBC 4.50 03/01/2021 1436   HGB 9.2 (L) 03/01/2021 1825   HGB 12.7 (L) 01/30/2021 0920   HCT 27.0 (L) 03/01/2021 1825   HCT 41.2 01/30/2021 0920   PLT 210 03/01/2021 1436   PLT 355 01/30/2021 0920   MCV 78.7 (L) 03/01/2021 1436   MCV 78 (L) 01/30/2021 0920   MCH 25.1 (L) 03/01/2021 1436   MCHC 31.9 03/01/2021 1436   RDW 16.9 (H) 03/01/2021 1436   RDW 15.4 01/30/2021 0920     BMET    Component Value Date/Time   NA 139 03/01/2021 1825   NA 139 01/30/2021 0921   K 4.0 03/01/2021 1825   CL 106 03/01/2021 1239   CO2 18 (L) 02/27/2021 1600   GLUCOSE 179 (H) 03/01/2021 1239   BUN 13 03/01/2021 1239   BUN 20 01/30/2021 0921   CREATININE 0.80 03/01/2021 1239   CALCIUM 8.9 02/27/2021 1600   EGFR 55 (L) 01/30/2021 0921   GFRNONAA >60 02/27/2021 1600     A/P:  Stable postop course. Continue current plans

## 2021-03-01 NOTE — Progress Notes (Signed)
VC: 800 mL NIF: -20

## 2021-03-01 NOTE — Interval H&P Note (Signed)
History and Physical Interval Note:  Cleared by dental  03/01/2021 7:48 AM  Keith Wagner  has presented today for surgery, with the diagnosis of CAD AS.  The various methods of treatment have been discussed with the patient and family. After consideration of risks, benefits and other options for treatment, the patient has consented to  Procedure(s): CORONARY ARTERY BYPASS GRAFTING (CABG) (N/A) AORTIC VALVE REPLACEMENT (AVR) (N/A) TRANSESOPHAGEAL ECHOCARDIOGRAM (TEE) (N/A) as a surgical intervention.  The patient's history has been reviewed, patient examined, no change in status, stable for surgery.  I have reviewed the patient's chart and labs.  Questions were answered to the patient's satisfaction.     [name]

## 2021-03-02 ENCOUNTER — Encounter (HOSPITAL_COMMUNITY): Payer: Self-pay | Admitting: Thoracic Surgery (Cardiothoracic Vascular Surgery)

## 2021-03-02 ENCOUNTER — Inpatient Hospital Stay (HOSPITAL_COMMUNITY): Payer: Medicare HMO

## 2021-03-02 LAB — CBC
HCT: 29.3 % — ABNORMAL LOW (ref 39.0–52.0)
HCT: 27.9 % — ABNORMAL LOW (ref 39.0–52.0)
Hemoglobin: 9.4 g/dL — ABNORMAL LOW (ref 13.0–17.0)
Hemoglobin: 9 g/dL — ABNORMAL LOW (ref 13.0–17.0)
MCH: 25.2 pg — ABNORMAL LOW (ref 26.0–34.0)
MCH: 25.1 pg — ABNORMAL LOW (ref 26.0–34.0)
MCHC: 32.1 g/dL (ref 30.0–36.0)
MCHC: 32.3 g/dL (ref 30.0–36.0)
MCV: 78.6 fL — ABNORMAL LOW (ref 80.0–100.0)
MCV: 77.9 fL — ABNORMAL LOW (ref 80.0–100.0)
Platelets: 175 10*3/uL (ref 150–400)
Platelets: 173 10*3/uL (ref 150–400)
RBC: 3.73 MIL/uL — ABNORMAL LOW (ref 4.22–5.81)
RBC: 3.58 MIL/uL — ABNORMAL LOW (ref 4.22–5.81)
RDW: 16.9 % — ABNORMAL HIGH (ref 11.5–15.5)
RDW: 17.2 % — ABNORMAL HIGH (ref 11.5–15.5)
WBC: 14.1 10*3/uL — ABNORMAL HIGH (ref 4.0–10.5)
WBC: 16.3 10*3/uL — ABNORMAL HIGH (ref 4.0–10.5)
nRBC: 0 % (ref 0.0–0.2)
nRBC: 0 % (ref 0.0–0.2)

## 2021-03-02 LAB — MAGNESIUM
Magnesium: 2.8 mg/dL — ABNORMAL HIGH (ref 1.7–2.4)
Magnesium: 2.5 mg/dL — ABNORMAL HIGH (ref 1.7–2.4)

## 2021-03-02 LAB — BASIC METABOLIC PANEL
Anion gap: 9 (ref 5–15)
Anion gap: 8 (ref 5–15)
BUN: 10 mg/dL (ref 8–23)
BUN: 12 mg/dL (ref 8–23)
CO2: 22 mmol/L (ref 22–32)
CO2: 19 mmol/L — ABNORMAL LOW (ref 22–32)
Calcium: 7.8 mg/dL — ABNORMAL LOW (ref 8.9–10.3)
Calcium: 7.7 mg/dL — ABNORMAL LOW (ref 8.9–10.3)
Chloride: 104 mmol/L (ref 98–111)
Chloride: 101 mmol/L (ref 98–111)
Creatinine, Ser: 1.05 mg/dL (ref 0.61–1.24)
Creatinine, Ser: 1.54 mg/dL — ABNORMAL HIGH (ref 0.61–1.24)
GFR, Estimated: >60 mL/min (ref >60)
GFR, Estimated: 46 mL/min — ABNORMAL LOW (ref >60)
Glucose, Bld: 118 mg/dL — ABNORMAL HIGH (ref 70–99)
Glucose, Bld: 139 mg/dL — ABNORMAL HIGH (ref 70–99)
Potassium: 4.4 mmol/L (ref 3.5–5.1)
Potassium: 4.1 mmol/L (ref 3.5–5.1)
Sodium: 135 mmol/L (ref 135–145)
Sodium: 128 mmol/L — ABNORMAL LOW (ref 135–145)

## 2021-03-02 LAB — SURGICAL PATHOLOGY

## 2021-03-02 LAB — GLUCOSE, CAPILLARY
Glucose-Capillary: 101 mg/dL — ABNORMAL HIGH (ref 70–99)
Glucose-Capillary: 112 mg/dL — ABNORMAL HIGH (ref 70–99)
Glucose-Capillary: 118 mg/dL — ABNORMAL HIGH (ref 70–99)
Glucose-Capillary: 123 mg/dL — ABNORMAL HIGH (ref 70–99)
Glucose-Capillary: 123 mg/dL — ABNORMAL HIGH (ref 70–99)
Glucose-Capillary: 125 mg/dL — ABNORMAL HIGH (ref 70–99)
Glucose-Capillary: 126 mg/dL — ABNORMAL HIGH (ref 70–99)
Glucose-Capillary: 134 mg/dL — ABNORMAL HIGH (ref 70–99)
Glucose-Capillary: 137 mg/dL — ABNORMAL HIGH (ref 70–99)
Glucose-Capillary: 138 mg/dL — ABNORMAL HIGH (ref 70–99)
Glucose-Capillary: 147 mg/dL — ABNORMAL HIGH (ref 70–99)
Glucose-Capillary: 148 mg/dL — ABNORMAL HIGH (ref 70–99)
Glucose-Capillary: 149 mg/dL — ABNORMAL HIGH (ref 70–99)
Glucose-Capillary: 153 mg/dL — ABNORMAL HIGH (ref 70–99)
Glucose-Capillary: 160 mg/dL — ABNORMAL HIGH (ref 70–99)

## 2021-03-02 MED ORDER — ENOXAPARIN SODIUM 40 MG/0.4ML IJ SOSY
40.0000 mg | PREFILLED_SYRINGE | Freq: Every day | INTRAMUSCULAR | Status: DC
  Administered 2021-03-02: 40 mg via SUBCUTANEOUS
  Filled 2021-03-02: qty 0.4

## 2021-03-02 MED ORDER — ALBUMIN HUMAN 5 % IV SOLN
12.5000 g | Freq: Once | INTRAVENOUS | Status: AC
  Administered 2021-03-02: 12.5 g via INTRAVENOUS
  Filled 2021-03-02: qty 250

## 2021-03-02 MED ORDER — ALBUMIN HUMAN 5 % IV SOLN
12.5000 g | Freq: Once | INTRAVENOUS | Status: AC
  Administered 2021-03-02: 12.5 g via INTRAVENOUS

## 2021-03-02 MED ORDER — INSULIN ASPART 100 UNIT/ML IJ SOLN
0.0000 [IU] | INTRAMUSCULAR | Status: DC
  Administered 2021-03-02 – 2021-03-03 (×5): 2 [IU] via SUBCUTANEOUS

## 2021-03-02 MED ORDER — INSULIN DETEMIR 100 UNIT/ML ~~LOC~~ SOLN
15.0000 [IU] | Freq: Two times a day (BID) | SUBCUTANEOUS | Status: DC
  Administered 2021-03-02 (×2): 15 [IU] via SUBCUTANEOUS
  Filled 2021-03-02 (×4): qty 0.15

## 2021-03-02 MED ORDER — FUROSEMIDE 10 MG/ML IJ SOLN
40.0000 mg | Freq: Once | INTRAMUSCULAR | Status: AC
  Administered 2021-03-02: 40 mg via INTRAVENOUS
  Filled 2021-03-02: qty 4

## 2021-03-02 MED ORDER — ALBUMIN HUMAN 5 % IV SOLN
INTRAVENOUS | Status: AC
  Filled 2021-03-02: qty 250

## 2021-03-02 MED ORDER — MAGNESIUM OXIDE -MG SUPPLEMENT 400 (240 MG) MG PO TABS
400.0000 mg | ORAL_TABLET | Freq: Every day | ORAL | Status: DC
  Administered 2021-03-02 – 2021-03-12 (×11): 400 mg via ORAL
  Filled 2021-03-02 (×11): qty 1

## 2021-03-02 NOTE — Plan of Care (Signed)

## 2021-03-02 NOTE — Anesthesia Postprocedure Evaluation (Signed)
Anesthesia Post Note  Patient: Keith Wagner  Procedure(s) Performed: CORONARY ARTERY BYPASS GRAFTING (CABG) X 2 USING LEFT INTERNAL MAMMARY ARTERY AND RIGHT ENDOSCOPIC GREATER SAPHENOUS VEIN CONDUITS (Chest) AORTIC VALVE REPLACEMENT (AVR) WITH INSPIRIS RESILIA  AORTIC VALVE SIZE 21MM (Chest) TRANSESOPHAGEAL ECHOCARDIOGRAM (TEE) APPLICATION OF CELL SAVER ENDOVEIN HARVEST OF GREATER SAPHENOUS VEIN (Right) CHEST EXPLORATION (Chest)     Patient location during evaluation: SICU Anesthesia Type: General Level of consciousness: sedated Pain management: pain level controlled Vital Signs Assessment: post-procedure vital signs reviewed and stable Respiratory status: patient remains intubated per anesthesia plan Cardiovascular status: stable Postop Assessment: no apparent nausea or vomiting Anesthetic complications: no   No notable events documented.  Last Vitals:  Vitals:   03/02/21 1145 03/02/21 1200  BP:  100/64  Pulse: 77 77  Resp: 14 (!) 21  Temp:    SpO2: 97% 100%    Last Pain:  Vitals:   03/02/21 1200  TempSrc:   PainSc: 5                  Duff Pozzi

## 2021-03-02 NOTE — Progress Notes (Signed)
      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 Placed back to bed and DDD pacing started.  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 DDD pacing at 80.  Hold beta blocker. Will give albumin as neo up to 60 mcg/min  Remo Lipps C. Roxan Hockey, MD [facility] [phone]

## 2021-03-02 NOTE — Discharge Instructions (Addendum)
Discharge Instructions:  1. You may shower, please wash incisions daily with soap and water and keep dry.  If you wish to cover wounds with dressing you may do so but please keep clean and change daily.  No tub baths or swimming until incisions have completely healed.  If your incisions become red or develop any drainage please call our office at [phone]  2. No Driving until cleared by Dr. Hendrickson's office and you are no longer using narcotic pain medications  3. Monitor your weight daily.. Please use the same scale and weigh at same time... If you gain 5-10 lbs in 48 hours with associated lower extremity swelling, please contact our office at [phone]  4. Fever of 101.5 for at least 24 hours with no source, please contact our office at [phone]  5. Activity- up as tolerated, please walk at least 3 times per day.  Avoid strenuous activity, no lifting, pushing, or pulling with your arms over 8-10 lbs for a minimum of 6 weeks  6. If any questions or concerns arise, please do not hesitate to contact our office at [phone]   Information on my medicine - ELIQUIS (apixaban)  Why was Eliquis prescribed for you? Eliquis was prescribed for you to reduce the risk of a blood clot forming that can cause a stroke if you have a medical condition called atrial fibrillation (a type of irregular heartbeat).  What do You need to know about Eliquis ? Take your Eliquis TWICE DAILY - one tablet in the morning and one tablet in the evening with or without food. If you have difficulty swallowing the tablet whole please discuss with your pharmacist how to take the medication safely.  Take Eliquis exactly as prescribed by your doctor and DO NOT stop taking Eliquis without talking to the doctor who prescribed the medication.  Stopping may increase your risk of developing a stroke.  Refill your prescription before you run out.  After discharge, you should have regular check-up appointments  with your healthcare provider that is prescribing your Eliquis.  In the future your dose may need to be changed if your kidney function or weight changes by a significant amount or as you get older.  What do you do if you miss a dose? If you miss a dose, take it as soon as you remember on the same day and resume taking twice daily.  Do not take more than one dose of ELIQUIS at the same time to make up a missed dose.  Important Safety Information A possible side effect of Eliquis is bleeding. You should call your healthcare provider right away if you experience any of the following: Bleeding from an injury or your nose that does not stop. Unusual colored urine (red or dark brown) or unusual colored stools (red or black). Unusual bruising for unknown reasons. A serious fall or if you hit your head (even if there is no bleeding).  Some medicines may interact with Eliquis and might increase your risk of bleeding or clotting while on Eliquis. To help avoid this, consult your healthcare provider or pharmacist prior to using any new prescription or non-prescription medications, including herbals, vitamins, non-steroidal anti-inflammatory drugs (NSAIDs) and supplements.  This website has more information on Eliquis (apixaban): http://www.eliquis.com/eliquis/home   

## 2021-03-02 NOTE — Op Note (Signed)
NAME: Keith Wagner, Keith Wagner MEDICAL RECORD NO: [id number] ACCOUNT NO: [id number] DATE OF BIRTH: 1940/10/16 FACILITY: [facility] LOCATION: [facility] PHYSICIAN: Revonda Standard. Roxan Hockey, MD  Operative Report   DATE OF PROCEDURE: 03/01/2021  PREOPERATIVE DIAGNOSIS:  Left main disease and aortic stenosis.  POSTOPERATIVE DIAGNOSIS:  Left main disease and aortic stenosis.  PROCEDURES PERFORMED:   Median sternotomy, extracorporeal circulation, Aortic valve replacement with 21 mm Edwards Inspiris Resilia bioprosthetic valve, Coronary artery bypass grafting x 2  Left internal mammary artery to LAD, Saphenous vein graft to obtuse marginal 2,  Endoscopic vein harvest, right thigh, Reexploration for bleeding due to right ventricular perforation.  SURGEON:  Dr. Modesto Charon.  ASSISTANT: Ellwood Handler, PA  ANESTHESIA:  General.  FINDINGS: Transesophageal echocardiography showed a valve area calculated at 1.0 cm2-1.2 cm2.  Ejection fraction was approximately 50%.  Post-bypass transesophageal echocardiography revealed no change in left ventricular function and good function of the  prosthetic valve with no paravalvular leaks.  Good quality coronaries.  Good quality targets.  CLINICAL NOTE: Keith Wagner is a 80 year old gentleman with multiple cardiac risk factors and known compensated class 3 congestive heart failure.  He presented with shortness of breath, congestion, was found to have pulmonary edema and elevated BNP.   An echocardiogram showed ejection fraction of 30-35%. Aortic valve gradients were consistent with mild to moderate aortic stenosis with a valve area calculated at 0.9 cm2.  He underwent cardiac catheterization, which showed left main and 2-vessel  coronary artery disease.  He was referred for coronary artery bypass grafting, possible aortic valve replacement.  The indications, risks, benefits, and alternatives were discussed in detail with the patient.  He understood and accepted the risks  and  agreed to proceed.  It was agreed that a tissue valve would be placed if an aortic valve replacement was necessary.  OPERATIVE NOTE:  Keith Wagner was brought to the preoperative holding area on 03/01/2021. The anesthesia service placed a Swan-Ganz catheter and an arterial blood pressure monitoring line.  He was taken to the operating room and anesthetized and  intubated.  A Foley catheter was placed.  Intravenous antibiotics were administered.  Dr. Oleta Mouse performed a transesophageal echocardiography.  Please refer to his separately dictated note for full details of the procedure.  Findings were as  noted briefly above.  The chest, abdomen and legs were prepped and draped in the usual sterile fashion.  Timeout was performed.  An incision was made in the medial aspect of the right leg at the level of the knee.  The greater saphenous vein was harvested from the right thigh endoscopically.  This was performed by Ellwood Handler, PA independently while I  performed the median sternotomy and harvested the left internal mammary artery using standard technique.  2000 units of heparin was administered during the vessel harvest.  The remainder of the full heparin dose was given prior to opening the  pericardium. Both the vein and mammary were excellent quality vessels.  After harvesting the conduits, the sternal retractor was placed.  The pericardium was opened.  The ascending aorta was inspected.  There was no evidence of atherosclerotic disease.  After confirming adequate anticoagulation with ACT measurement, the  aorta was cannulated via concentric 2-0 Ethibond pledgeted pursestring sutures.  A dual stage venous cannula was placed via a pursestring suture in the right atrial appendage.  Cardiopulmonary bypass was initiated.  Flows were maintained per protocol.   The patient was cooled to 32 degrees Celsius.  The coronary arteries were inspected  and anastomotic sites were chosen.  Of note, there  was a very small acute marginal branch off the right and no graftable targets in the right coronary system.  The left  sided targets were good quality vessels.  The conduits were inspected and cut to length.  A left ventricular vent was placed via a pursestring suture in the right superior pulmonary vein.  A retrograde cardioplegia cannula was placed via a pursestring suture  in the right atrium and directed into the coronary sinus and antegrade cardioplegia cannula was placed in the ascending aorta.  A temperature probe was placed in the myocardial septum.  Carbon dioxide was insufflated into the operative field per  protocol.  The aorta was cross clamped.  The left ventricle was emptied via the aortic root vent. Cardiac arrest then was achieved with a combination of cold antegrade blood cardioplegia and retrograde blood cardioplegia and topical iced saline. KBC cardioplegia  was used.  An initial 500 mL was given antegrade.  There was a rapid diastolic arrest.  The remainder of the calculated dose of 1200 mL was given retrograde and there was septal cooling to 10 degrees Celsius.  A reversed saphenous vein graft was placed end-to-side to the second obtuse marginal branch of the left circumflex.  The marginals arose as a common branch and then bifurcated.  There was no significant disease between OM1 and OM2.  The OM2 was grafted because it  was the larger vessel, it was a 2 mm good quality target.  The vein was of good quality.  It was anastomosed end-to-side with a running 7-0 Prolene suture.  At the completion of the anastomosis, a probe passed easily proximally and distally.   Cardioplegia was administered and there was good flow and good hemostasis.  The left internal mammary artery was brought through a window in the pericardium.  The distal end was bevelled.  It was anastomosed end-to-side to the distal LAD.  The LAD was intramyocardial, it was grafted just where it came back to the epicardial   surface.  It was 2 mm, good quality target and the mammary was 2 mm, good quality conduit.  The end-to-side anastomosis was performed with a running 8-0 Prolene suture.  At the completion of the anastomosis, the bulldog clamp was briefly removed to inspect for hemostasis.  Rapid septal rewarming was noted.  The bulldog clamp was replaced and the mammary pedicle was tacked to the epicardial surface of the heart with 6-0 Prolene sutures.  An additional dose of cardioplegia was administered retrograde to once again achieve septal cooling.  A transverse aortotomy was performed and extended into the noncoronary sinus.  The aortic valve was inspected.  It was a tricuspid, calcific, stenotic valve.   There was calcification primarily at the base of the leaflets.  There was mild annular calcification.  The leaflets were excised.  The annular calcium was debrided.  Care was taken to contain all calcific debris.  The annulus was copiously irrigated  with ice saline.  It sized for a 21 mm Inspiris Resilia valve.  The valve was prepared per manufacturer's recommendations.  2-0 Ethibond horizontal mattress sutures were placed circumferentially around the annulus, a total of 15 sutures were utilized.  The sutures were placed thought the sewing ring of the valve and the valve was lowered into place.  The valve seated nicely.  The coronary ostia were identified and were free of any obstruction.  The sutures then were secured using Cor-Knot device.  Rewarming  was begun.  The annulus was inspected and no areas of concern  were noted.  The coronary ostia were inspected again.  The aortotomy then was closed in two layers, the first layer was a running 4-0 Prolene horizontal mattress suture followed by running 4-0 Prolene simple suture.  The vein graft was cut to length.  Cardioplegia cannula was removed from the ascending aorta.  The proximal vein graft anastomosis was performed to a 4.5 mm punch aortotomy with a running 6-0  Prolene suture.  At the completion of the final proximal  anastomosis, the patient was placed in Trendelenburg position.  The reanimation dose of cardioplegia was administered retrograde.  The bulldog clamp was removed from the left mammary artery.  De-airing was performed and the aortic crossclamp was removed.   The total crossclamp time was 94 minutes.  The patient initially resumed sinus rhythm, but later had some bigeminy, which was treated with amiodarone.  He did not require defibrillation.  While rewarming was completed.  All proximal and distal anastomoses and the aortotomy were inspected for hemostasis.  Epicardial pacing wires were placed on the right ventricle and right atrium.  The retrograde cardioplegia cannula was removed.  The left  ventricular vent was removed and additional de-airing was performed.  The patient then was weaned from cardiopulmonary bypass on the first attempt.  He did not require inotropic support.  Total bypass time was 133 minutes.  The post-bypass  transesophageal echocardiography showed no change in left ventricular function.  There was good function of the prosthetic valve with no paravalvular leaks.  The patient had episodes of bigeminy and intermittently required pacing.  His blood pressure and cardiac index did drop with the bigeminy.  Ultimately, he was started on amiodarone, a bolus was given and then a drip was initiated and he was DDD paced.   After weaning from bypass, a test dose of protamine was administered.  The atrial and aortic cannula were removed.  The remainder of the protamine was administered.  The chest was copiously irrigated with warm saline.  Hemostasis was achieved.  Left  pleural and mediastinal chest tubes were placed through separate subcostal incisions.  The pericardium was reapproximated over the ascending aorta with interrupted 3-0 silk sutures.  The sternum was closed with a combination of single and double heavy gauge  stainless steel  wires.  The pectoralis fascia and subcutaneous tissue were closed in standard fashion.  The skin was closed with a subcuticular suture.  Just as I was breaking scrub, the surgical tech noted with suctioning the mediastinal chest tube, there was a large amount of  dark blood coming from the tube.  The patient became hypotensive.  I rapidly scrubbed back in and reopened the sternum.  Sternal retractor was placed.  A large amount of dark blood was present in the pericardium and there was a perforation  in the right ventricular wall adjacent to a side hole of the mediastinal chest tube.  The patient responded to volume administration, and did not require open massage.  The right ventricular perforation was closed with a 4-0 Prolene pledgeted horizontal  mattress suture.  The patient then stabilized with volume resuscitation.  After observation and no other bleeding sites being noted, the sternum and chest wall were closed again.  The patient tolerated that well hemodynamically.  He then was transported  from the operating room to the surgical intensive care unit, intubated in good condition.  All sponge, needle and instrument counts were correct at  the end of the procedure. An experienced assistant was necessary for this case due to complexity.    Experienced assistance was necessary during thisd case due to complexity.  Erin Barrett harvested the vein and performed closure of the leg wounds and then provided exposure, retraction, suctioning and suture management during the coronary anastomoses and valve replacement.   [facility] D: 03/01/2021 6:42:00 pm T: 03/02/2021 12:28:00 am  JOB: [id number]/ [id number]

## 2021-03-02 NOTE — Discharge Summary (Signed)
[address]       [place] [address]             [phone]    Physician Discharge Summary  Patient ID: Keith Wagner MRN: [id number] DOB/AGE: Jun 11, 1940 80 y.o.  Admit date: 03/01/2021 Discharge date: 03/12/2021  Admission Diagnoses:  Patient Active Problem List    Date Noted   HFrEF (heart failure with reduced ejection fraction) (Keith Wagner) 02/06/2021   Bilateral carotid bruits 06/12/2018   Mild aortic stenosis 06/12/2018   Laboratory examination 06/12/2018   Hypercholesteremia 06/12/2018   Benign essential hypertension 11/07/2014   Discharge Diagnoses:  Patient Active Problem List   Diagnosis Date Noted   Long term (current) use of anticoagulants    Long term current use of antiarrhythmic drug    AKI (acute kidney injury) ([facility])    Anemia associated with acute blood loss    CAD, multiple vessel    Pressure injury of skin 03/09/2021   S/P AVR 75/12/2583   Acute diastolic heart failure (HCC)    PAF (paroxysmal atrial fibrillation) (HCC)    Chronic renal failure, stage 3b (HCC)    Sinus node dysfunction (HCC)    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  He also has a history of NYHA class III congestive heart failure, currently well compensated.  Back in August and September he was having shortness of breath and congestion.  Chest x-ray showed signs of pulmonary edema and his BNP was elevated.  Improved with diuresis.  His wife says he is no longer complaining of shortness of breath but that he has cut his activities back  and he is exhausted all the time.  He had an echocardiogram which showed an ejection fraction of 30 to 35%.  Aortic valve gradients were only mild but his calculated valve area was 0.9 cm.  Reviewing the images the valve is heavily calcified.  He underwent cardiac catheterization which showed left main and two-vessel coronary disease.  It was felt coronary bypass grafting would be indicated with possible Aortic Valve Replacement.  He was evaluated by Keith Wagner who was in agreement the patient the would benefit from surgical intervention.  He would require dental evaluation prior to proceeding with surgery.  He was agreeable to get this done.  The risks and benefits of the procedure were explained to the patient and she was agreeable to proceed.  Surgery was scheduled for 03/01/2021.  Wagner course:  Keith Wagner presented to [facility] Wagner on 03/01/2021.  The patient was taken to the operating room and underwent CABG x 2 utilizing LIMA to LAD, SVG to OM and Aortic Valve Replacement with a 21 mm Edwards Inspiris Resilia Bioprosthetic valve.  He tolerated the procedure without difficulty and was taken to the [facility] in stable condition.  He was extubated overnight.  He was weaned off Neo-synephrine as hemodynamics allowed.  His post operative EKG showed NSR with PACs.  His heart rate was low and he was temporarily paced.  He was felt to be intravascularly dry and treated with Albumin.  Once this improved  lasix was initiated.  His chest tubes and arterial lines were removed without difficulty.  By the evening of the first postoperative day, he had developed complete heart block with slow escape rhythm in the 30s.  Pacing by way of the epicardial pacing lead was intermittent.  Cardiology was consulted.  Patient was seen by Keith Wagner and decision was made to proceed with placement of a transvenous temporary pacing lead for hemodynamic stabilization.  This procedure was successfully carried out on  03/03/2021.  An echocardiogram was also performed that showed no pericardial effusion.  LV function was good with no significant wall motion abnormalities.  The valve was functioning appropriately with no perivalvular leak.  THe patient developed Atrial Fibrillation and was treated with Amiodarone bolus and drip per protocol. He converted to NSR, but unfortunately he experienced additional episodes which required Amiodarone bolus for treatment.  He developed AKI with decreased U/O and was started on Dopamine.  He responded well to this with improvement in his creatinine level and good u/o response with diuresis.  He was weaned off Dopamine on 03/06/2021.  The patient required HFNC which was weaned as tolerated.  His temporary pacer was removed on 03/06/2021.  He was started on Lopressor for additional HR control and Cozaar this his CHF.  He was started on Heparin for Atrial Fibrillation.  His temporary pacing wires were removed without difficulty.  He will be started on Eliquis prior to discharge.  He was maintaining NSR and was stable for transfer to the progressive care unit on 03/08/2021.  He continued to make progress.  He remained in NSR.  He remained volume overloaded and will require Lasix for a brief course at discharge.  He has been evaluated by PT who recommended home health.  Patient would also benefit from home nursing care to monitor change in his medical condition.  On 12/17 the patient again developed Atrial Fibrillation with RVR.  He was treated with an Amiodarone bolus with successful conversion to NSR.  His creatinine level elevated again, with most likely source being diureses.  His Spironolactone was discontinued and his lasix dose was decreased.  Repeat level improved to 1.42.  He was monitored by his Cardiologist who further adjusted his medication regimen.  He has remained clinically stable.  His is ambulating with assistance, home health care has been arranged.   His surgical incisions are  healing without evidence of infection.  He is medically stable for discharge home today.  Consults: cardiology and pulmonary/intensive care  Significant Diagnostic Studies: angiography:   LM: Distal calcific 75% stenosis LAD: Mid 80% stenosis LCX: large vessel. Ostial 70% stenosis.  RCA: Dominant, but small vessel. No significant disease seen on well opacified non-selective angiogram.   Normal PCW Unable to cross aortic valve due to severe aorta tortuosity.   CVTS consult for CABG +/- AVR  ECHO:  Echocardiogram 12/07/2020:  Moderately depressed LV systolic function with visual EF 30-35%. Left  ventricle cavity is dilated. Moderate concentric hypertrophy of the left  ventricle. Hypokinetic global wall motion. Doppler evidence of grade II  (pseudonormal) diastolic dysfunction, elevated LAP.  Left atrial cavity is mildly dilated.  Mild aortic stenosis (peak velocity 2.57m/s, Peak gradient 76mmHg, Mean  gradient 54mmHg, AVA 0.9cm2, DI 0.30) severity of AS may be under  appreciated due to low flow low gradient AS.  Moderate (Grade III) aortic regurgitation.  Moderate (Grade II) mitral regurgitation.  Mild tricuspid regurgitation. Mild pulmonary hypertension. RVSP measures  44 mmHg.  IVC is  normal with a respiratory response of <50%.  Compared to study 05/25/2019: LVEF 50-55% is now 30-35%, G1DD is now G2DD  with elevated LAP, PHTN is Keith., otherwise no significant change.   Treatments: surgery:   PHYSICIAN: Revonda Standard. Roxan Hockey, MD   Operative Report    DATE OF PROCEDURE: 03/01/2021   PREOPERATIVE DIAGNOSIS:  Left main disease and aortic stenosis.   POSTOPERATIVE DIAGNOSIS:  Left main disease and aortic stenosis.   PROCEDURES PERFORMED:  Median sternotomy, extracorporeal circulation, aortic valve replacement with 21 mm Edwards Inspiris Resilia bioprosthetic valve, coronary artery bypass grafting x 2 (left internal mammary artery to LAD, saphenous vein graft to the  obtuse  marginal 2), endoscopic vein harvest, right thigh, reexploration for bleeding due to right ventricular perforation.   SURGEON:  Dr. Modesto Charon.   ASSISTANT: Ellwood Handler, she is a PA  Discharge Exam: Blood pressure 127/84, pulse 60, temperature 97.8 F (36.6 C), temperature source Oral, resp. rate 11, height 5\' 6"  (1.676 m), weight 81.8 kg, SpO2 96 %.  General appearance: alert, cooperative, and no distress Heart: regular rate and rhythm Lungs: min dim left>right base Abdomen: benign Extremities: minor edema Wound: incis healing well   Discharge Medications:  The patient has been discharged on: 1.Beta Blocker:  Yes [ X  ]                              No   [   ]                              If No, reason:  2.Ace Inhibitor/ARB: Yes [  X ]                                     No  [    ]                                     If No, reason:  3.Statin:   Yes [ X  ]                  No  [   ]                  If No, reason:  4.Ecasa:  Yes  Valu.Nieves   ]                  No   [   ]                  If No, reason:  Patient had ACS upon admission:  Plavix/P2Y12 inhibitor: Yes [   ]                                      No  [ X  ]     Discharge Instructions     Amb Referral to Cardiac Rehabilitation   Complete by: As directed    Diagnosis:  CABG Valve Replacement     Valve: Aortic   CABG X ___: 2   After initial evaluation and assessments completed: Virtual Based Care may be provided alone or  in conjunction with Phase 2 Cardiac Rehab based on patient barriers.: Yes   Discharge patient   Complete by: As directed    Discharge disposition: 01-Home or Self Care   Discharge patient date: 03/12/2021      Allergies as of 03/12/2021   No Known Allergies      Medication List     STOP taking these medications    Entresto 97-103 MG Generic drug: sacubitril-valsartan   Mucinex Maximum Strength 1200 MG Tb12 Generic drug: Guaifenesin   spironolactone 25 MG  tablet Commonly known as: ALDACTONE       TAKE these medications    acetaminophen 500 MG tablet Commonly known as: TYLENOL Take 1,000 mg by mouth every 6 (six) hours as needed for moderate pain.   amiodarone 200 MG tablet Commonly known as: PACERONE Take 1 tablet (200 mg total) by mouth 2 (two) times daily. X 7 days, then decrease to 200 mg daily   apixaban 5 MG Tabs tablet Commonly known as: ELIQUIS Take 1 tablet (5 mg total) by mouth 2 (two) times daily.   aspirin 81 MG tablet Take 81 mg by mouth daily.   atorvastatin 40 MG tablet Commonly known as: LIPITOR Take 1 tablet (40 mg total) by mouth daily. What changed:  medication strength how much to take   calcium carbonate 500 MG chewable tablet Commonly known as: TUMS - dosed in mg elemental calcium Chew 500-1,000 mg by mouth daily as needed for indigestion or heartburn.   clobetasol cream 0.05 % Commonly known as: TEMOVATE Apply 1 application topically 2 (two) times daily as needed (eczema).   dapagliflozin propanediol 10 MG Tabs tablet Commonly known as: Farxiga Take 1 tablet (10 mg total) by mouth daily before breakfast.   feeding supplement Liqd Take 237 mLs by mouth 2 (two) times daily between meals.   Fish Oil 1000 MG Caps Take 1,000 mg by mouth daily.   furosemide 40 MG tablet Commonly known as: LASIX Take 1 tablet (40 mg total) by mouth daily.   losartan 25 MG tablet Commonly known as: COZAAR Take 1 tablet (25 mg total) by mouth daily. Start taking on: March 13, 2021   Lutein 6 MG Tabs Take 6 mg by mouth daily.   magnesium oxide 400 MG tablet Commonly known as: MAG-OX Take 1 tablet (400 mg total) by mouth daily.   metoprolol succinate 25 MG 24 hr tablet Commonly known as: TOPROL-XL TAKE 1 TABLET BY MOUTH ONCE DAILY. HOLD IF TOP BLOOD PRESSURE NUMBER IS LESS THAN 100 MMHG OR PILSE LES THAN 60 BPM   MULTIVITAMIN PO Take 1 tablet by mouth daily.   OVER THE COUNTER MEDICATION Take 1  tablet by mouth daily. Super Beta Prostate Advanced   oxyCODONE 5 MG immediate release tablet Commonly known as: Oxy IR/ROXICODONE Take 1 tablet (5 mg total) by mouth every 6 (six) hours as needed for up to 7 days for severe pain.   potassium chloride SA 20 MEQ tablet Commonly known as: KLOR-CON M Take 1 tablet (20 mEq total) by mouth daily.   triamcinolone cream 0.1 % Commonly known as: KENALOG Apply 1 application topically 2 (two) times daily as needed (eczema).               Durable Medical Equipment  (From admission, onward)           Start     Ordered   03/11/21 0838  For home use only DME Walker rolling  Once  Question Answer Comment  Walker: With [place]   Patient needs a walker to treat with the following condition Physical deconditioning   Patient needs a walker to treat with the following condition S/P CABG (coronary artery bypass graft)   Patient needs a walker to treat with the following condition S/P AVR      03/11/21 0837   03/09/21 1403  For home use only DME 3 n 1  Once        03/09/21 1402            Follow-up Information     [name], MD Follow up on 04/03/2021.   Specialty: Cardiothoracic Surgery Why: Appointment is at 10:45, please get CXR at 10:15 at [facility] located on first floor of our office building Contact information: [place] [address] [place], [place], DO Follow up on 03/15/2021.   Specialties: Cardiology, Vascular Surgery Why: Appointment is at 3:00 Contact information: [place] [address] [place], [facility] Follow up.   Specialty: [place] Why: HHRN/PT arranged- they will contact you to schedule home visits Contact information: [place] [address] [phone]         [facility] Follow up.   Why: 3n1 arranged- to be  delivered to room prior to discharge Contact information: [place] [address] [phone]                 Signed: @mec @ 03/12/2021, 12:50 PM

## 2021-03-02 NOTE — Progress Notes (Signed)
Upon ambulation, patient became dizzy, pacemaker stopped capturing, CHB noted on monitor, placed in DDD mode with positive capture.  Patient put back to bed, Neo increased to 60 mcg/min for hypotension.  Dr. Roxan Hockey paged and made aware, to bedside, see orders.

## 2021-03-02 NOTE — Progress Notes (Signed)
1 Day Post-Op Procedure(s) (LRB): CORONARY ARTERY BYPASS GRAFTING (CABG) X 2 USING LEFT INTERNAL MAMMARY ARTERY AND RIGHT ENDOSCOPIC GREATER SAPHENOUS VEIN CONDUITS (N/A) AORTIC VALVE REPLACEMENT (AVR) WITH INSPIRIS RESILIA  AORTIC VALVE SIZE 21MM (N/A) TRANSESOPHAGEAL ECHOCARDIOGRAM (TEE) (N/A) APPLICATION OF CELL SAVER ENDOVEIN HARVEST OF GREATER SAPHENOUS VEIN (Right) CHEST EXPLORATION Subjective: Mild discomfort, no nausea  Objective: Vital signs in last 24 hours: Temp:  [97.2 F (36.2 C)-98.2 F (36.8 C)] 98.1 F (36.7 C) (12/09 0700) Pulse Rate:  [74-90] 78 (12/09 0700) Cardiac Rhythm: Atrial paced (12/09 0600) Resp:  [0-23] 21 (12/09 0700) BP: (85-120)/(8-95) 115/95 (12/09 0700) SpO2:  [93 %-100 %] 98 % (12/09 0700) Arterial Line BP: (87-133)/(57-81) 116/73 (12/09 0700) FiO2 (%):  [40 %-50 %] 40 % (12/08 1744) Weight:  [83.5 kg] 83.5 kg (12/09 0500)  Hemodynamic parameters for last 24 hours: PAP: (15-32)/(6-19) 32/14 CVP:  [10 mmHg] 10 mmHg CO:  [2.1 L/min-3.3 L/min] 3.3 L/min CI:  [1.2 L/min/m2-1.8 L/min/m2] 1.8 L/min/m2  Intake/Output from previous day: 12/08 0701 - 12/09 0700 In: 7614.6 [P.O.:100; I.V.:4954.6; Blood:1110; IV Piggyback:1450] Out: 4239 [Urine:3885; Blood:1100; Chest Tube:235] Intake/Output this shift: Total I/O In: 480 [P.O.:480] Out: 60 [Chest Tube:60]  General appearance: alert, cooperative, and no distress Neurologic: intact Heart: regular rate and rhythm Lungs: diminished breath sounds bibasilar Abdomen: normal findings: soft, non-tender  Lab Results: Recent Labs    03/01/21 2008 03/02/21 0326  WBC 14.4* 14.1*  HGB 9.3* 9.4*  HCT 29.7* 29.3*  PLT 149* 175   BMET:  Recent Labs    03/01/21 2008 03/02/21 0326  NA 133* 135  K 4.6 4.4  CL 107 104  CO2 20* 22  GLUCOSE 178* 118*  BUN 11 10  CREATININE 0.87 1.05  CALCIUM 8.0* 7.8*    PT/INR:  Recent Labs    03/01/21 1436  LABPROT 17.0*  INR 1.4*   ABG    Component  Value Date/Time   PHART 7.364 03/01/2021 1931   HCO3 20.7 03/01/2021 1931   TCO2 22 03/01/2021 1931   ACIDBASEDEF 4.0 (H) 03/01/2021 1931   O2SAT 95.0 03/01/2021 1931   CBG (last 3)  Recent Labs    03/02/21 0319 03/02/21 0429 03/02/21 0641  GLUCAP 125* 112* 148*    Assessment/Plan: S/P Procedure(s) (LRB): CORONARY ARTERY BYPASS GRAFTING (CABG) X 2 USING LEFT INTERNAL MAMMARY ARTERY AND RIGHT ENDOSCOPIC GREATER SAPHENOUS VEIN CONDUITS (N/A) AORTIC VALVE REPLACEMENT (AVR) WITH INSPIRIS RESILIA  AORTIC VALVE SIZE 21MM (N/A) TRANSESOPHAGEAL ECHOCARDIOGRAM (TEE) (N/A) APPLICATION OF CELL SAVER ENDOVEIN HARVEST OF GREATER SAPHENOUS VEIN (Right) CHEST EXPLORATION POD # 1, looks good this AM NEURO- intact CV- in SR but relatively slow in 60s- A paced for rate  Good hemodynamics, filling pressures low  Albumin, wean neo  ASA, statin, beta blocker RESP- IS for atelectasis RENAL- total boby volume overloaded, but intravascularly dry-   Albumin to expand IV volume then Lasix for diuresis  [place] [facility] GI- no nausea- advance diet ENDO- CBG controlled with insulin drip  Transition to levemir + SSI Anemia secondary to ABL- monitor SCD + enoxaparin for DVT prophylaxis Dc chest tubes Mobilize, cardiac rehab   LOS: 1 day    Keith Wagner 03/02/2021

## 2021-03-03 ENCOUNTER — Encounter (HOSPITAL_COMMUNITY)
Admission: RE | Disposition: A | Discharge status: Home Health | Payer: Self-pay | Source: Home / Self Care | Attending: Thoracic Surgery (Cardiothoracic Vascular Surgery)

## 2021-03-03 ENCOUNTER — Inpatient Hospital Stay (HOSPITAL_COMMUNITY): Payer: Medicare HMO

## 2021-03-03 DIAGNOSIS — Z952 Presence of prosthetic heart valve: Secondary | ICD-10-CM | POA: Diagnosis not present

## 2021-03-03 DIAGNOSIS — Z951 Presence of aortocoronary bypass graft: Secondary | ICD-10-CM

## 2021-03-03 DIAGNOSIS — J9601 Acute respiratory failure with hypoxia: Secondary | ICD-10-CM

## 2021-03-03 DIAGNOSIS — I495 Sick sinus syndrome: Secondary | ICD-10-CM

## 2021-03-03 HISTORY — PX: TEMPORARY PACEMAKER: CATH118268

## 2021-03-03 LAB — BASIC METABOLIC PANEL
Anion gap: 7 (ref 5–15)
Anion gap: 7 (ref 5–15)
BUN: 15 mg/dL (ref 8–23)
BUN: 17 mg/dL (ref 8–23)
CO2: 21 mmol/L — ABNORMAL LOW (ref 22–32)
CO2: 22 mmol/L (ref 22–32)
Calcium: 7.3 mg/dL — ABNORMAL LOW (ref 8.9–10.3)
Calcium: 7.5 mg/dL — ABNORMAL LOW (ref 8.9–10.3)
Chloride: 101 mmol/L (ref 98–111)
Chloride: 102 mmol/L (ref 98–111)
Creatinine, Ser: 1.61 mg/dL — ABNORMAL HIGH (ref 0.61–1.24)
Creatinine, Ser: 1.39 mg/dL — ABNORMAL HIGH (ref 0.61–1.24)
GFR, Estimated: 43 mL/min — ABNORMAL LOW (ref >60)
GFR, Estimated: 52 mL/min — ABNORMAL LOW (ref >60)
Glucose, Bld: 117 mg/dL — ABNORMAL HIGH (ref 70–99)
Glucose, Bld: 148 mg/dL — ABNORMAL HIGH (ref 70–99)
Potassium: 4.2 mmol/L (ref 3.5–5.1)
Potassium: 3.6 mmol/L (ref 3.5–5.1)
Sodium: 129 mmol/L — ABNORMAL LOW (ref 135–145)
Sodium: 131 mmol/L — ABNORMAL LOW (ref 135–145)

## 2021-03-03 LAB — POCT I-STAT 7, (LYTES, BLD GAS, ICA,H+H)
Acid-base deficit: 3 mmol/L — ABNORMAL HIGH (ref 0.0–2.0)
Acid-base deficit: 2 mmol/L (ref 0.0–2.0)
Bicarbonate: 22.7 mmol/L (ref 20.0–28.0)
Bicarbonate: 22.6 mmol/L (ref 20.0–28.0)
Calcium, Ion: 1.1 mmol/L — ABNORMAL LOW (ref 1.15–1.40)
Calcium, Ion: 1.11 mmol/L — ABNORMAL LOW (ref 1.15–1.40)
HCT: 27 % — ABNORMAL LOW (ref 39.0–52.0)
HCT: 25 % — ABNORMAL LOW (ref 39.0–52.0)
Hemoglobin: 9.2 g/dL — ABNORMAL LOW (ref 13.0–17.0)
Hemoglobin: 8.5 g/dL — ABNORMAL LOW (ref 13.0–17.0)
O2 Saturation: 59 %
O2 Saturation: 97 %
Patient temperature: 98.6
Patient temperature: 97.5
Potassium: 3.7 mmol/L (ref 3.5–5.1)
Potassium: 3.5 mmol/L (ref 3.5–5.1)
Sodium: 135 mmol/L (ref 135–145)
Sodium: 133 mmol/L — ABNORMAL LOW (ref 135–145)
TCO2: 24 mmol/L (ref 22–32)
TCO2: 24 mmol/L (ref 22–32)
pCO2 arterial: 40 mmHg (ref 32.0–48.0)
pCO2 arterial: 34.7 mmHg (ref 32.0–48.0)
pH, Arterial: 7.362 (ref 7.350–7.450)
pH, Arterial: 7.419 (ref 7.350–7.450)
pO2, Arterial: 32 mmHg — CL (ref 83.0–108.0)
pO2, Arterial: 87 mmHg (ref 83.0–108.0)

## 2021-03-03 LAB — COOXEMETRY PANEL
Carboxyhemoglobin: 1.1 % (ref 0.5–1.5)
Methemoglobin: 1 % (ref 0.0–1.5)
O2 Saturation: 52.3 %
Total hemoglobin: 9.2 g/dL — ABNORMAL LOW (ref 12.0–16.0)

## 2021-03-03 LAB — GLUCOSE, CAPILLARY
Glucose-Capillary: 116 mg/dL — ABNORMAL HIGH (ref 70–99)
Glucose-Capillary: 126 mg/dL — ABNORMAL HIGH (ref 70–99)
Glucose-Capillary: 153 mg/dL — ABNORMAL HIGH (ref 70–99)
Glucose-Capillary: 132 mg/dL — ABNORMAL HIGH (ref 70–99)
Glucose-Capillary: 128 mg/dL — ABNORMAL HIGH (ref 70–99)
Glucose-Capillary: 120 mg/dL — ABNORMAL HIGH (ref 70–99)

## 2021-03-03 LAB — ECHOCARDIOGRAM COMPLETE
AR max vel: 1.08 cm2
AV Area VTI: 0.98 cm2
AV Area mean vel: 1 cm2
AV Mean grad: 17.5 mmHg
AV Peak grad: 30.7 mmHg
Ao pk vel: 2.77 m/s
Area-P 1/2: 5.27 cm2
Height: 66 in
S' Lateral: 3.8 cm
Weight: 3033.53 oz

## 2021-03-03 LAB — CBC
HCT: 29 % — ABNORMAL LOW (ref 39.0–52.0)
Hemoglobin: 9.2 g/dL — ABNORMAL LOW (ref 13.0–17.0)
MCH: 25.3 pg — ABNORMAL LOW (ref 26.0–34.0)
MCHC: 31.7 g/dL (ref 30.0–36.0)
MCV: 79.9 fL — ABNORMAL LOW (ref 80.0–100.0)
Platelets: 190 10*3/uL (ref 150–400)
RBC: 3.63 MIL/uL — ABNORMAL LOW (ref 4.22–5.81)
RDW: 17.3 % — ABNORMAL HIGH (ref 11.5–15.5)
WBC: 16.8 10*3/uL — ABNORMAL HIGH (ref 4.0–10.5)
nRBC: 0 % (ref 0.0–0.2)

## 2021-03-03 LAB — BRAIN NATRIURETIC PEPTIDE: B Natriuretic Peptide: 1031.8 pg/mL — ABNORMAL HIGH (ref 0.0–100.0)

## 2021-03-03 SURGERY — TEMPORARY PACEMAKER
Anesthesia: LOCAL

## 2021-03-03 MED ORDER — POTASSIUM CHLORIDE CRYS ER 20 MEQ PO TBCR
20.0000 meq | EXTENDED_RELEASE_TABLET | ORAL | Status: DC
  Administered 2021-03-03: 20 meq via ORAL
  Filled 2021-03-03: qty 1

## 2021-03-03 MED ORDER — AMIODARONE HCL IN DEXTROSE 360-4.14 MG/200ML-% IV SOLN
60.0000 mg/h | INTRAVENOUS | Status: AC
  Administered 2021-03-03: 60 mg/h via INTRAVENOUS
  Filled 2021-03-03 (×2): qty 200

## 2021-03-03 MED ORDER — PERFLUTREN LIPID MICROSPHERE
1.0000 mL | INTRAVENOUS | Status: AC | PRN
Start: 2021-03-03 — End: 2021-03-03
  Administered 2021-03-03: 2 mL via INTRAVENOUS
  Filled 2021-03-03: qty 10

## 2021-03-03 MED ORDER — AMIODARONE HCL IN DEXTROSE 360-4.14 MG/200ML-% IV SOLN
30.0000 mg/h | INTRAVENOUS | Status: DC
  Administered 2021-03-04: 30 mg/h via INTRAVENOUS
  Filled 2021-03-03: qty 200

## 2021-03-03 MED ORDER — SODIUM CHLORIDE 0.9 % IV SOLN
INTRAVENOUS | Status: AC | PRN
  Administered 2021-03-03: 10 mL/h via INTRAVENOUS

## 2021-03-03 MED ORDER — INSULIN ASPART 100 UNIT/ML IJ SOLN
0.0000 [IU] | Freq: Three times a day (TID) | INTRAMUSCULAR | Status: DC
  Administered 2021-03-03: 12:00:00 3 [IU] via SUBCUTANEOUS
  Administered 2021-03-03 – 2021-03-04 (×3): 2 [IU] via SUBCUTANEOUS
  Administered 2021-03-08: 3 [IU] via SUBCUTANEOUS
  Administered 2021-03-09 – 2021-03-11 (×2): 2 [IU] via SUBCUTANEOUS

## 2021-03-03 MED ORDER — SODIUM CHLORIDE 0.9 % IV BOLUS
500.0000 mL | Freq: Once | INTRAVENOUS | Status: AC
  Administered 2021-03-03: 500 mL via INTRAVENOUS

## 2021-03-03 MED ORDER — INSULIN DETEMIR 100 UNIT/ML ~~LOC~~ SOLN
15.0000 [IU] | Freq: Every day | SUBCUTANEOUS | Status: DC
  Administered 2021-03-03 – 2021-03-06 (×4): 15 [IU] via SUBCUTANEOUS
  Filled 2021-03-03 (×5): qty 0.15

## 2021-03-03 MED ORDER — ALBUMIN HUMAN 5 % IV SOLN
12.5000 g | Freq: Once | INTRAVENOUS | Status: DC
  Filled 2021-03-03: qty 250

## 2021-03-03 MED ORDER — ALBUTEROL SULFATE (2.5 MG/3ML) 0.083% IN NEBU
2.5000 mg | INHALATION_SOLUTION | Freq: Four times a day (QID) | RESPIRATORY_TRACT | Status: DC | PRN
  Administered 2021-03-03: 2.5 mg via RESPIRATORY_TRACT
  Filled 2021-03-03: qty 3

## 2021-03-03 MED ORDER — FUROSEMIDE 10 MG/ML IJ SOLN
40.0000 mg | Freq: Once | INTRAMUSCULAR | Status: AC
  Administered 2021-03-03: 40 mg via INTRAVENOUS
  Filled 2021-03-03: qty 4

## 2021-03-03 MED ORDER — FUROSEMIDE 10 MG/ML IJ SOLN
40.0000 mg | Freq: Three times a day (TID) | INTRAMUSCULAR | Status: DC
  Administered 2021-03-03 – 2021-03-04 (×2): 40 mg via INTRAVENOUS
  Filled 2021-03-03 (×2): qty 4

## 2021-03-03 MED ORDER — POTASSIUM CHLORIDE ER 10 MEQ PO TBCR
10.0000 meq | EXTENDED_RELEASE_TABLET | Freq: Three times a day (TID) | ORAL | Status: DC
  Administered 2021-03-03: 10 meq via ORAL
  Filled 2021-03-03 (×3): qty 1

## 2021-03-03 MED ORDER — ALBUMIN HUMAN 5 % IV SOLN
12.5000 g | Freq: Once | INTRAVENOUS | Status: AC
  Administered 2021-03-03: 12.5 g via INTRAVENOUS

## 2021-03-03 MED ORDER — POTASSIUM CHLORIDE 10 MEQ/50ML IV SOLN
10.0000 meq | INTRAVENOUS | Status: AC
  Administered 2021-03-03 (×4): 10 meq via INTRAVENOUS
  Filled 2021-03-03 (×3): qty 50

## 2021-03-03 MED ORDER — SODIUM BICARBONATE 8.4 % IV SOLN
100.0000 meq | Freq: Once | INTRAVENOUS | Status: AC
  Administered 2021-03-03: 100 meq via INTRAVENOUS

## 2021-03-03 MED ORDER — DOPAMINE-DEXTROSE 3.2-5 MG/ML-% IV SOLN
2.5000 ug/kg/min | INTRAVENOUS | Status: DC
  Administered 2021-03-03: 2.5 ug/kg/min via INTRAVENOUS
  Administered 2021-03-04: 5 ug/kg/min via INTRAVENOUS
  Filled 2021-03-03 (×2): qty 250

## 2021-03-03 MED ORDER — LIDOCAINE HCL (PF) 1 % IJ SOLN
INTRAMUSCULAR | Status: DC | PRN
  Administered 2021-03-03: 7 mL

## 2021-03-03 MED ORDER — ENOXAPARIN SODIUM 30 MG/0.3ML IJ SOSY
30.0000 mg | PREFILLED_SYRINGE | Freq: Every day | INTRAMUSCULAR | Status: DC
  Administered 2021-03-03 – 2021-03-05 (×3): 30 mg via SUBCUTANEOUS
  Filled 2021-03-03 (×3): qty 0.3

## 2021-03-03 MED ORDER — ATORVASTATIN CALCIUM 40 MG PO TABS
40.0000 mg | ORAL_TABLET | Freq: Every day | ORAL | Status: DC
  Administered 2021-03-04 – 2021-03-11 (×8): 40 mg via ORAL
  Filled 2021-03-03 (×9): qty 1

## 2021-03-03 MED ORDER — LIDOCAINE HCL (PF) 1 % IJ SOLN
INTRAMUSCULAR | Status: AC
  Filled 2021-03-03: qty 30

## 2021-03-03 MED ORDER — AMIODARONE LOAD VIA INFUSION
150.0000 mg | Freq: Once | INTRAVENOUS | Status: AC
  Administered 2021-03-03: 150 mg via INTRAVENOUS
  Filled 2021-03-03: qty 83.34

## 2021-03-03 MED ORDER — ALBUMIN HUMAN 5 % IV SOLN
12.5000 g | Freq: Once | INTRAVENOUS | Status: AC
  Administered 2021-03-03: 12.5 g via INTRAVENOUS
  Filled 2021-03-03: qty 250

## 2021-03-03 SURGICAL SUPPLY — 11 items
CABLE ADAPT PACING TEMP 12FT (ADAPTER) ×2 IMPLANT
CATH TEMPO TEMP PACE LEAD (CATHETERS) IMPLANT
CATHETER TEMPO TEMP PACE LEAD (CATHETERS) ×3
KIT HEART LEFT (KITS) ×3 IMPLANT
PACK CARDIAC CATHETERIZATION (CUSTOM PROCEDURE TRAY) ×3 IMPLANT
SHEATH PINNACLE 5F 10CM (SHEATH) IMPLANT
SHEATH PINNACLE 6F 10CM (SHEATH) ×2 IMPLANT
SLEEVE REPOSITIONING LENGTH 30 (MISCELLANEOUS) ×2 IMPLANT
TRANSDUCER W/STOPCOCK (MISCELLANEOUS) ×1 IMPLANT
TUBING CIL FLEX 10 FLL-RA (TUBING) ×1 IMPLANT
WIRE MICRO SET SILHO 5FR 7 (SHEATH) ×2 IMPLANT

## 2021-03-03 NOTE — Consult Note (Signed)
CARDIOLOGY CONSULT NOTE  Patient ID: REAL CONA MRN: [id number] DOB/AGE: May 26, 1940 80 y.o.  Admit date: 03/01/2021 Referring Physician  Merilynn Finland, MD Primary Physician:  Aletha Halim., PA-C Reason for Consultation  Heart block and heart failure  Patient ID: DWAN HEMMELGARN, male    DOB: 09/04/1940, 80 y.o.   MRN: [id number]  No chief complaint on file.  HPI:    NOLAWI KANADY  is a 80 y.o. Caucasian male patient with aortic stenosis, hypertension, type 2 diabetes mellitus, hyperlipidemia who underwent coronary angiography on 02/06/2021 for evaluation of dyspnea and aortic stenosis, found to have calcific distal left main 45% stenosis and mid LAD 80% and ostial circumflex 70% stenosis for which he underwent CABG x2 with LIMA to LAD and SVG to OM and aortic valve replacement with a 21 mm Edwards InspirEase Resilia bioprosthetic valve on 03/01/2021.  He has been progressing well until yesterday afternoon started developing episodes of heart block, worsening renal function and I was consulted for management of his arrhythmia and heart block by Dr. Roxan Hockey.  Patient was doing well until yesterday morning, then started developing various arrhythmias including heart block, PACs.  Since then patient developed hypotension requiring norepinephrine yesterday and also creatinine creeped up.  This morning blood pressure continues to be very labile and heart rate has also been very labile.  Patient denies chest pain or shortness of breath but states he is very tired.  He has not had any bowel movements or to has not passed gas since surgery.  Past Medical History:  Diagnosis Date   Arthritis    Asthma    uses inhaler PRN   Bilateral carotid bruits 06/12/2018   Cataract    Diabetes mellitus without complication ([facility])    per pt "pre-diabetic"- off Metformin- diet controlled    GERD (gastroesophageal reflux disease)    Gout    Heart murmur    Hypercholesteremia 06/12/2018    Hyperlipidemia    Hypertension    Mild aortic stenosis 06/12/2018   Past Surgical History:  Procedure Laterality Date   AORTIC VALVE REPLACEMENT N/A 03/01/2021   Procedure: AORTIC VALVE REPLACEMENT (AVR) WITH INSPIRIS RESILIA  AORTIC VALVE SIZE 21MM;  Surgeon: [name], MD;  Location: [place];  Service: Open Heart Surgery;  Laterality: N/A;   CARDIAC CATHETERIZATION     CATARACT EXTRACTION Left    CHEST EXPLORATION  03/01/2021   Procedure: CHEST EXPLORATION;  Surgeon: [name], MD;  Location: [facility] OR;  Service: Open Heart Surgery;;   COLONOSCOPY     CORONARY ARTERY BYPASS GRAFT N/A 03/01/2021   Procedure: CORONARY ARTERY BYPASS GRAFTING (CABG) X 2 USING LEFT INTERNAL MAMMARY ARTERY AND RIGHT ENDOSCOPIC GREATER SAPHENOUS VEIN CONDUITS;  Surgeon: [name], MD;  Location: [place];  Service: Open Heart Surgery;  Laterality: N/A;   ENDOVEIN HARVEST OF GREATER SAPHENOUS VEIN Right 03/01/2021   Procedure: ENDOVEIN HARVEST OF GREATER SAPHENOUS VEIN;  Surgeon: [name], MD;  Location: [place];  Service: Open Heart Surgery;  Laterality: Right;   EYE SURGERY     POLYPECTOMY     RIGHT HEART CATH AND CORONARY ANGIOGRAPHY N/A 02/06/2021   Procedure: RIGHT HEART CATH AND CORONARY ANGIOGRAPHY;  Surgeon: Nigel Mormon, MD;  Location: [place] [facility];  Service: Cardiovascular;  Laterality: N/A;   TEE WITHOUT CARDIOVERSION N/A 03/01/2021   Procedure: TRANSESOPHAGEAL ECHOCARDIOGRAM (TEE);  Surgeon: [name], MD;  Location: [place];  Service: Open Heart Surgery;  Laterality: N/A;   THORACIC AORTOGRAM N/A 02/06/2021   Procedure: THORACIC AORTOGRAM;  Surgeon: Nigel Mormon, MD;  Location: [place] [facility];  Service: Cardiovascular;  Laterality: N/A;   Social History   Tobacco Use   Smoking status: Former    Types: Cigars   Smokeless tobacco: Never  Substance Use Topics   Alcohol use: No    Family History  Problem Relation Age of Onset    Colon cancer Neg Hx    Esophageal cancer Neg Hx    Stomach cancer Neg Hx    Rectal cancer Neg Hx    Colon polyps Neg Hx     Marital Status: Married  ROS  Review of Systems  Constitutional: Positive for malaise/fatigue.  Cardiovascular:  Negative for chest pain, dyspnea on exertion and leg swelling.  Gastrointestinal:  Positive for bloating. Negative for abdominal pain and melena.  Objective   Vitals with BMI 03/03/2021 03/03/2021 03/03/2021  Height - - -  Weight - - -  BMI - - -  Systolic 95 536 468  Diastolic 56 82 77  Pulse 86 89 89    Blood pressure (!) 95/56, pulse 86, temperature 98.6 F (37 C), temperature source Oral, resp. rate (!) 21, height $RemoveBe'5\' 6"'XKcruApxb$  (1.676 m), weight 86 kg, SpO2 98 %.    Physical Exam Constitutional:      General: He is not in acute distress.    Appearance: He is obese.  Eyes:     Extraocular Movements: Extraocular movements intact.  Neck:     Vascular: No carotid bruit or JVD.     Comments: Difficult to make out JVD in view of presence of central lines. Cardiovascular:     Rate and Rhythm: Normal rate and regular rhythm.     Pulses: Intact distal pulses.          Dorsalis pedis pulses are 1+ on the right side and 1+ on the left side.       Posterior tibial pulses are 1+ on the right side and 1+ on the left side.     Heart sounds: Murmur heard.  Harsh crescendo systolic murmur is present with a grade of 2/6 at the upper right sternal border.    No gallop.  Pulmonary:     Effort: Pulmonary effort is normal.     Breath sounds: Normal breath sounds.  Abdominal:     General: Bowel sounds are absent. There is distension.     Palpations: Abdomen is soft.     Tenderness: There is no abdominal tenderness.  Musculoskeletal:     Right lower leg: No edema.     Left lower leg: No edema.  Skin:    Capillary Refill: Capillary refill takes less than 2 seconds.  Neurological:     General: No focal deficit present.     Mental Status: He is alert and  oriented to person, place, and time. Mental status is at baseline.   Laboratory examination:   Recent Labs    03/02/21 0326 03/02/21 1643 03/03/21 0500  NA 135 128* 129*  K 4.4 4.1 4.2  CL 104 101 101  CO2 22 19* 21*  GLUCOSE 118* 139* 117*  BUN $Re'10 12 15  'gsC$ CREATININE 1.05 1.54* 1.61*  CALCIUM 7.8* 7.7* 7.3*  GFRNONAA >60 46* 43*   estimated creatinine clearance is 38.3 mL/min (A) (by C-G formula based on SCr of 1.61 mg/dL (H)).  CMP Latest Ref Rng & Units 03/03/2021 03/02/2021 03/02/2021  Glucose 70 -  99 mg/dL 117(H) 139(H) 118(H)  BUN 8 - 23 mg/dL $Remove'15 12 10  'VvgkQhd$ Creatinine 0.61 - 1.24 mg/dL 1.61(H) 1.54(H) 1.05  Sodium 135 - 145 mmol/L 129(L) 128(L) 135  Potassium 3.5 - 5.1 mmol/L 4.2 4.1 4.4  Chloride 98 - 111 mmol/L 101 101 104  CO2 22 - 32 mmol/L 21(L) 19(L) 22  Calcium 8.9 - 10.3 mg/dL 7.3(L) 7.7(L) 7.8(L)  Total Protein 6.5 - 8.1 g/dL - - -  Total Bilirubin 0.3 - 1.2 mg/dL - - -  Alkaline Phos 38 - 126 U/L - - -  AST 15 - 41 U/L - - -  ALT 0 - 44 U/L - - -   CBC Latest Ref Rng & Units 03/03/2021 03/02/2021 03/02/2021  WBC 4.0 - 10.5 K/uL 16.8(H) 16.3(H) 14.1(H)  Hemoglobin 13.0 - 17.0 g/dL 9.2(L) 9.0(L) 9.4(L)  Hematocrit 39.0 - 52.0 % 29.0(L) 27.9(L) 29.3(L)  Platelets 150 - 400 K/uL 190 173 175   Lipid Panel No results for input(s): CHOL, TRIG, LDLCALC, VLDL, HDL, CHOLHDL, LDLDIRECT in the last 8760 hours.  HEMOGLOBIN A1C Lab Results  Component Value Date   HGBA1C 6.3 (H) 02/27/2021   MPG 134.11 02/27/2021   TSH No results for input(s): TSH in the last 8760 hours. BNP (last 3 results) No results for input(s): BNP in the last 8760 hours.  Cardiac Panel (last 3 results) No results for input(s): CKTOTAL, CKMB, TROPONINIHS, RELINDX in the last 72 hours.   Medications and allergies  No Known Allergies   Current Meds  Medication Sig   acetaminophen (TYLENOL) 500 MG tablet Take 1,000 mg by mouth every 6 (six) hours as needed for moderate pain.   aspirin 81 MG  tablet Take 81 mg by mouth daily.   atorvastatin (LIPITOR) 20 MG tablet Take 20 mg by mouth daily.   calcium carbonate (TUMS - DOSED IN MG ELEMENTAL CALCIUM) 500 MG chewable tablet Chew 500-1,000 mg by mouth daily as needed for indigestion or heartburn.   clobetasol cream (TEMOVATE) 0.31 % Apply 1 application topically 2 (two) times daily as needed (eczema).   dapagliflozin propanediol (FARXIGA) 10 MG TABS tablet Take 1 tablet (10 mg total) by mouth daily before breakfast.   ENTRESTO 97-103 MG Take 1 tablet by mouth twice daily   Guaifenesin (MUCINEX MAXIMUM STRENGTH) 1200 MG TB12 Take 1,200 mg by mouth daily.   Lutein 6 MG TABS Take 6 mg by mouth daily.   magnesium oxide (MAG-OX) 400 MG tablet Take 1 tablet (400 mg total) by mouth daily.   metoprolol succinate (TOPROL-XL) 25 MG 24 hr tablet TAKE 1 TABLET BY MOUTH ONCE DAILY. HOLD IF TOP BLOOD PRESSURE NUMBER IS LESS THAN 100 MMHG OR PILSE LES THAN 60 BPM   Multiple Vitamins-Minerals (MULTIVITAMIN PO) Take 1 tablet by mouth daily.   Omega-3 Fatty Acids (FISH OIL) 1000 MG CAPS Take 1,000 mg by mouth daily.   OVER THE COUNTER MEDICATION Take 1 tablet by mouth daily. Super Beta Prostate Advanced   spironolactone (ALDACTONE) 25 MG tablet TAKE 1 TABLET BY MOUTH ONCE DAILY AT  10  PM   triamcinolone cream (KENALOG) 0.1 % Apply 1 application topically 2 (two) times daily as needed (eczema).    Scheduled Meds:  acetaminophen  1,000 mg Oral Q6H   Or   acetaminophen (TYLENOL) oral liquid 160 mg/5 mL  1,000 mg Per Tube Q6H   aspirin EC  325 mg Oral Daily   Or   aspirin  324 mg Per Tube Daily  atorvastatin  20 mg Oral Daily   bisacodyl  10 mg Oral Daily   Or   bisacodyl  10 mg Rectal Daily   chlorhexidine gluconate (MEDLINE KIT)  15 mL Mouth Rinse BID   Chlorhexidine Gluconate Cloth  6 each Topical Daily   docusate sodium  200 mg Oral Daily   enoxaparin (LOVENOX) injection  30 mg Subcutaneous QHS   insulin aspart  0-15 Units Subcutaneous TID WC    insulin detemir  15 Units Subcutaneous BID   magnesium oxide  400 mg Oral Daily   mouth rinse  15 mL Mouth Rinse 10 times per day   mupirocin ointment  1 application Nasal BID   pantoprazole  40 mg Oral Daily   sodium chloride flush  10-40 mL Intracatheter Q12H   sodium chloride flush  3 mL Intravenous Q12H   Continuous Infusions:  sodium chloride Stopped (03/02/21 0801)   sodium chloride     sodium chloride 20 mL/hr at 03/01/21 1512   lactated ringers     lactated ringers 10 mL/hr at 03/01/21 0747   lactated ringers Stopped (03/01/21 1737)   lactated ringers Stopped (03/02/21 2150)   phenylephrine (NEO-SYNEPHRINE) Adult infusion 40 mcg/min (03/03/21 0800)   PRN Meds:.sodium chloride, calcium carbonate, lactated ringers, metoprolol tartrate, midazolam, morphine injection, ondansetron (ZOFRAN) IV, oxyCODONE, sodium chloride flush, sodium chloride flush, traMADol   I/O last 3 completed shifts: In: 4763.2 [P.O.:820; I.V.:2435; IV Piggyback:1508.2] Out: 1651 [Urine:1456; Chest Tube:195] Total I/O In: 296.4 [P.O.:240; I.V.:56.4] Out: 40 [Urine:20]    Radiology:   [facility] Chest Port 1 View  Result Date: 03/02/2021 CLINICAL DATA:  Chest tube present s/p CABG / AVR EXAM: PORTABLE CHEST - 1 VIEW COMPARISON:  the previous day's study FINDINGS: Patient has been extubated and the gastric tube removed. Stable left chest tube and mediastinal drain. No pneumothorax. Advancement of right IJ Swan-Ganz to the distal right pulmonary artery. Lower lung volumes with some increase in left infrahilar consolidation/atelectasis. Heart size within normal limits. Aortic arch is obscured as before. CABG markers. Previous AVR. Blunting of lateral costophrenic angles suggesting small effusions. Sternotomy wires. IMPRESSION: 1. Extubation and gastric tube removal, with lower lung volumes and some increase in left infrahilar consolidation/atelectasis. 2. Stable left chest tube, no pneumothorax 3. Swing Ganz  advancement to the distal right pulmonary artery 4. Probable small pleural effusions. Electronically Signed   By: Lucrezia Europe M.D.   On: 03/02/2021 08:52   DG Chest Port 1 View  Result Date: 03/01/2021 CLINICAL DATA:  Status post CABG and aortic valve replacement. EXAM: PORTABLE CHEST 1 VIEW COMPARISON:  02/27/2021 FINDINGS: Sequelae of interval CABG and aortic valve replacement are identified. An endotracheal tube terminates at the clavicular heads, well above the carina. An enteric tube courses into the abdomen with tip not imaged. A right jugular Swan-Ganz catheter terminates over the pulmonary outflow tract. A mediastinal drain and left chest tube are present. The cardiac silhouette is accentuated by portable AP technique. The thoracic aorta is tortuous with atherosclerotic calcification. Mild interstitial opacity is present in the left lung base. No sizable pleural effusion or pneumothorax is identified. IMPRESSION: Support devices as above.  Mild left basilar atelectasis. Electronically Signed   By: Logan Bores M.D.   On: 03/01/2021 15:03    Cardiac Studies:   Echocardiogram 12/07/2020: Moderately depressed LV systolic function with visual EF 30-35%. Left ventricle cavity is dilated. Moderate concentric hypertrophy of the left ventricle. Hypokinetic global wall motion. Doppler evidence of grade II (pseudonormal)  diastolic dysfunction, elevated LAP.  Left atrial cavity is mildly dilated. Mild aortic stenosis (peak velocity 2.41m/s, Peak gradient 22mmHg, Mean gradient 67mmHg, AVA 0.9cm2, DI 0.30) severity of AS may be under appreciated due to low flow low gradient AS.  Moderate (Grade III) aortic regurgitation. Moderate (Grade II) mitral regurgitation. Mild tricuspid regurgitation. Mild pulmonary hypertension. RVSP measures 44 mmHg. IVC is normal with a respiratory response of <50%. Compared to study 05/25/2019: LVEF 50-55% is now 30-35%, G1DD is now G2DD with elevated LAP, PHTN is new., otherwise  no significant change.  Left Heart Catheterization 02/06/2021: LM: Distal calcific 75% stenosis LAD: Mid 80% stenosis LCX: large vessel. Ostial 70% stenosis.  RCA: Dominant, but small vessel. No significant disease seen on well opacified non-selective angiogram.   Normal PCW Unable to cross aortic valve due to severe aorta tortuosity.  1. Left ventricular ejection fraction, by estimation, is 50 to 55%. Left ventricular ejection fraction by PLAX is 45 %. The left ventricle has low normal function. The left ventricle has no regional wall motion abnormalities. There is mild left  ventricular hypertrophy. Left ventricular diastolic parameters are consistent with Grade II diastolic dysfunction (pseudonormalization). Elevated left ventricular end-diastolic pressure.  2. Right ventricular systolic function is normal. The right ventricular size is normal.  3. Left atrial size was moderately dilated.  4. The mitral valve was not well visualized. Trivial mitral valve regurgitation.  5. The aortic valve was not well visualized. Aortic valve regurgitation is not visualized. Mild aortic valve stenosis. There is a 21 mm Edwards Edwards InspirEase Resilia valve present in the aortic position. Aortic valve area, by VTI measures 0.98 cm.  Aortic valve mean gradient measures 17.5 mmHg. Aortic valve Vmax measures 2.77 m/s.  6. The inferior vena cava is dilated in size with <50% respiratory variability, suggesting right atrial pressure of 15 mmHg.  CABG x2 on 03/01/2021: LIMA to LAD and SVG to OM and aortic valve replacement with a 21 mm Edwards InspirEase Resilia bioprosthetic valve.  EKG:  EKG 03/03/2021: Episodes of sinus arrest with junctional escape, PACs.  Nonspecific T abnormality.  EKG 03/02/2021: Normal sinus rhythm at rate of 76 bpm, normal axis, nonspecific T abnormality.  Assessment   1.  Sinus node dysfunction 2.  Hypotension secondary to sinus node dysfunction 3.  Nonischemic  cardiomyopathy, left main disease SP CABG and aortic valve replacement for moderate aortic stenosis, symptomatic on 03/01/2021. 4.  Acute renal failure stage IIIa secondary to hypotension and acute tubular necrosis. 5.  Hyponatremia due to volume excess and acute diastolic heart failure  Recommendations:   JAHKAI YANDELL is a 80 y.o. Caucasian male patient with aortic stenosis, hypertension, type 2 diabetes mellitus, hyperlipidemia who underwent coronary angiography on 02/06/2021 for evaluation of dyspnea and aortic stenosis, found to have calcific distal left main 45% stenosis and mid LAD 80% and ostial circumflex 70% stenosis for which he underwent CABG x2 with LIMA to LAD and SVG to OM and aortic valve replacement with a 21 mm Edwards InspirEase Resilia bioprosthetic valve on 03/01/2021.  He has been progressing well until yesterday afternoon started developing episodes of heart block, worsening renal function and I was consulted for management of his arrhythmia and heart block by Dr. Roxan Hockey.  I evaluated the patient at the bedside with Dr. Roxan Hockey.  Patient continues to have very labile blood pressure, and tenuous V pacing through pericardial leads.  Best to proceed with temporary transvenous pacemaker implantation for hemodynamic stabilization.  Continue IV dobutamine at 5-10  mcg/kg/min and will try to wean him off of norepinephrine.  Bedside echocardiogram performed, read, EF appears to be low normal at least.  Aortic valve appears to be in good position although not well visualized, increased gradient noted, probably postop and will need repeat echocardiogram at a later date when stable.  He has had decreased urine output and volume excess, but with dopamine and stabilization of blood pressure, his urine output is picked up.  We will continue to follow his creatinine and sodium levels.  Met his wife and updated her regarding need for temporary transvenous pacemaker, pneumothorax,  bleeding complications, perforation discussed.  Critical care provided 75 minutes at the bedside in evaluation of heart block, hypotension and active management of precardiac shock.    Adrian Prows, MD, [facility] 03/03/2021, 8:43 AM to 12:25 PM  Office: [phone]

## 2021-03-03 NOTE — Progress Notes (Signed)
2 Days Post-Op Procedure(s) (LRB): CORONARY ARTERY BYPASS GRAFTING (CABG) X 2 USING LEFT INTERNAL MAMMARY ARTERY AND RIGHT ENDOSCOPIC GREATER SAPHENOUS VEIN CONDUITS (N/A) AORTIC VALVE REPLACEMENT (AVR) WITH INSPIRIS RESILIA  AORTIC VALVE SIZE 21MM (N/A) TRANSESOPHAGEAL ECHOCARDIOGRAM (TEE) (N/A) APPLICATION OF CELL SAVER ENDOVEIN HARVEST OF GREATER SAPHENOUS VEIN (Right) CHEST EXPLORATION Subjective: Denies pain  Objective: Vital signs in last 24 hours: Temp:  [97.5 F (36.4 C)-98.6 F (37 C)] 98.6 F (37 C) (12/10 0400) Pulse Rate:  [32-89] 88 (12/10 0600) Cardiac Rhythm: A-V Sequential paced (12/10 0600) Resp:  [7-26] 17 (12/10 0600) BP: (84-154)/(37-98) 100/84 (12/10 0600) SpO2:  [90 %-100 %] 99 % (12/10 0600) Arterial Line BP: (74-120)/(51-99) 107/74 (12/10 0600) Weight:  [86 kg] 86 kg (12/10 0500)  Hemodynamic parameters for last 24 hours: CVP:  [9 mmHg-22 mmHg] 12 mmHg  Intake/Output from previous day: 12/09 0701 - 12/10 0700 In: 3320.1 [P.O.:720; I.V.:1564.7; IV Piggyback:1035.4] Out: 981 [Urine:871; Chest Tube:110] Intake/Output this shift: Total I/O In: 56.4 [I.V.:56.4] Out: 20 [Urine:20]  General appearance: alert, cooperative, and no distress Neurologic: intact Heart: regular rate and rhythm and paced Lungs: diminished breath sounds bibasilar Abdomen: mildly distended, nontender  Lab Results: Recent Labs    03/02/21 1643 03/03/21 0500  WBC 16.3* 16.8*  HGB 9.0* 9.2*  HCT 27.9* 29.0*  PLT 173 190   BMET:  Recent Labs    03/02/21 1643 03/03/21 0500  NA 128* 129*  K 4.1 4.2  CL 101 101  CO2 19* 21*  GLUCOSE 139* 117*  BUN 12 15  CREATININE 1.54* 1.61*  CALCIUM 7.7* 7.3*    PT/INR:  Recent Labs    03/01/21 1436  LABPROT 17.0*  INR 1.4*   ABG    Component Value Date/Time   PHART 7.364 03/01/2021 1931   HCO3 20.7 03/01/2021 1931   TCO2 22 03/01/2021 1931   ACIDBASEDEF 4.0 (H) 03/01/2021 1931   O2SAT 95.0 03/01/2021 1931   CBG  (last 3)  Recent Labs    03/02/21 2346 03/03/21 0356 03/03/21 0809  GLUCAP 123* 116* 126*    Assessment/Plan: S/P Procedure(s) (LRB): CORONARY ARTERY BYPASS GRAFTING (CABG) X 2 USING LEFT INTERNAL MAMMARY ARTERY AND RIGHT ENDOSCOPIC GREATER SAPHENOUS VEIN CONDUITS (N/A) AORTIC VALVE REPLACEMENT (AVR) WITH INSPIRIS RESILIA  AORTIC VALVE SIZE 21MM (N/A) TRANSESOPHAGEAL ECHOCARDIOGRAM (TEE) (N/A) APPLICATION OF CELL SAVER ENDOVEIN HARVEST OF GREATER SAPHENOUS VEIN (Right) CHEST EXPLORATION POD # 2 NEURO - intact CV- went into [facility] last night. This AM, won't A pace, Slow escape in high 30- low 40s with P wave. Also increased neo dose overnight, now weaning after volume but overall concerning picture- will get Stat Echo this Am Beta blockers on hold, will stop amiodarone Dr. Einar Gip to see as well  RESP- sats Ok on 4L, CXR shows mild basilar atelectasis  RENAL- creatinine up to 1.6 and UO relatively low  Will start low dose dopamine  ENDO- CBG well controlled  Decrease levemir to 15 units daily, change SSI to [facility] and HS  GI- tolerating PO  Anemia secondary to ABL- stable, mild, follow  SCD + enoxaparin for DVT prophylaxis   LOS: 2 days    Keith Wagner 03/03/2021

## 2021-03-03 NOTE — Consult Note (Signed)
NAME:  Keith Wagner, MRN:  [id number], DOB:  [birth year], LOS: 2 ADMISSION DATE:  03/01/2021, CONSULTATION DATE:  03/03/21 REFERRING MD:  [name], MD, CHIEF COMPLAINT:  respiratory failure   History of Present Illness:  Keith Wagner is a 80 year old gentleman with a past medical history of coronary disease and aortic stenosis.  He presented on 12/8 for an elective CABG and aortic valve replacement.  During the surgery he had some episodes of bigeminy intermittently required pacing.  He was ultimately started on amiodarone, and was paced.  Additionally there is a small perforation in the RV wall adjacent to mediastinal chest tube.  This was closed with sutures.  Patient was initially hypotensive but then stabilized after this was closed.  His postoperative course was uncomplicated.  He was extubated.  The evening of 12/9 he developed some episodes of intermittent complete heart block and his pacer wires failed to capture.  This morning he was contacted by Dr. Einar Gip when the patient was found to have progressive hypoxemia on high flow oxygen.  He is in complete heart block.  He was placed on dopamine with somewhat improved capture.  PCCM consulted to assist with respiratory failure and concern for impending intubation. Patient reports breathing more comfortable after transitioning from high flow to bipap. Chest tubes postoperatively have been pulled.  Pertinent  Medical History  Coronary artery disease HTN HLD DM2  Significant Hospital Events: Including procedures, antibiotic start and stop dates in addition to other pertinent events   12/8 CABG x2, AVR with Dr. Roxan Hockey   Interim History / Subjective:  As above  Objective   Blood pressure (!) 95/56, pulse 86, temperature 98.3 F (36.8 C), temperature source Oral, resp. rate (!) 21, height 5\' 6"  (1.676 m), weight 86 kg, SpO2 98 %. CVP:  [10 mmHg-15 mmHg] 12 mmHg      Intake/Output Summary (Last 24 hours) at  03/03/2021 1249 Last data filed at 03/03/2021 1200 Gross per 24 hour  Intake 2374.02 ml  Output 720 ml  Net 1654.02 ml   Filed Weights   03/01/21 0611 03/02/21 0500 03/03/21 0500  Weight: 74.8 kg 83.5 kg 86 kg    Examination: General: acutely ill appearing on bipap HENT: dry mm Lungs: dimished bilaterally, crackles bibasilar, increased wob Cardiovascular: HR in 70s-80s, intermittently capturing  Abdomen: soft, nontender, nondistended Extremities: no edema Neuro: AOx4 GU: foley in place  Labs/imaging reviewed: Echo 03/03/21 LVEF low normal 37-34% Grade 2 diastolic dysfunciton Normal RV  Chest xray 12/10 shows pulmonary edema, small left apical ptx ABG shows hypoxemic respiratory failure - PO2 32  Resolved Hospital Problem list     Assessment & Plan:  Keith Wagner is a 80 y.o. man who is POD 2 from CABG and presents with:  Acute hypoxemic and hypercapnic respiratory failure Acute pulmonary edema Will monitor closely for intubation, currently looking a little better on bipap Will need aggressive diuresis following restoration of NRS  Complete Heart Block POD #2 CABG x2 and AVR Plan is to go to cath lab now for temporary venous pacer wire placement  PCCM will follow.   Best Practice (right click and "Reselect all SmartList Selections" daily)   Diet/type: NPO DVT prophylaxis: LMWH GI prophylaxis: N/A Lines: Central line and Arterial Line Foley:  N/A Code Status:  full code Last date of multidisciplinary goals of care discussion [per primary, i've updated wife at bedside regarding plan of care]   Labs   CBC: Recent Labs  Lab  03/01/21 1436 03/01/21 1444 03/01/21 2008 03/02/21 0326 03/02/21 1643 03/03/21 0500 03/03/21 1237  WBC 21.9*  --  14.4* 14.1* 16.3* 16.8*  --   HGB 11.3*   < > 9.3* 9.4* 9.0* 9.2* 9.2*  HCT 35.4*   < > 29.7* 29.3* 27.9* 29.0* 27.0*  MCV 78.7*  --  78.6* 78.6* 77.9* 79.9*  --   PLT 210  --  149* 175 173 190  --    < > =  values in this interval not displayed.    Basic Metabolic Panel: Recent Labs  Lab 02/27/21 1600 03/01/21 0810 03/01/21 1239 03/01/21 1318 03/01/21 2008 03/02/21 0326 03/02/21 1643 03/03/21 0500 03/03/21 1237  NA 137   < > 137   < > 133* 135 128* 129* 135  K 4.3   < > 4.5   < > 4.6 4.4 4.1 4.2 3.7  CL 108   < > 106  --  107 104 101 101  --   CO2 18*  --   --   --  20* 22 19* 21*  --   GLUCOSE 100*   < > 179*  --  178* 118* 139* 117*  --   BUN 17   < > 13  --  11 10 12 15   --   CREATININE 1.09   < > 0.80  --  0.87 1.05 1.54* 1.61*  --   CALCIUM 8.9  --   --   --  8.0* 7.8* 7.7* 7.3*  --   MG  --   --   --   --  3.3* 2.8* 2.5*  --   --    < > = values in this interval not displayed.   GFR: Estimated Creatinine Clearance: 38.3 mL/min (A) (by C-G formula based on SCr of 1.61 mg/dL (H)). Recent Labs  Lab 03/01/21 2008 03/02/21 0326 03/02/21 1643 03/03/21 0500  WBC 14.4* 14.1* 16.3* 16.8*    Liver Function Tests: Recent Labs  Lab 02/27/21 1600  AST 22  ALT 18  ALKPHOS 59  BILITOT 0.7  PROT 6.8  ALBUMIN 3.7   No results for input(s): LIPASE, AMYLASE in the last 168 hours. No results for input(s): AMMONIA in the last 168 hours.  ABG    Component Value Date/Time   PHART 7.362 03/03/2021 1237   PCO2ART 40.0 03/03/2021 1237   PO2ART 32 (LL) 03/03/2021 1237   HCO3 22.7 03/03/2021 1237   TCO2 24 03/03/2021 1237   ACIDBASEDEF 3.0 (H) 03/03/2021 1237   O2SAT 59.0 03/03/2021 1237     Coagulation Profile: Recent Labs  Lab 02/27/21 1600 03/01/21 1436  INR 1.1 1.4*    Cardiac Enzymes: No results for input(s): CKTOTAL, CKMB, CKMBINDEX, TROPONINI in the last 168 hours.  HbA1C: Hgb A1c MFr Bld  Date/Time Value Ref Range Status  02/27/2021 04:00 PM 6.3 (H) 4.8 - 5.6 % Final    Comment:    (NOTE) Pre diabetes:          5.7%-6.4%  Diabetes:              >6.4%  Glycemic control for   <7.0% adults with diabetes     CBG: Recent Labs  Lab 03/02/21 1925  03/02/21 2346 03/03/21 0356 03/03/21 0809 03/03/21 1131  GLUCAP 147* 123* 116* 126* 153*    Review of Systems:   +short of breath - chest pain Otherwise 10 point ROS reviewed and negative  Past Medical History:  He,  has a past  medical history of Arthritis, Asthma, Bilateral carotid bruits (06/12/2018), Cataract, Diabetes mellitus without complication ([place]), GERD (gastroesophageal reflux disease), Gout, Heart murmur, Hypercholesteremia (06/12/2018), Hyperlipidemia, Hypertension, and Mild aortic stenosis (06/12/2018).   Surgical History:   Past Surgical History:  Procedure Laterality Date   AORTIC VALVE REPLACEMENT N/A 03/01/2021   Procedure: AORTIC VALVE REPLACEMENT (AVR) WITH INSPIRIS RESILIA  AORTIC VALVE SIZE 21MM;  Surgeon: [name], MD;  Location: [place];  Service: Open Heart Surgery;  Laterality: N/A;   CARDIAC CATHETERIZATION     CATARACT EXTRACTION Left    CHEST EXPLORATION  03/01/2021   Procedure: CHEST EXPLORATION;  Surgeon: [name], MD;  Location[facility];  Service: Open Heart Surgery;;   COLONOSCOPY     CORONARY ARTERY BYPASS GRAFT N/A 03/01/2021   Procedure: CORONARY ARTERY BYPASS GRAFTING (CABG) X 2 USING LEFT INTERNAL MAMMARY ARTERY AND RIGHT ENDOSCOPIC GREATER SAPHENOUS VEIN CONDUITS;  Surgeon: [name], MD;  Location: [place];  Service: Open Heart Surgery;  Laterality: N/A;   ENDOVEIN HARVEST OF GREATER SAPHENOUS VEIN Right 03/01/2021   Procedure: ENDOVEIN HARVEST OF GREATER SAPHENOUS VEIN;  Surgeon: [name], MD;  Location: [place];  Service: Open Heart Surgery;  Laterality: Right;   EYE SURGERY     POLYPECTOMY     RIGHT HEART CATH AND CORONARY ANGIOGRAPHY N/A 02/06/2021   Procedure: RIGHT HEART CATH AND CORONARY ANGIOGRAPHY;  Surgeon: Nigel Mormon, MD;  Location: [place] [facility];  Service: Cardiovascular;  Laterality: N/A;   TEE WITHOUT CARDIOVERSION N/A 03/01/2021   Procedure: TRANSESOPHAGEAL ECHOCARDIOGRAM  (TEE);  Surgeon: [name], MD;  Location: [place];  Service: Open Heart Surgery;  Laterality: N/A;   THORACIC AORTOGRAM N/A 02/06/2021   Procedure: THORACIC AORTOGRAM;  Surgeon: Nigel Mormon, MD;  Location: [place] [facility];  Service: Cardiovascular;  Laterality: N/A;     Social History:   reports that he has quit smoking. His smoking use included cigars. He has never used smokeless tobacco. He reports that he does not drink alcohol and does not use drugs.   Family History:  His family history is negative for Colon cancer, Esophageal cancer, Stomach cancer, Rectal cancer, and Colon polyps.   Allergies No Known Allergies   Home Medications  Prior to Admission medications   Medication Sig Start Date End Date Taking? Authorizing Provider  acetaminophen (TYLENOL) 500 MG tablet Take 1,000 mg by mouth every 6 (six) hours as needed for moderate pain.   Yes [provider]  aspirin 81 MG tablet Take 81 mg by mouth daily.   Yes [provider]  atorvastatin (LIPITOR) 20 MG tablet Take 20 mg by mouth daily.   Yes [provider]  calcium carbonate (TUMS - DOSED IN MG ELEMENTAL CALCIUM) 500 MG chewable tablet Chew 500-1,000 mg by mouth daily as needed for indigestion or heartburn.   Yes [provider]  clobetasol cream (TEMOVATE) 1.30 % Apply 1 application topically 2 (two) times daily as needed (eczema). 01/10/21  Yes [provider]  dapagliflozin propanediol (FARXIGA) 10 MG TABS tablet Take 1 tablet (10 mg total) by mouth daily before breakfast. 01/22/21 04/22/21 Yes Tolia, Sunit, DO  ENTRESTO 97-103 MG Take 1 tablet by mouth twice daily 02/14/21  Yes Tolia, Sunit, DO  Guaifenesin (MUCINEX MAXIMUM STRENGTH) 1200 MG TB12 Take 1,200 mg by mouth daily.   Yes [provider]  Lutein 6 MG TABS Take 6 mg by mouth daily.   Yes [provider]  magnesium oxide (MAG-OX) 400 MG tablet Take 1 tablet (400 mg total) by mouth  daily. 12/25/20 12/25/21 Yes Tolia, Sunit, DO  metoprolol succinate (TOPROL-XL) 25 MG 24 hr tablet TAKE 1 TABLET BY MOUTH ONCE DAILY. HOLD IF TOP BLOOD PRESSURE NUMBER IS LESS THAN 100 MMHG OR PILSE LES THAN 60 BPM 08/03/20  Yes Tolia, Sunit, DO  Multiple Vitamins-Minerals (MULTIVITAMIN PO) Take 1 tablet by mouth daily.   Yes [provider]  Omega-3 Fatty Acids (FISH OIL) 1000 MG CAPS Take 1,000 mg by mouth daily.   Yes [provider]  OVER THE COUNTER MEDICATION Take 1 tablet by mouth daily. Super Beta Prostate Advanced   Yes [provider]  spironolactone (ALDACTONE) 25 MG tablet TAKE 1 TABLET BY MOUTH ONCE DAILY AT  10  PM 01/25/21  Yes Tolia, Sunit, DO  triamcinolone cream (KENALOG) 0.1 % Apply 1 application topically 2 (two) times daily as needed (eczema). 08/18/20  Yes [provider]     Critical care time: 64    The patient is critically ill due to heart block, respiratory failure.  Critical care was necessary to treat or prevent imminent or life-threatening deterioration.  Critical care was time spent personally by me on the following activities: development of treatment plan with patient and/or surrogate as well as nursing, discussions with consultants, evaluation of patient's response to treatment, examination of patient, obtaining history from patient or surrogate, ordering and performing treatments and interventions, ordering and review of laboratory studies, ordering and review of radiographic studies, pulse oximetry, re-evaluation of patient's condition and participation in multidisciplinary rounds.   Critical Care Time devoted to patient care services described in this note is 45 minutes. This time reflects time of care of this [place] . This critical care time does not reflect separately billable procedures or procedure time, teaching time or supervisory time of PA/NP/Med student/Med Resident etc but could involve care discussion time.        Spero Geralds [facility] 03/03/2021 12:49 PM  Pager: see AMION  If no response to pager , please call critical care on call (see AMION) until 7pm After 7:00 pm call Elink

## 2021-03-03 NOTE — Progress Notes (Signed)
  Echocardiogram 2D Echocardiogram with contrast has been performed.  Keith Wagner F 03/03/2021, 10:56 AM

## 2021-03-03 NOTE — Progress Notes (Signed)
      [address]       [place] [address]             [phone]      POD # 2  Resting comfortably but on BIPAP  BP (!) 154/91   Pulse 87   Temp 97.9 F (36.6 C) (Oral)   Resp (!) 22   Ht 5\' 6"  (1.676 m)   Wt 86 kg   SpO2 (!) 76%   BMI 30.60 kg/m   Intake/Output Summary (Last 24 hours) at 03/03/2021 1807 Last data filed at 03/03/2021 1500 Gross per 24 hour  Intake 2475.41 ml  Output 1335 ml  Net 1140.41 ml   Sat currently 97%  Went into AF with RVR- now back on amiodarone  Pacer in backup mode  [place] C. Roxan Hockey, MD [facility] [phone]

## 2021-03-03 NOTE — Progress Notes (Signed)
      [address]       [place], [address]             [phone]      Reviewed echo images along with Dr. Einar Gip as it was being done  No pericardial effusion/ tamponade Good LV function with no significant wall motion abnormalities Valve functioning well with no perivalvular leak or issues with aorta  CVP= 5  Pacing more consistently now with threshold of 11 mA for V wires While we were adjusting pacer settings had a brief period of AV conduction with rate in 70s but then back into heart block.  Revonda Standard Roxan Hockey, MD [facility] [phone]

## 2021-03-03 NOTE — Progress Notes (Signed)
Loss of capture on pacemaker, hypotension to 60s/40s, O2 sats decreased to 80s.  Pacemaker changed to DOO settings with positive capture, dopamine and neo increased.  Patient changed to HFNC 15 L.  Dr. Roxan Hockey to bedside.

## 2021-03-04 ENCOUNTER — Inpatient Hospital Stay (HOSPITAL_COMMUNITY): Payer: Medicare HMO

## 2021-03-04 DIAGNOSIS — J9811 Atelectasis: Secondary | ICD-10-CM

## 2021-03-04 DIAGNOSIS — J810 Acute pulmonary edema: Secondary | ICD-10-CM

## 2021-03-04 DIAGNOSIS — I2510 Atherosclerotic heart disease of native coronary artery without angina pectoris: Secondary | ICD-10-CM

## 2021-03-04 DIAGNOSIS — I350 Nonrheumatic aortic (valve) stenosis: Secondary | ICD-10-CM | POA: Diagnosis not present

## 2021-03-04 DIAGNOSIS — J90 Pleural effusion, not elsewhere classified: Secondary | ICD-10-CM | POA: Diagnosis not present

## 2021-03-04 DIAGNOSIS — I4891 Unspecified atrial fibrillation: Secondary | ICD-10-CM

## 2021-03-04 LAB — CBC
HCT: 25.3 % — ABNORMAL LOW (ref 39.0–52.0)
Hemoglobin: 8.3 g/dL — ABNORMAL LOW (ref 13.0–17.0)
MCH: 25.4 pg — ABNORMAL LOW (ref 26.0–34.0)
MCHC: 32.8 g/dL (ref 30.0–36.0)
MCV: 77.4 fL — ABNORMAL LOW (ref 80.0–100.0)
Platelets: 168 10*3/uL (ref 150–400)
RBC: 3.27 MIL/uL — ABNORMAL LOW (ref 4.22–5.81)
RDW: 17.5 % — ABNORMAL HIGH (ref 11.5–15.5)
WBC: 13.7 10*3/uL — ABNORMAL HIGH (ref 4.0–10.5)
nRBC: 0 % (ref 0.0–0.2)

## 2021-03-04 LAB — COOXEMETRY PANEL
Carboxyhemoglobin: 1 % (ref 0.5–1.5)
Methemoglobin: 0.8 % (ref 0.0–1.5)
O2 Saturation: 62.9 %
Total hemoglobin: 7.6 g/dL — ABNORMAL LOW (ref 12.0–16.0)

## 2021-03-04 LAB — GLUCOSE, CAPILLARY
Glucose-Capillary: 99 mg/dL (ref 70–99)
Glucose-Capillary: 105 mg/dL — ABNORMAL HIGH (ref 70–99)
Glucose-Capillary: 128 mg/dL — ABNORMAL HIGH (ref 70–99)
Glucose-Capillary: 99 mg/dL (ref 70–99)

## 2021-03-04 LAB — BASIC METABOLIC PANEL
Anion gap: 6 (ref 5–15)
BUN: 17 mg/dL (ref 8–23)
CO2: 24 mmol/L (ref 22–32)
Calcium: 7.5 mg/dL — ABNORMAL LOW (ref 8.9–10.3)
Chloride: 103 mmol/L (ref 98–111)
Creatinine, Ser: 1.45 mg/dL — ABNORMAL HIGH (ref 0.61–1.24)
GFR, Estimated: 49 mL/min — ABNORMAL LOW (ref >60)
Glucose, Bld: 107 mg/dL — ABNORMAL HIGH (ref 70–99)
Potassium: 4.1 mmol/L (ref 3.5–5.1)
Sodium: 133 mmol/L — ABNORMAL LOW (ref 135–145)

## 2021-03-04 LAB — BRAIN NATRIURETIC PEPTIDE: B Natriuretic Peptide: 697.1 pg/mL — ABNORMAL HIGH (ref 0.0–100.0)

## 2021-03-04 MED ORDER — FUROSEMIDE 10 MG/ML IJ SOLN
40.0000 mg | Freq: Two times a day (BID) | INTRAMUSCULAR | Status: DC
  Administered 2021-03-04 – 2021-03-05 (×2): 40 mg via INTRAVENOUS
  Filled 2021-03-04 (×2): qty 4

## 2021-03-04 MED ORDER — POTASSIUM CHLORIDE 10 MEQ/50ML IV SOLN
10.0000 meq | INTRAVENOUS | Status: AC
  Administered 2021-03-04 (×2): 10 meq via INTRAVENOUS
  Filled 2021-03-04 (×2): qty 50

## 2021-03-04 MED ORDER — AMIODARONE HCL 200 MG PO TABS
200.0000 mg | ORAL_TABLET | Freq: Two times a day (BID) | ORAL | Status: DC
  Administered 2021-03-04 (×2): 200 mg via ORAL
  Filled 2021-03-04 (×2): qty 1

## 2021-03-04 NOTE — Progress Notes (Signed)
      [address]       [place] [address]             [phone]      Sleeping  BP 123/77   Pulse 84   Temp 97.9 F (36.6 C) (Oral)   Resp 20   Ht 5\' 6"  (1.676 m)   Wt 87 kg   SpO2 98%   BMI 30.96 kg/m  10L HFNC Dopamine at 5   Intake/Output Summary (Last 24 hours) at 03/04/2021 1750 Last data filed at 03/04/2021 1700 Gross per 24 hour  Intake 1500.02 ml  Output 3760 ml  Net -2259.98 ml   Co-ox 63  Uneventful day so far  [place] C. Roxan Hockey, MD [facility] [phone]

## 2021-03-04 NOTE — Progress Notes (Signed)
1 Day Post-Op Procedure(s) (LRB): TEMPORARY PACEMAKER (N/A) Subjective: No complaints this AM  Objective: Vital signs in last 24 hours: Temp:  [97.5 F (36.4 C)-98.3 F (36.8 C)] 98.3 F (36.8 C) (12/11 0809) Pulse Rate:  [27-129] 90 (12/11 0700) Cardiac Rhythm: Normal sinus rhythm (12/11 0600) Resp:  [10-33] 16 (12/11 0700) BP: (76-156)/(52-96) 101/81 (12/11 0700) SpO2:  [76 %-100 %] 98 % (12/11 0700) Arterial Line BP: (72-159)/(45-79) 78/75 (12/11 0700) FiO2 (%):  [60 %-100 %] 60 % (12/11 0600) Weight:  [87 kg] 87 kg (12/11 0500)  Hemodynamic parameters for last 24 hours: CVP:  [3 mmHg-31 mmHg] 19 mmHg  Intake/Output from previous day: 12/10 0701 - 12/11 0700 In: 2005.8 [P.O.:600; I.V.:932.9; IV Piggyback:472.8] Out: 6811 [Urine:3460] Intake/Output this shift: Total I/O In: -  Out: 135 [Urine:135]  General appearance: alert, cooperative, and no distress Neurologic: intact Heart: regular rate and rhythm and no murmur Lungs: clear to auscultation bilaterally Abdomen: normal findings: soft, non-tender  Lab Results: Recent Labs    03/03/21 0500 03/03/21 1237 03/03/21 1955 03/04/21 0445  WBC 16.8*  --   --  13.7*  HGB 9.2*   < > 8.5* 8.3*  HCT 29.0*   < > 25.0* 25.3*  PLT 190  --   --  168   < > = values in this interval not displayed.   BMET:  Recent Labs    03/03/21 1529 03/03/21 1955 03/04/21 0445  NA 131* 133* 133*  K 3.6 3.5 4.1  CL 102  --  103  CO2 22  --  24  GLUCOSE 148*  --  107*  BUN 17  --  17  CREATININE 1.39*  --  1.45*  CALCIUM 7.5*  --  7.5*    PT/INR:  Recent Labs    03/01/21 1436  LABPROT 17.0*  INR 1.4*   ABG    Component Value Date/Time   PHART 7.419 03/03/2021 1955   HCO3 22.6 03/03/2021 1955   TCO2 24 03/03/2021 1955   ACIDBASEDEF 2.0 03/03/2021 1955   O2SAT 97.0 03/03/2021 1955   CBG (last 3)  Recent Labs    03/03/21 1920 03/03/21 2238 03/04/21 0643  GLUCAP 128* 120* 99    Assessment/Plan: S/P Procedure(s)  (LRB): TEMPORARY PACEMAKER (N/A) POD # 3 AVR CABG NEURO- intact CV- in SR this AM on amiodarone drip, unclear etiology of CHB yesterday  Has EPW and transvenous pacing wire in place  Hemodynamics OK on dopamine at 5 mcg/kg/min  On ASA, statin- beta blocker dc'ed due to CHB  Continue IV amiodarone'  Continue dopamine RESP- oxygenation improved, CXR about the same with atelectasis, effusions  Off BIPAP, now on HFNC  Continue diuresis RENAL- creatinine slightly improved and diuresing well, lytes OK  Continue diuresis GI- abdomen soft, + BS   Advance diet ENDO- CBG well controlled Anemia secondary to ABL- stable, follow SCD + enoxaparin for DVT prophylaxis   LOS: 3 days    [name] 03/04/2021

## 2021-03-04 NOTE — Progress Notes (Signed)
NAME:  Keith Wagner, MRN:  [id number], DOB:  Dec 25, 1940, LOS: 3 ADMISSION DATE:  03/01/2021, CONSULTATION DATE:  03/03/21 REFERRING MD:  [name], MD, CHIEF COMPLAINT:  respiratory failure   History of Present Illness:  Keith Wagner is a 80 year old gentleman with a past medical history of coronary disease and aortic stenosis.  He presented on 12/8 for an elective CABG and aortic valve replacement.  During the surgery he had some episodes of bigeminy intermittently required pacing.  He was ultimately started on amiodarone, and was paced.  Additionally there is a small perforation in the RV wall adjacent to mediastinal chest tube.  This was closed with sutures.  Patient was initially hypotensive but then stabilized after this was closed.  His postoperative course was uncomplicated.  He was extubated.  The evening of 12/9 he developed some episodes of intermittent complete heart block and his pacer wires failed to capture.  This morning he was contacted by Dr. Einar Gip when the patient was found to have progressive hypoxemia on high flow oxygen.  He is in complete heart block.  He was placed on dopamine with somewhat improved capture.  PCCM consulted to assist with respiratory failure and concern for impending intubation. Patient reports breathing more comfortable after transitioning from high flow to bipap. Chest tubes postoperatively have been pulled.  Pertinent  Medical History  Coronary artery disease HTN HLD DM2  Significant Hospital Events: Including procedures, antibiotic start and stop dates in addition to other pertinent events   12/8 CABG x2, AVR with Dr. Roxan Hockey   Interim History / Subjective:  As above  Objective   Blood pressure 106/79, pulse 85, temperature 98.4 F (36.9 C), temperature source Oral, resp. rate 20, height 5\' 6"  (1.676 m), weight 87 kg, SpO2 98 %. CVP:  [3 mmHg-31 mmHg] 12 mmHg  Vent Mode: BIPAP FiO2 (%):  [60 %] 60 % Set Rate:  [14 bmp] 14  bmp PEEP:  [10 cmH20] 10 cmH20   Intake/Output Summary (Last 24 hours) at 03/04/2021 1320 Last data filed at 03/04/2021 1300 Gross per 24 hour  Intake 1357.5 ml  Output 4650 ml  Net -3292.5 ml   Filed Weights   03/02/21 0500 03/03/21 0500 03/04/21 0500  Weight: 83.5 kg 86 kg 87 kg    Examination: General: resting comfortably nasal cannula Lungs: work of breathing improved Cardiovascular: RRR HR in 80s Abdomen: soft, nontender, nondistended Extremities: no edema Neuro: AOx4   Labs/imaging reviewed: Coox 62 ABG repeat yesterday evening improved hypoxemia and hypercapnia Na 133 Cr 1.45  Chest xray - acute pulmonary edema Resolved Hospital Problem list     Assessment & Plan:  Keith Wagner is a 80 y.o. man who is POD 3 from CABG and presents with:  Acute hypoxemic and hypercapnic respiratory failure Acute pulmonary edema On bipap all night, improved. Getting lasix now.  Agree with nocturnal bipap as needed with ongoing diruesis as permitted by renal function  Complete Heart Block Atrial fibrillation with RVR POD #3 CABG x2 and AVR Continue amio Continue TVP    Best Practice (right click and "Reselect all SmartList Selections" daily)   Diet/type: NPO DVT prophylaxis: LMWH GI prophylaxis: N/A Lines: Central line and Arterial Line Foley:  N/A Code Status:  full code Last date of multidisciplinary goals of care discussion [per primary]  PCCM will see as needed - call if we can be of further assistance.   Labs   CBC: Recent Labs  Lab 03/01/21 2008 03/02/21  4098 03/02/21 1643 03/03/21 0500 03/03/21 1237 03/03/21 1955 03/04/21 0445  WBC 14.4* 14.1* 16.3* 16.8*  --   --  13.7*  HGB 9.3* 9.4* 9.0* 9.2* 9.2* 8.5* 8.3*  HCT 29.7* 29.3* 27.9* 29.0* 27.0* 25.0* 25.3*  MCV 78.6* 78.6* 77.9* 79.9*  --   --  77.4*  PLT 149* 175 173 190  --   --  119    Basic Metabolic Panel: Recent Labs  Lab 03/01/21 2008 03/02/21 0326 03/02/21 1643 03/03/21 0500  03/03/21 1237 03/03/21 1529 03/03/21 1955 03/04/21 0445  NA 133* 135 128* 129* 135 131* 133* 133*  K 4.6 4.4 4.1 4.2 3.7 3.6 3.5 4.1  CL 107 104 101 101  --  102  --  103  CO2 20* 22 19* 21*  --  22  --  24  GLUCOSE 178* 118* 139* 117*  --  148*  --  107*  BUN 11 10 12 15   --  17  --  17  CREATININE 0.87 1.05 1.54* 1.61*  --  1.39*  --  1.45*  CALCIUM 8.0* 7.8* 7.7* 7.3*  --  7.5*  --  7.5*  MG 3.3* 2.8* 2.5*  --   --   --   --   --    GFR: Estimated Creatinine Clearance: 42.7 mL/min (A) (by C-G formula based on SCr of 1.45 mg/dL (H)). Recent Labs  Lab 03/02/21 0326 03/02/21 1643 03/03/21 0500 03/04/21 0445  WBC 14.1* 16.3* 16.8* 13.7*    Liver Function Tests: Recent Labs  Lab 02/27/21 1600  AST 22  ALT 18  ALKPHOS 59  BILITOT 0.7  PROT 6.8  ALBUMIN 3.7   Critical care time: 81    The patient is critically ill due to unstable arrhythymia, respiratory failure.  Critical care was necessary to treat or prevent imminent or life-threatening deterioration.  Critical care was time spent personally by me on the following activities: development of treatment plan with patient and/or surrogate as well as nursing, discussions with consultants, evaluation of patient's response to treatment, examination of patient, obtaining history from patient or surrogate, ordering and performing treatments and interventions, ordering and review of laboratory studies, ordering and review of radiographic studies, pulse oximetry, re-evaluation of patient's condition and participation in multidisciplinary rounds.   Critical Care Time devoted to patient care services described in this note is 34 minutes. This time reflects time of care of this [place] . This critical care time does not reflect separately billable procedures or procedure time, teaching time or supervisory time of PA/NP/Med student/Med Resident etc but could involve care discussion time.       Spero Geralds [place]  [facility] 03/04/2021 1:20 PM  Pager: see AMION  If no response to pager , please call critical care on call (see AMION) until 7pm After 7:00 pm call Elink

## 2021-03-04 NOTE — Progress Notes (Addendum)
Subjective:  Sodium stable at 133. Creatinine decreased compared to yesterday from 1.61 to 1.45 BNP improving, now 697  Patient feels well, dyspnea has improved, he has not needed any more BiPAP support, appetite is also improving.  Intake/Output from previous day:  I/O last 3 completed shifts: In: 3674.3 [P.O.:600; I.V.:1916.4; IV Piggyback:1157.9] Out: 3780 [Urine:3780] Total I/O In: -  Out: 485 [Urine:485]  Blood pressure 101/81, pulse 90, temperature 98.3 F (36.8 C), temperature source Oral, resp. rate 16, height _0  (1.676 m), weight 87 kg, SpO2 98 %. Physical Exam Vitals reviewed.  Constitutional:      Appearance: He is obese.  HENT:     Head: Normocephalic and atraumatic.  Neck:     Comments: Difficult to make out JVD in view of presence of central lines. Cardiovascular:     Rate and Rhythm: Normal rate and regular rhythm.     Pulses: Intact distal pulses.          Dorsalis pedis pulses are 1+ on the right side and 1+ on the left side.       Posterior tibial pulses are 1+ on the right side and 1+ on the left side.     Heart sounds: S1 normal and S2 normal. Murmur heard.  Harsh systolic murmur is present with a grade of 2/6 at the upper right sternal border.    No gallop.  Pulmonary:     Effort: Pulmonary effort is normal. No respiratory distress.     Breath sounds: No wheezing, rhonchi or rales.  Abdominal:     General: Abdomen is flat. Bowel sounds are normal.     Palpations: Abdomen is soft.  Musculoskeletal:     Right lower leg: No edema.     Left lower leg: No edema.  Neurological:     Mental Status: He is alert.   Lab Results: BMP BNP (last 3 results) Recent Labs    03/03/21 1529 03/04/21 0445  BNP 1,031.8* 697.1*    ProBNP (last 3 results) Recent Labs    12/22/20 0833 01/01/21 0930 01/30/21 0920  PROBNP 1,673* 1,231* 412   BMP Latest Ref Rng & Units 03/04/2021 03/03/2021 03/03/2021  Glucose 70 - 99 mg/dL 107(H) - 148(H)  BUN 8 - 23 mg/dL  17 - 17  Creatinine 0.61 - 1.24 mg/dL 1.45(H) - 1.39(H)  BUN/Creat Ratio 10 - 24 - - -  Sodium 135 - 145 mmol/L 133(L) 133(L) 131(L)  Potassium 3.5 - 5.1 mmol/L 4.1 3.5 3.6  Chloride 98 - 111 mmol/L 103 - 102  CO2 22 - 32 mmol/L 24 - 22  Calcium 8.9 - 10.3 mg/dL 7.5(L) - 7.5(L)   Hepatic Function Latest Ref Rng & Units 02/27/2021 12/08/2020  Total Protein 6.5 - 8.1 g/dL 6.8 6.5  Albumin 3.5 - 5.0 g/dL 3.7 4.1  AST 15 - 41 U/L 22 24  ALT 0 - 44 U/L 18 18  Alk Phosphatase 38 - 126 U/L 59 69  Total Bilirubin 0.3 - 1.2 mg/dL 0.7 0.3   CBC Latest Ref Rng & Units 03/04/2021 03/03/2021 03/03/2021  WBC 4.0 - 10.5 K/uL 13.7(H) - -  Hemoglobin 13.0 - 17.0 g/dL 8.3(L) 8.5(L) 9.2(L)  Hematocrit 39.0 - 52.0 % 25.3(L) 25.0(L) 27.0(L)  Platelets 150 - 400 K/uL 168 - -   Lipid Panel  No results found for: CHOL, TRIG, HDL, CHOLHDL, VLDL, LDLCALC, LDLDIRECT Cardiac Panel (last 3 results) No results for input(s): CKTOTAL, CKMB, TROPONINI, RELINDX in the last 72 hours.  HEMOGLOBIN  A1C Lab Results  Component Value Date   HGBA1C 6.3 (H) 02/27/2021   MPG 134.11 02/27/2021   TSH No results for input(s): TSH in the last 8760 hours.  Imaging: Imaging results have been reviewed  Cardiac Studies: Left Heart Catheterization 02/06/2021: LM: Distal calcific 75% stenosis LAD: Mid 80% stenosis LCX: large vessel. Ostial 70% stenosis.  RCA: Dominant, but small vessel. No significant disease seen on well opacified non-selective angiogram.   Normal PCW Unable to cross aortic valve due to severe aorta tortuosity.  1. Left ventricular ejection fraction, by estimation, is 50 to 55%. Left ventricular ejection fraction by PLAX is 45 %. The left ventricle has low normal function. The left ventricle has no regional wall motion abnormalities. There is mild left  ventricular hypertrophy. Left ventricular diastolic parameters are consistent with Grade II diastolic dysfunction (pseudonormalization). Elevated left  ventricular end-diastolic pressure.  2. Right ventricular systolic function is normal. The right ventricular size is normal.  3. Left atrial size was moderately dilated.  4. The mitral valve was not well visualized. Trivial mitral valve regurgitation.  5. The aortic valve was not well visualized. Aortic valve regurgitation is not visualized. Mild aortic valve stenosis. There is a 21 mm Edwards Edwards InspirEase Resilia valve present in the aortic position. Aortic valve area, by VTI measures 0.98 cm.  Aortic valve mean gradient measures 17.5 mmHg. Aortic valve Vmax measures 2.77 m/s.  6. The inferior vena cava is dilated in size with <50% respiratory variability, suggesting right atrial pressure of 15 mmHg.   CABG x2 on 03/01/2021: LIMA to LAD and SVG to OM and aortic valve replacement with a 21 mm Edwards InspirEase Resilia bioprosthetic valve.  Temporary Pacer Placement 12/102022:  Left IJ vein access uner ultrasound guidance.  Tempo balloontipped pacemaker was introduced into the right ventricular apex under direct visualization, the prongs were deployed and balloon deflated and V pacing obtained at MA of 1.0. Pacemaker left at demand VVI pace at 80 bpm with a MA of 5.0.  Patient tolerated procedure.  No immediate complication.  Echocardiogram 03/03/2021:   1. Left ventricular ejection fraction, by estimation, is 50 to 55%. Left  ventricular ejection fraction by PLAX is 45 %. The left ventricle has low  normal function. The left ventricle has no regional wall motion  abnormalities. There is mild left  ventricular hypertrophy. Left ventricular diastolic parameters are  consistent with Grade II diastolic dysfunction (pseudonormalization).  Elevated left ventricular end-diastolic pressure.   2. Right ventricular systolic function is normal. The right ventricular  size is normal.   3. Left atrial size was moderately dilated.   4. The mitral valve was not well visualized. Trivial mitral  valve  regurgitation.   5. The aortic valve was not well visualized. Aortic valve regurgitation  is not visualized. Mild aortic valve stenosis. There is a 21 mm Edwards  Edwards InspirEase Resilia valve present in the aortic position. Aortic  valve area, by VTI measures 0.98 cm.   Aortic valve mean gradient measures 17.5 mmHg. Aortic valve Vmax measures  2.77 m/s.   6. The inferior vena cava is dilated in size with <50% respiratory  variability, suggesting right atrial pressure of 15 mmHg.   EKG:  EKG 03/03/2021: Episodes of sinus arrest with junctional escape, PACs.  Nonspecific T abnormality.   EKG 03/02/2021: Normal sinus rhythm at rate of 76 bpm, normal axis, nonspecific T abnormality.   Scheduled Meds:  acetaminophen  1,000 mg Oral Q6H   Or  acetaminophen (TYLENOL) oral liquid 160 mg/5 mL  1,000 mg Per Tube Q6H   aspirin EC  325 mg Oral Daily   Or   aspirin  324 mg Per Tube Daily   atorvastatin  40 mg Oral Daily   bisacodyl  10 mg Oral Daily   Or   bisacodyl  10 mg Rectal Daily   chlorhexidine gluconate (MEDLINE KIT)  15 mL Mouth Rinse BID   Chlorhexidine Gluconate Cloth  6 each Topical Daily   docusate sodium  200 mg Oral Daily   enoxaparin (LOVENOX) injection  30 mg Subcutaneous QHS   furosemide  40 mg Intravenous Q8H   insulin aspart  0-15 Units Subcutaneous TID WC   insulin detemir  15 Units Subcutaneous Daily   magnesium oxide  400 mg Oral Daily   mouth rinse  15 mL Mouth Rinse 10 times per day   mupirocin ointment  1 application Nasal BID   pantoprazole  40 mg Oral Daily   sodium chloride flush  10-40 mL Intracatheter Q12H   sodium chloride flush  3 mL Intravenous Q12H   Continuous Infusions:  sodium chloride Stopped (03/02/21 0801)   sodium chloride     sodium chloride 20 mL/hr at 03/01/21 1512   albumin human     amiodarone 30 mg/hr (03/04/21 0700)   DOPamine 5 mcg/kg/min (03/04/21 0600)   lactated ringers     lactated ringers 10 mL/hr at 03/01/21 0747    lactated ringers Stopped (03/01/21 1737)   lactated ringers Stopped (03/02/21 2150)   phenylephrine (NEO-SYNEPHRINE) Adult infusion 20 mcg/min (03/04/21 0700)   potassium chloride     PRN Meds:.sodium chloride, albuterol, calcium carbonate, lactated ringers, metoprolol tartrate, midazolam, morphine injection, ondansetron (ZOFRAN) IV, oxyCODONE, sodium chloride flush, sodium chloride flush, traMADol  Assessment/Plan:  Keith Wagner is a 80 y.o. Caucasian male patient with aortic stenosis, hypertension, type 2 diabetes mellitus, hyperlipidemia who underwent coronary angiography on 02/06/2021 for evaluation of dyspnea and aortic stenosis, found to have calcific distal left main 45% stenosis and mid LAD 80% and ostial circumflex 70% stenosis for which he underwent CABG x2 with LIMA to LAD and SVG to OM and aortic valve replacement with a 21 mm Edwards InspirEase Resilia bioprosthetic valve on 03/01/2021. He subsequently developed episodes of heart block and worsening renal function.   1.  Acute diastolic heart failure 2.  Sinus node dysfunction 3. Nonischemic cardiomyopathy, left main disease SP CABG and aortic valve replacement for moderate aortic stenosis, symptomatic on 03/01/2021. 4.  Acute renal failure stage IIIa secondary to hypotension and acute tubular necrosis. 5.  Hyponatremia due to volume excess and acute diastolic heart failure 6.  Postop atrial fibrillation, back on IV amiodarone  Recommendation: CVP today is 12, symptoms of heart failure has improved significantly.  We will reduce the dose of furosemide to 40 mg IV every 12.  Mobilize the patient, continue dopamine for now, will try to wean it to 2.5 mcg/kg/min.  Last night converted to sinus rhythm, but again needed pacing as he developed mild hypotension.  This morning he is in sinus rhythm and vitals are stable.  Renal function is improving as well.  Hyponatremia is also improving.  All the trends are in the positive  direction.  With regard to atrial fibrillation, patient is back in sinus rhythm.  I will discontinue IV amiodarone and switch him to p.o. amiodarone.  His abdomen is also less distended and bowel sounds are heard.  Met with the family as  well and updated them.  This is a 35-minute critical care visit including management of acute diastolic heart failure, sinus node dysfunction and adjustment of temporary transvenous pacemaker which is now functioning normally with good capture and good position.   Adrian Prows, MD, [facility] 03/04/2021, 1:51 PM Office: [phone] Fax: [phone] Pager: [phone]

## 2021-03-05 ENCOUNTER — Encounter (HOSPITAL_COMMUNITY): Payer: Self-pay | Admitting: Cardiology

## 2021-03-05 ENCOUNTER — Inpatient Hospital Stay (HOSPITAL_COMMUNITY): Payer: Medicare HMO

## 2021-03-05 DIAGNOSIS — I5031 Acute diastolic (congestive) heart failure: Secondary | ICD-10-CM

## 2021-03-05 DIAGNOSIS — I480 Paroxysmal atrial fibrillation: Secondary | ICD-10-CM | POA: Diagnosis not present

## 2021-03-05 DIAGNOSIS — I495 Sick sinus syndrome: Secondary | ICD-10-CM | POA: Diagnosis not present

## 2021-03-05 LAB — BPAM RBC
Blood Product Expiration Date: 202212262359
Blood Product Expiration Date: 202212262359
Blood Product Expiration Date: 202212262359
Blood Product Expiration Date: 202212272359
ISSUE DATE / TIME: 202212081313
ISSUE DATE / TIME: 202212081313
Unit Type and Rh: 6200
Unit Type and Rh: 6200
Unit Type and Rh: 6200
Unit Type and Rh: 6200

## 2021-03-05 LAB — POCT I-STAT 7, (LYTES, BLD GAS, ICA,H+H)
Acid-base deficit: 8 mmol/L — ABNORMAL HIGH (ref 0.0–2.0)
Bicarbonate: 16.2 mmol/L — ABNORMAL LOW (ref 20.0–28.0)
Calcium, Ion: 1.13 mmol/L — ABNORMAL LOW (ref 1.15–1.40)
HCT: 28 % — ABNORMAL LOW (ref 39.0–52.0)
Hemoglobin: 9.5 g/dL — ABNORMAL LOW (ref 13.0–17.0)
O2 Saturation: 78 %
Patient temperature: 98.3
Potassium: 3.9 mmol/L (ref 3.5–5.1)
Sodium: 131 mmol/L — ABNORMAL LOW (ref 135–145)
TCO2: 17 mmol/L — ABNORMAL LOW (ref 22–32)
pCO2 arterial: 28.7 mmHg — ABNORMAL LOW (ref 32.0–48.0)
pH, Arterial: 7.36 (ref 7.350–7.450)
pO2, Arterial: 43 mmHg — ABNORMAL LOW (ref 83.0–108.0)

## 2021-03-05 LAB — TYPE AND SCREEN
ABO/RH(D): A POS
Antibody Screen: NEGATIVE
Unit division: 0
Unit division: 0
Unit division: 0
Unit division: 0

## 2021-03-05 LAB — BASIC METABOLIC PANEL
Anion gap: 6 (ref 5–15)
BUN: 24 mg/dL — ABNORMAL HIGH (ref 8–23)
CO2: 24 mmol/L (ref 22–32)
Calcium: 7.3 mg/dL — ABNORMAL LOW (ref 8.9–10.3)
Chloride: 103 mmol/L (ref 98–111)
Creatinine, Ser: 1.29 mg/dL — ABNORMAL HIGH (ref 0.61–1.24)
GFR, Estimated: 56 mL/min — ABNORMAL LOW (ref >60)
Glucose, Bld: 96 mg/dL (ref 70–99)
Potassium: 3.7 mmol/L (ref 3.5–5.1)
Sodium: 133 mmol/L — ABNORMAL LOW (ref 135–145)

## 2021-03-05 LAB — CBC
HCT: 25.3 % — ABNORMAL LOW (ref 39.0–52.0)
Hemoglobin: 8.1 g/dL — ABNORMAL LOW (ref 13.0–17.0)
MCH: 25.3 pg — ABNORMAL LOW (ref 26.0–34.0)
MCHC: 32 g/dL (ref 30.0–36.0)
MCV: 79.1 fL — ABNORMAL LOW (ref 80.0–100.0)
Platelets: 179 10*3/uL (ref 150–400)
RBC: 3.2 MIL/uL — ABNORMAL LOW (ref 4.22–5.81)
RDW: 17.9 % — ABNORMAL HIGH (ref 11.5–15.5)
WBC: 12.6 10*3/uL — ABNORMAL HIGH (ref 4.0–10.5)
nRBC: 0 % (ref 0.0–0.2)

## 2021-03-05 LAB — BRAIN NATRIURETIC PEPTIDE: B Natriuretic Peptide: 549.2 pg/mL — ABNORMAL HIGH (ref 0.0–100.0)

## 2021-03-05 LAB — GLUCOSE, CAPILLARY
Glucose-Capillary: 105 mg/dL — ABNORMAL HIGH (ref 70–99)
Glucose-Capillary: 113 mg/dL — ABNORMAL HIGH (ref 70–99)
Glucose-Capillary: 88 mg/dL (ref 70–99)
Glucose-Capillary: 122 mg/dL — ABNORMAL HIGH (ref 70–99)

## 2021-03-05 MED ORDER — FUROSEMIDE 10 MG/ML IJ SOLN
60.0000 mg | Freq: Two times a day (BID) | INTRAMUSCULAR | Status: DC
  Administered 2021-03-05 – 2021-03-06 (×3): 60 mg via INTRAVENOUS
  Filled 2021-03-05 (×4): qty 6

## 2021-03-05 MED ORDER — AMIODARONE HCL 200 MG PO TABS
400.0000 mg | ORAL_TABLET | Freq: Two times a day (BID) | ORAL | Status: AC
  Administered 2021-03-05 – 2021-03-10 (×12): 400 mg via ORAL
  Filled 2021-03-05 (×12): qty 2

## 2021-03-05 MED ORDER — FUROSEMIDE 10 MG/ML IJ SOLN: 40.0000 mg | Freq: Every day | INTRAMUSCULAR | Status: DC

## 2021-03-05 MED ORDER — ORAL CARE MOUTH RINSE
15.0000 mL | Freq: Four times a day (QID) | OROMUCOSAL | Status: DC
  Administered 2021-03-05 – 2021-03-11 (×19): 15 mL via OROMUCOSAL

## 2021-03-05 MED ORDER — AMIODARONE IV BOLUS ONLY 150 MG/100ML
150.0000 mg | Freq: Once | INTRAVENOUS | Status: AC
  Administered 2021-03-05: 150 mg via INTRAVENOUS
  Filled 2021-03-05: qty 100

## 2021-03-05 MED ORDER — POTASSIUM CHLORIDE CRYS ER 20 MEQ PO TBCR
20.0000 meq | EXTENDED_RELEASE_TABLET | ORAL | Status: AC
  Administered 2021-03-05 (×3): 20 meq via ORAL
  Filled 2021-03-05 (×2): qty 1

## 2021-03-05 MED ORDER — POTASSIUM CHLORIDE 10 MEQ/50ML IV SOLN
10.0000 meq | INTRAVENOUS | Status: AC
  Administered 2021-03-05 (×3): 10 meq via INTRAVENOUS
  Filled 2021-03-05: qty 50

## 2021-03-05 MED ORDER — METOPROLOL TARTRATE 25 MG PO TABS: 25.0000 mg | ORAL_TABLET | Freq: Two times a day (BID) | ORAL | Status: DC

## 2021-03-05 NOTE — Progress Notes (Signed)
Patient removed from BIPAP for comfort. Unable to tolerate/rest on BIPAP. No distress noted. Vitals stable. Placed on [place] @ 8L.

## 2021-03-05 NOTE — Progress Notes (Signed)
2 Days Post-Op Procedure(s) (LRB): TEMPORARY PACEMAKER (N/A) Subjective: No complaints this AM, ate part of his breakfast, denies pain  Objective: Vital signs in last 24 hours: Temp:  [97.9 F (36.6 C)-98.4 F (36.9 C)] 98 F (36.7 C) (12/12 0400) Pulse Rate:  [70-90] 77 (12/12 0700) Cardiac Rhythm: Normal sinus rhythm (12/12 0600) Resp:  [13-29] 25 (12/12 0700) BP: (88-141)/(54-94) 132/82 (12/12 0700) SpO2:  [88 %-100 %] 95 % (12/12 0700) Arterial Line BP: (71-132)/(52-81) 74/66 (12/12 0600) FiO2 (%):  [50 %] 50 % (12/11 2225)  Hemodynamic parameters for last 24 hours: CVP:  [0 mmHg-19 mmHg] 0 mmHg  Intake/Output from previous day: 12/11 0701 - 12/12 0700 In: 562.6 [P.O.:120; I.V.:342.6; IV Piggyback:100] Out: 6226 [Urine:3565] Intake/Output this shift: No intake/output data recorded.  General appearance: alert, cooperative, and no distress Neurologic: intact Heart: regular rate and rhythm Lungs: diminished breath sounds bibasilar Abdomen: normal findings: soft, non-tender Wound: clean and dry  Lab Results: Recent Labs    03/04/21 0445 03/05/21 0423  WBC 13.7* 12.6*  HGB 8.3* 8.1*  HCT 25.3* 25.3*  PLT 168 179   BMET:  Recent Labs    03/04/21 0445 03/05/21 0423  NA 133* 133*  K 4.1 3.7  CL 103 103  CO2 24 24  GLUCOSE 107* 96  BUN 17 24*  CREATININE 1.45* 1.29*  CALCIUM 7.5* 7.3*    PT/INR: No results for input(s): LABPROT, INR in the last 72 hours. ABG    Component Value Date/Time   PHART 7.419 03/03/2021 1955   HCO3 22.6 03/03/2021 1955   TCO2 24 03/03/2021 1955   ACIDBASEDEF 2.0 03/03/2021 1955   O2SAT 62.9 03/04/2021 0838   CBG (last 3)  Recent Labs    03/04/21 1632 03/04/21 2145 03/05/21 0645  GLUCAP 128* 99 105*    Assessment/Plan: S/P Procedure(s) (LRB): TEMPORARY PACEMAKER (N/A) POD # 4 NEURO- intact CV- went back into atrial fib with RVR this AM  Will bolus with amiodarone  Dopamine down to 2.5 mcg/kg/min- will wean off  later today  No further heart block- temp pacer per Cardiology  BNP trending down with diuresis RESP- down to 8L HFNC- diuresed well  CXR dhows decreased pleural effusions but not completely resolved RENAL- creatinine trending down and diuresing well cw resolving AKI  Supplement K ENDO- CBG well controlled GI- tolerating PO Anemia stable SCD + enoxaparin for DVT prophylaxis   LOS: 4 days    Keith Wagner 03/05/2021

## 2021-03-05 NOTE — Progress Notes (Signed)
EVENING ROUNDS NOTE :     [id number]       [place] [address]             [phone]                 2 Days Post-Op Procedure(s) (LRB): TEMPORARY PACEMAKER (N/A)   Total Length of Stay:  LOS: 4 days  Events:   No events Back in sinus Off gtts     BP 139/82   Pulse 67   Temp 98 F (36.7 C) (Oral)   Resp (!) 24   Ht 5\' 6"  (1.676 m)   Wt 87 kg   SpO2 99%   BMI 30.96 kg/m   CVP:  [0 mmHg-11 mmHg] 11 mmHg  Vent Mode: Stand-by FiO2 (%):  [50 %] 50 % Set Rate:  [14 bmp] 14 bmp PEEP:  [5 cmH20] 5 cmH20   sodium chloride Stopped (03/02/21 0801)   sodium chloride     sodium chloride 20 mL/hr at 03/01/21 1512   albumin human     DOPamine Stopped (03/05/21 1042)   lactated ringers     lactated ringers 10 mL/hr at 03/01/21 0747   lactated ringers Stopped (03/01/21 1737)   lactated ringers Stopped (03/02/21 2150)   phenylephrine (NEO-SYNEPHRINE) Adult infusion Stopped (03/05/21 1440)    I/O last 3 completed shifts: In: 1397.2 [P.O.:120; I.V.:927.9; IV Piggyback:349.3] Out: 5550 [Urine:5550]   CBC Latest Ref Rng & Units 03/05/2021 03/04/2021 03/03/2021  WBC 4.0 - 10.5 K/uL 12.6(H) 13.7(H) -  Hemoglobin 13.0 - 17.0 g/dL 8.1(L) 8.3(L) 8.5(L)  Hematocrit 39.0 - 52.0 % 25.3(L) 25.3(L) 25.0(L)  Platelets 150 - 400 K/uL 179 168 -    BMP Latest Ref Rng & Units 03/05/2021 03/04/2021 03/03/2021  Glucose 70 - 99 mg/dL 96 107(H) -  BUN 8 - 23 mg/dL 24(H) 17 -  Creatinine 0.61 - 1.24 mg/dL 1.29(H) 1.45(H) -  BUN/Creat Ratio 10 - 24 - - -  Sodium 135 - 145 mmol/L 133(L) 133(L) 133(L)  Potassium 3.5 - 5.1 mmol/L 3.7 4.1 3.5  Chloride 98 - 111 mmol/L 103 103 -  CO2 22 - 32 mmol/L 24 24 -  Calcium 8.9 - 10.3 mg/dL 7.3(L) 7.5(L) -    ABG    Component Value Date/Time   PHART 7.419 03/03/2021 1955   PCO2ART 34.7 03/03/2021 1955   PO2ART 87 03/03/2021 1955   HCO3 22.6 03/03/2021 1955   TCO2 24 03/03/2021 1955   ACIDBASEDEF 2.0 03/03/2021 1955    O2SAT 62.9 03/04/2021 1027       Melodie Bouillon, MD 03/05/2021 4:28 PM

## 2021-03-05 NOTE — Progress Notes (Signed)
Subjective:  Feels well, has not needed BiPAP support, dyspnea has improved, had a bowel movement this morning.  Intake/Output from previous day:  I/O last 3 completed shifts: In: 1397.2 [P.O.:120; I.V.:927.9; IV Piggyback:349.3] Out: 5550 [Urine:5550] No intake/output data recorded.  Blood pressure 132/82, pulse 77, temperature 98.3 F (36.8 C), temperature source Oral, resp. rate (!) 25, height 5' 6" (1.676 m), weight 87 kg, SpO2 95 %. Physical Exam Vitals reviewed.  Constitutional:      Appearance: He is obese.  HENT:     Head: Normocephalic and atraumatic.  Neck:     Comments: Difficult to make out JVD in view of presence of central lines. Cardiovascular:     Rate and Rhythm: Normal rate and regular rhythm.     Pulses: Intact distal pulses.          Dorsalis pedis pulses are 1+ on the right side and 1+ on the left side.       Posterior tibial pulses are 1+ on the right side and 1+ on the left side.     Heart sounds: S1 normal and S2 normal. Murmur heard.  Harsh systolic murmur is present with a grade of 2/6 at the upper right sternal border.    No gallop.  Pulmonary:     Effort: Pulmonary effort is normal. No respiratory distress.     Breath sounds: No wheezing, rhonchi or rales.  Abdominal:     General: Abdomen is flat. Bowel sounds are normal.     Palpations: Abdomen is soft.  Musculoskeletal:     Right lower leg: No edema.     Left lower leg: No edema.  Neurological:     Mental Status: He is alert.   Lab Results: BMP BNP (last 3 results) Recent Labs    03/03/21 1529 03/04/21 0445 03/05/21 0423  BNP 1,031.8* 697.1* 549.2*     ProBNP (last 3 results) Recent Labs    12/22/20 0833 01/01/21 0930 01/30/21 0920  PROBNP 1,673* 1,231* 412    BMP Latest Ref Rng & Units 03/05/2021 03/04/2021 03/03/2021  Glucose 70 - 99 mg/dL 96 107(H) -  BUN 8 - 23 mg/dL 24(H) 17 -  Creatinine 0.61 - 1.24 mg/dL 1.29(H) 1.45(H) -  BUN/Creat Ratio 10 - 24 - - -  Sodium 135  - 145 mmol/L 133(L) 133(L) 133(L)  Potassium 3.5 - 5.1 mmol/L 3.7 4.1 3.5  Chloride 98 - 111 mmol/L 103 103 -  CO2 22 - 32 mmol/L 24 24 -  Calcium 8.9 - 10.3 mg/dL 7.3(L) 7.5(L) -   Hepatic Function Latest Ref Rng & Units 02/27/2021 12/08/2020  Total Protein 6.5 - 8.1 g/dL 6.8 6.5  Albumin 3.5 - 5.0 g/dL 3.7 4.1  AST 15 - 41 U/L 22 24  ALT 0 - 44 U/L 18 18  Alk Phosphatase 38 - 126 U/L 59 69  Total Bilirubin 0.3 - 1.2 mg/dL 0.7 0.3   CBC Latest Ref Rng & Units 03/05/2021 03/04/2021 03/03/2021  WBC 4.0 - 10.5 K/uL 12.6(H) 13.7(H) -  Hemoglobin 13.0 - 17.0 g/dL 8.1(L) 8.3(L) 8.5(L)  Hematocrit 39.0 - 52.0 % 25.3(L) 25.3(L) 25.0(L)  Platelets 150 - 400 K/uL 179 168 -   Lipid Panel  No results found for: CHOL, TRIG, HDL, CHOLHDL, VLDL, LDLCALC, LDLDIRECT Cardiac Panel (last 3 results) No results for input(s): CKTOTAL, CKMB, TROPONINI, RELINDX in the last 72 hours.  HEMOGLOBIN A1C Lab Results  Component Value Date   HGBA1C 6.3 (H) 02/27/2021   MPG 134.11 02/27/2021  TSH No results for input(s): TSH in the last 8760 hours.  Imaging: Imaging results have been reviewed  Cardiac Studies: Left Heart Catheterization 02/06/2021: LM: Distal calcific 75% stenosis LAD: Mid 80% stenosis LCX: large vessel. Ostial 70% stenosis.  RCA: Dominant, but small vessel. No significant disease seen on well opacified non-selective angiogram.   Normal PCW Unable to cross aortic valve due to severe aorta tortuosity.  1. Left ventricular ejection fraction, by estimation, is 50 to 55%. Left ventricular ejection fraction by PLAX is 45 %. The left ventricle has low normal function. The left ventricle has no regional wall motion abnormalities. There is mild left  ventricular hypertrophy. Left ventricular diastolic parameters are consistent with Grade II diastolic dysfunction (pseudonormalization). Elevated left ventricular end-diastolic pressure.  2. Right ventricular systolic function is normal. The  right ventricular size is normal.  3. Left atrial size was moderately dilated.  4. The mitral valve was not well visualized. Trivial mitral valve regurgitation.  5. The aortic valve was not well visualized. Aortic valve regurgitation is not visualized. Mild aortic valve stenosis. There is a 21 mm Edwards Edwards InspirEase Resilia valve present in the aortic position. Aortic valve area, by VTI measures 0.98 cm.  Aortic valve mean gradient measures 17.5 mmHg. Aortic valve Vmax measures 2.77 m/s.  6. The inferior vena cava is dilated in size with <50% respiratory variability, suggesting right atrial pressure of 15 mmHg.   CABG x2 on 03/01/2021: LIMA to LAD and SVG to OM and aortic valve replacement with a 21 mm Edwards InspirEase Resilia bioprosthetic valve.  Temporary Pacer Placement 12/102022:  Left IJ vein access uner ultrasound guidance.  Tempo balloontipped pacemaker was introduced into the right ventricular apex under direct visualization, the prongs were deployed and balloon deflated and V pacing obtained at MA of 1.0. Pacemaker left at demand VVI pace at 80 bpm with a MA of 5.0.  Patient tolerated procedure.  No immediate complication.  Echocardiogram 03/03/2021:   1. Left ventricular ejection fraction, by estimation, is 50 to 55%. Left  ventricular ejection fraction by PLAX is 45 %. The left ventricle has low  normal function. The left ventricle has no regional wall motion  abnormalities. There is mild left  ventricular hypertrophy. Left ventricular diastolic parameters are  consistent with Grade II diastolic dysfunction (pseudonormalization).  Elevated left ventricular end-diastolic pressure.   2. Right ventricular systolic function is normal. The right ventricular  size is normal.   3. Left atrial size was moderately dilated.   4. The mitral valve was not well visualized. Trivial mitral valve  regurgitation.   5. The aortic valve was not well visualized. Aortic valve  regurgitation  is not visualized. Mild aortic valve stenosis. There is a 21 mm Edwards  Edwards InspirEase Resilia valve present in the aortic position. Aortic  valve area, by VTI measures 0.98 cm.   Aortic valve mean gradient measures 17.5 mmHg. Aortic valve Vmax measures  2.77 m/s.   6. The inferior vena cava is dilated in size with <50% respiratory  variability, suggesting right atrial pressure of 15 mmHg.   EKG:  EKG 03/03/2021: Episodes of sinus arrest with junctional escape, PACs.  Nonspecific T abnormality.   EKG 03/02/2021: Normal sinus rhythm at rate of 76 bpm, normal axis, nonspecific T abnormality.   Scheduled Meds:  acetaminophen  1,000 mg Oral Q6H   Or   acetaminophen (TYLENOL) oral liquid 160 mg/5 mL  1,000 mg Per Tube Q6H   amiodarone  400 mg Oral  BID   aspirin EC  325 mg Oral Daily   Or   aspirin  324 mg Per Tube Daily   atorvastatin  40 mg Oral Daily   bisacodyl  10 mg Oral Daily   Or   bisacodyl  10 mg Rectal Daily   chlorhexidine gluconate (MEDLINE KIT)  15 mL Mouth Rinse BID   Chlorhexidine Gluconate Cloth  6 each Topical Daily   docusate sodium  200 mg Oral Daily   enoxaparin (LOVENOX) injection  30 mg Subcutaneous QHS   [START ON 03/06/2021] furosemide  40 mg Intravenous Daily   insulin aspart  0-15 Units Subcutaneous TID WC   insulin detemir  15 Units Subcutaneous Daily   magnesium oxide  400 mg Oral Daily   mouth rinse  15 mL Mouth Rinse 10 times per day   metoprolol tartrate  25 mg Oral BID   mupirocin ointment  1 application Nasal BID   pantoprazole  40 mg Oral Daily   potassium chloride  20 mEq Oral Q4H   sodium chloride flush  10-40 mL Intracatheter Q12H   sodium chloride flush  3 mL Intravenous Q12H   Continuous Infusions:  sodium chloride Stopped (03/02/21 0801)   sodium chloride     sodium chloride 20 mL/hr at 03/01/21 1512   albumin human     DOPamine 2.5 mcg/kg/min (03/05/21 0834)   lactated ringers     lactated ringers 10 mL/hr at  03/01/21 0747   lactated ringers Stopped (03/01/21 1737)   lactated ringers Stopped (03/02/21 2150)   phenylephrine (NEO-SYNEPHRINE) Adult infusion Stopped (03/04/21 1033)   potassium chloride 10 mEq (03/05/21 0807)   PRN Meds:.sodium chloride, albuterol, calcium carbonate, lactated ringers, metoprolol tartrate, midazolam, morphine injection, ondansetron (ZOFRAN) IV, oxyCODONE, sodium chloride flush, sodium chloride flush, traMADol  Assessment/Plan:  Keith Wagner is a 80 y.o. Caucasian male patient with aortic stenosis, hypertension, type 2 diabetes mellitus, hyperlipidemia who underwent coronary angiography on 02/06/2021 for evaluation of dyspnea and aortic stenosis, found to have calcific distal left main 45% stenosis and mid LAD 80% and ostial circumflex 70% stenosis for which he underwent CABG x2 with LIMA to LAD and SVG to OM and aortic valve replacement with a 21 mm Edwards InspirEase Resilia bioprosthetic valve on 03/01/2021. He subsequently developed episodes of heart block and worsening renal function.   1.  Acute diastolic heart failure 2.  Sinus node dysfunction and paroxysmal atrial fibrillation, postop 3. Nonischemic cardiomyopathy, left main disease SP CABG and aortic valve replacement for moderate aortic stenosis, symptomatic on 03/01/2021.  EF prior to CABG was 35%, recent echo revealing at least a low normal LVEF. 4.  Chronic renal failure stage III, stable.  Acute renal failure has resolved.  Recommendation: CVP today is 10-12, symptoms of heart failure has improved significantly.  Now on furosemide 40 mg daily.  Patient has developed A. fib with RVR this morning, agree with IV bolus of amiodarone, will increase p.o. amiodarone to 4 mg p.o. twice daily.  I will also add metoprolol 25 mg p.o. twice daily.  He has not needed pacing over the past 24 hours.  If no further need for pacemaker in the next 24 hours, probably transvenous pacemaker can come out.  Otherwise stable from  cardiac standpoint, if atrial fibrillation persists beyond 8-12 hours today, will start him on anticoagulation with Eliquis or Xarelto.   Adrian Prows, MD, [facility] 03/05/2021, 8:49 AM Office: [phone] Fax: [phone] Pager: [phone]

## 2021-03-05 NOTE — Progress Notes (Signed)
  Transition of Care [facility]) Screening Note   Patient Details  Name: Keith Wagner Date of Birth: Jan 11, 1941   Transition of Care [facility]) CM/SW Contact:    Milas Gain, [place] Phone Number: 03/05/2021, 5:04 PM    Transition of Care Department [facility]) has reviewed patient and no TOC needs have been identified at this time. We will continue to monitor patient advancement through interdisciplinary progression rounds. If new patient transition needs arise, please place a TOC consult.

## 2021-03-05 NOTE — Progress Notes (Signed)
NAME:  Keith Wagner, MRN:  [id number], DOB:  08/28/1940, LOS: 4 ADMISSION DATE:  03/01/2021, CONSULTATION DATE:  03/03/21 REFERRING MD:  [name], MD, CHIEF COMPLAINT:  respiratory failure   History of Present Illness:  Keith Wagner is a 80 year old gentleman with a past medical history of coronary disease and aortic stenosis.  He presented on 12/8 for an elective CABG and aortic valve replacement.  During the surgery he had some episodes of bigeminy intermittently required pacing.  He was ultimately started on amiodarone, and was paced.  Additionally there is a small perforation in the RV wall adjacent to mediastinal chest tube.  This was closed with sutures.  Patient was initially hypotensive but then stabilized after this was closed.  His postoperative course was uncomplicated.  He was extubated.  The evening of 12/9 he developed some episodes of intermittent complete heart block and his pacer wires failed to capture.  This morning he was contacted by Dr. Einar Gip when the patient was found to have progressive hypoxemia on high flow oxygen.  He is in complete heart block.  He was placed on dopamine with somewhat improved capture.  PCCM consulted to assist with respiratory failure and concern for impending intubation. Patient reports breathing more comfortable after transitioning from high flow to bipap. Chest tubes postoperatively have been pulled.  Pertinent  Medical History  Coronary artery disease HTN HLD DM2  Significant Hospital Events: Including procedures, antibiotic start and stop dates in addition to other pertinent events   12/8 CABG x2, AVR with Dr. Roxan Hockey 12/9 complete heart block - treated with dopamine.  12/10 Consult for persistent hypoxia post op.  12/11 Afib with RVR. No further heart block.   Interim History / Subjective:  Remains on HFNC. States dyspnea improving.  Remote smoking, no COPD, no occupational exposures.    Objective   Blood pressure  103/82, pulse (!) 125, temperature 98.3 F (36.8 C), temperature source Oral, resp. rate 17, height 5\' 6"  (1.676 m), weight 87 kg, SpO2 96 %. CVP:  [0 mmHg-19 mmHg] 11 mmHg  Vent Mode: Stand-by FiO2 (%):  [50 %] 50 % Set Rate:  [14 bmp] 14 bmp PEEP:  [5 cmH20] 5 cmH20   Intake/Output Summary (Last 24 hours) at 03/05/2021 1015 Last data filed at 03/05/2021 1000 Gross per 24 hour  Intake 508.1 ml  Output 3055 ml  Net -2546.9 ml    Filed Weights   03/02/21 0500 03/03/21 0500 03/04/21 0500  Weight: 83.5 kg 86 kg 87 kg    Examination: General: resting comfortably nasal cannula Lungs: work of breathing improved, 1000 on I/S. Bronchial breath sounds at both bases.  Cardiovascular: RRR HR in 80s Abdomen: soft, nontender, nondistended Extremities: no edema Neuro: Aox4, generalized weakness.   POC lung ultrasound - bibasilar collapse with moderate effusions.   Ancillary tests personally reviewed   CXR 12/12: bilateral effusions and atelectasis.  Hyponatremia 133 Creatinine improving to 1.29 BNP 549  Assessment & Plan:   Acute hypoxemic and hypercapnic respiratory failure Acute pulmonary edema Critically ill due to Complete Heart Block requiring HR support with dopamine and TVPM.  Atrial fibrillation with RVR POD #4 CABG x2 and AVR  Plan:   - Continue to diurese - excellent response to current dose - Encourage incentive spirometry.  - Wean off dopamine preferentially . Consider starting midodrine if necessary to offset persistent vasoplegia.  - Progressive ambulation.   Best Practice (right click and "Reselect all SmartList Selections" daily)   Diet/type:  NPO DVT prophylaxis: LMWH GI prophylaxis: N/A Lines: Central line and Arterial Line - can remove once pressors are off.  Foley:  Continue while diuresing.  Code Status:  full code Last date of multidisciplinary goals of care discussion [per primary]   Keith Wagner Pulmonary and Critical Care  Medicine 03/05/2021 10:15 AM  Pager: see AMION  If no response to pager , please call critical care on call (see AMION) until 7pm After 7:00 pm call Elink

## 2021-03-06 ENCOUNTER — Inpatient Hospital Stay (HOSPITAL_COMMUNITY): Payer: Medicare HMO

## 2021-03-06 DIAGNOSIS — I495 Sick sinus syndrome: Secondary | ICD-10-CM | POA: Diagnosis not present

## 2021-03-06 DIAGNOSIS — I480 Paroxysmal atrial fibrillation: Secondary | ICD-10-CM | POA: Diagnosis not present

## 2021-03-06 DIAGNOSIS — I5031 Acute diastolic (congestive) heart failure: Secondary | ICD-10-CM | POA: Diagnosis not present

## 2021-03-06 LAB — BASIC METABOLIC PANEL
Anion gap: 5 (ref 5–15)
BUN: 31 mg/dL — ABNORMAL HIGH (ref 8–23)
CO2: 25 mmol/L (ref 22–32)
Calcium: 7.4 mg/dL — ABNORMAL LOW (ref 8.9–10.3)
Chloride: 104 mmol/L (ref 98–111)
Creatinine, Ser: 1.53 mg/dL — ABNORMAL HIGH (ref 0.61–1.24)
GFR, Estimated: 46 mL/min — ABNORMAL LOW (ref >60)
Glucose, Bld: 87 mg/dL (ref 70–99)
Potassium: 3.8 mmol/L (ref 3.5–5.1)
Sodium: 134 mmol/L — ABNORMAL LOW (ref 135–145)

## 2021-03-06 LAB — CBC
HCT: 25 % — ABNORMAL LOW (ref 39.0–52.0)
Hemoglobin: 8 g/dL — ABNORMAL LOW (ref 13.0–17.0)
MCH: 25.3 pg — ABNORMAL LOW (ref 26.0–34.0)
MCHC: 32 g/dL (ref 30.0–36.0)
MCV: 79.1 fL — ABNORMAL LOW (ref 80.0–100.0)
Platelets: 210 10*3/uL (ref 150–400)
RBC: 3.16 MIL/uL — ABNORMAL LOW (ref 4.22–5.81)
RDW: 18.1 % — ABNORMAL HIGH (ref 11.5–15.5)
WBC: 10.8 10*3/uL — ABNORMAL HIGH (ref 4.0–10.5)
nRBC: 0.2 % (ref 0.0–0.2)

## 2021-03-06 LAB — BRAIN NATRIURETIC PEPTIDE: B Natriuretic Peptide: 700.7 pg/mL — ABNORMAL HIGH (ref 0.0–100.0)

## 2021-03-06 LAB — HEPARIN LEVEL (UNFRACTIONATED): Heparin Unfractionated: 0.21 IU/mL — ABNORMAL LOW (ref 0.30–0.70)

## 2021-03-06 LAB — GLUCOSE, CAPILLARY
Glucose-Capillary: 103 mg/dL — ABNORMAL HIGH (ref 70–99)
Glucose-Capillary: 94 mg/dL (ref 70–99)
Glucose-Capillary: 118 mg/dL — ABNORMAL HIGH (ref 70–99)
Glucose-Capillary: 105 mg/dL — ABNORMAL HIGH (ref 70–99)

## 2021-03-06 MED ORDER — LOSARTAN POTASSIUM 25 MG PO TABS
25.0000 mg | ORAL_TABLET | Freq: Every day | ORAL | Status: DC
  Administered 2021-03-06: 25 mg via ORAL
  Filled 2021-03-06: qty 1

## 2021-03-06 MED ORDER — METOPROLOL TARTRATE 12.5 MG HALF TABLET
12.5000 mg | ORAL_TABLET | Freq: Two times a day (BID) | ORAL | Status: DC
  Administered 2021-03-06 – 2021-03-07 (×4): 12.5 mg via ORAL
  Filled 2021-03-06 (×4): qty 1

## 2021-03-06 MED ORDER — AMIODARONE IV BOLUS ONLY 150 MG/100ML
150.0000 mg | Freq: Once | INTRAVENOUS | Status: AC
  Administered 2021-03-06: 150 mg via INTRAVENOUS
  Filled 2021-03-06: qty 100

## 2021-03-06 MED ORDER — HEPARIN (PORCINE) 25000 UT/250ML-% IV SOLN
850.0000 [IU]/h | INTRAVENOUS | Status: DC
Start: 2021-03-06 — End: 2021-03-07
  Administered 2021-03-06: 800 [IU]/h via INTRAVENOUS
  Filled 2021-03-06: qty 250

## 2021-03-06 MED ORDER — APIXABAN 5 MG PO TABS
5.0000 mg | ORAL_TABLET | Freq: Two times a day (BID) | ORAL | Status: DC
Start: 2021-03-06 — End: 2021-03-06

## 2021-03-06 MED ORDER — POTASSIUM CHLORIDE CRYS ER 20 MEQ PO TBCR
40.0000 meq | EXTENDED_RELEASE_TABLET | Freq: Once | ORAL | Status: AC
  Administered 2021-03-06: 40 meq via ORAL
  Filled 2021-03-06: qty 2

## 2021-03-06 MED ORDER — AMIODARONE HCL 200 MG PO TABS
200.0000 mg | ORAL_TABLET | Freq: Every day | ORAL | Status: DC
Start: 2021-03-12 — End: 2021-03-11

## 2021-03-06 MED ORDER — ASPIRIN 81 MG PO CHEW: 81.0000 mg | CHEWABLE_TABLET | Freq: Once | ORAL | Status: DC

## 2021-03-06 NOTE — Progress Notes (Signed)
Pt in no distress requiring bipap at this time.  RT will continue to monitor.

## 2021-03-06 NOTE — Progress Notes (Signed)
Order to discontinue central line. Upon arrival at patient's bedside, spoke to patient's nurse Yvone Neu. He stated he just started heparin via the one PIV the patient currently has in place and just administered an IV bolus of Amiodarone. Due to patient's instability at this time, Yvone Neu verbalized holding off on central line removal. He will contact physician to make aware.

## 2021-03-06 NOTE — Progress Notes (Signed)
ANTICOAGULATION CONSULT NOTE  Pharmacy Consult for Heparin  Indication: atrial fibrillation  No Known Allergies  Patient Measurements: Height: 5\' 6"  (167.6 cm) Weight: 85.2 kg (187 lb 13.3 oz) IBW/kg (Calculated) : 63.8   Vital Signs: Temp: 97.9 F (36.6 C) (12/13 2000) Temp Source: Oral (12/13 2000) BP: 120/77 (12/13 2000) Pulse Rate: 75 (12/13 1700)  Labs: Recent Labs    03/04/21 0445 03/05/21 0423 03/06/21 0303 03/06/21 2018  HGB 8.3* 8.1* 8.0*  --   HCT 25.3* 25.3* 25.0*  --   PLT 168 179 210  --   HEPARINUNFRC  --   --   --  0.21*  CREATININE 1.45* 1.29* 1.53*  --      Estimated Creatinine Clearance: 40.1 mL/min (A) (by C-G formula based on SCr of 1.53 mg/dL (H)).  Assessment: 80yom admitted for planned CABG and bAVR. Post op in and out Afib on amiodarone. Pharmacy consulted to begin heparin drip. Will use low goal to minimize post op bleeding risk.  Hgb stable pltc stable   Heparin level 0.21 this PM  Goal of Therapy:  Heparin level 0.3-0.5 units/ml Monitor platelets by anticoagulation protocol: Yes   Plan:  Heparin drip to 850 uts/hr  Daily CBC and heparin level Monitor s/s bleeding   Thank you Anette Guarneri, PharmD  03/06/2021 9:20 PM

## 2021-03-06 NOTE — Progress Notes (Signed)
Patient ID: Keith Wagner, male   DOB: Aug 10, 1940, 80 y.o.   MRN: [id number] TCTS Evening Rounds:  Hemodynamically stable in sinus rhythm on amiodarone.  Diuresing well  Sats 98% on HFNC

## 2021-03-06 NOTE — Progress Notes (Signed)
BIPAP on stand by.Patient remains on 8L HFNC. No distress noted. Patient comfortable, no complaints of SOB. Declines BIPAP use. RT will monitor as needed.

## 2021-03-06 NOTE — Progress Notes (Signed)
3 Days Post-Op Procedure(s) (LRB): TEMPORARY PACEMAKER (N/A) Subjective: No complaints, up in chair. Ambulated earlier Denies pain and nausea  Objective: Vital signs in last 24 hours: Temp:  [98 F (36.7 C)-98.5 F (36.9 C)] 98.5 F (36.9 C) (12/12 1900) Pulse Rate:  [38-125] 81 (12/13 0700) Cardiac Rhythm: Normal sinus rhythm (12/13 0500) Resp:  [5-26] 23 (12/13 0700) BP: (80-159)/(63-94) 144/81 (12/13 0630) SpO2:  [87 %-100 %] 98 % (12/13 0700) Arterial Line BP: (73-149)/(54-85) 83/76 (12/12 2200) Weight:  [85.2 kg] 85.2 kg (12/13 0500)  Hemodynamic parameters for last 24 hours: CVP:  [8 mmHg-11 mmHg] 10 mmHg  Intake/Output from previous day: 12/12 0701 - 12/13 0700 In: 2093.5 [P.O.:660; I.V.:1280.3; IV Piggyback:153.2] Out: 2400 [Urine:2400] Intake/Output this shift: No intake/output data recorded.  General appearance: alert, cooperative, and no distress Neurologic: intact Heart: regular rate and rhythm Lungs: diminished breath sounds bibasilar Abdomen: normal findings: soft, non-tender Extremities: edema 2+ Wound: clean and dry  Lab Results: Recent Labs    03/05/21 0423 03/06/21 0303  WBC 12.6* 10.8*  HGB 8.1* 8.0*  HCT 25.3* 25.0*  PLT 179 210   BMET:  Recent Labs    03/05/21 0423 03/06/21 0303  NA 133* 134*  K 3.7 3.8  CL 103 104  CO2 24 25  GLUCOSE 96 87  BUN 24* 31*  CREATININE 1.29* 1.53*  CALCIUM 7.3* 7.4*    PT/INR: No results for input(s): LABPROT, INR in the last 72 hours. ABG    Component Value Date/Time   PHART 7.419 03/03/2021 1955   HCO3 22.6 03/03/2021 1955   TCO2 24 03/03/2021 1955   ACIDBASEDEF 2.0 03/03/2021 1955   O2SAT 62.9 03/04/2021 0838   CBG (last 3)  Recent Labs    03/05/21 1649 03/05/21 2118 03/06/21 0624  GLUCAP 88 122* 103*    Assessment/Plan: S/P Procedure(s) (LRB): TEMPORARY PACEMAKER (N/A) Continues to progress CV- in SR this AM on amiodarone and Lopressor  BNP up slightly  Possible dc temp pacing  wire today RESP- bilateral effusions- continue diuresis RENAL- creatinine up slightly with diuresis  Still volume overloaded ENDO- CBG well controlled GI- tolerating diet SCD + enoxaparin for DVT prophylaxis Deconditioning- mobilize as tolerated  LOS: 5 days    [name] 03/06/2021

## 2021-03-06 NOTE — Progress Notes (Signed)
ANTICOAGULATION CONSULT NOTE - Initial Consult  Pharmacy Consult for Heparin  Indication: atrial fibrillation  No Known Allergies  Patient Measurements: Height: 5\' 6"  (167.6 cm) Weight: 85.2 kg (187 lb 13.3 oz) IBW/kg (Calculated) : 63.8   Vital Signs: Temp: 98.3 F (36.8 C) (12/13 1200) Temp Source: Oral (12/13 1200) BP: 136/76 (12/13 0805) Pulse Rate: 75 (12/13 0805)  Labs: Recent Labs    03/04/21 0445 03/05/21 0423 03/06/21 0303  HGB 8.3* 8.1* 8.0*  HCT 25.3* 25.3* 25.0*  PLT 168 179 210  CREATININE 1.45* 1.29* 1.53*    Estimated Creatinine Clearance: 40.1 mL/min (A) (by C-G formula based on SCr of 1.53 mg/dL (H)).   Medical History: Past Medical History:  Diagnosis Date   Arthritis    Asthma    uses inhaler PRN   Bilateral carotid bruits 06/12/2018   Cataract    Diabetes mellitus without complication ([place])    per pt "pre-diabetic"- off Metformin- diet controlled    GERD (gastroesophageal reflux disease)    Gout    Heart murmur    Hypercholesteremia 06/12/2018   Hyperlipidemia    Hypertension    Mild aortic stenosis 06/12/2018     Assessment: 80yom admitted for planned CABG and bAVR. Post op in and out Afib on amiodarone. Pharmacy consulted to begin heparin drip. Will use low goal to minimize post op bleeding risk.  Hgb stable pltc stable    Goal of Therapy:  Heparin level 0.3-0.5 units/ml Monitor platelets by anticoagulation protocol: Yes   Plan:  Heparin drip 800 uts/hr  Heparin level 6hr after start Daily CBC and heparin level Monitor s/s bleeding    Bonnita Nasuti Pharm.D. CPP, BCPS Clinical Pharmacist [phone] 03/06/2021 2:05 PM

## 2021-03-06 NOTE — Progress Notes (Signed)
NAME:  Keith Wagner, MRN:  [id number], DOB:  1940/12/17, LOS: 5 ADMISSION DATE:  03/01/2021, CONSULTATION DATE:  03/03/21 REFERRING MD:  [name], MD, CHIEF COMPLAINT:  respiratory failure   History of Present Illness:  Keith Wagner is a 80 year old gentleman with a past medical history of coronary disease and aortic stenosis.  He presented on 12/8 for an elective CABG and aortic valve replacement.  During the surgery he had some episodes of bigeminy intermittently required pacing.  He was ultimately started on amiodarone, and was paced.  Additionally there is a small perforation in the RV wall adjacent to mediastinal chest tube.  This was closed with sutures.  Patient was initially hypotensive but then stabilized after this was closed.  His postoperative course was uncomplicated.  He was extubated.  The evening of 12/9 he developed some episodes of intermittent complete heart block and his pacer wires failed to capture.  This morning he was contacted by Dr. Einar Gip when the patient was found to have progressive hypoxemia on high flow oxygen.  He is in complete heart block.  He was placed on dopamine with somewhat improved capture.  PCCM consulted to assist with respiratory failure and concern for impending intubation. Patient reports breathing more comfortable after transitioning from high flow to bipap. Chest tubes postoperatively have been pulled.  Pertinent  Medical History  Coronary artery disease HTN HLD DM2  Significant Hospital Events: Including procedures, antibiotic start and stop dates in addition to other pertinent events   12/8 CABG x2, AVR with Dr. Roxan Hockey 12/9 complete heart block - treated with dopamine.  12/10 Consult for persistent hypoxia post op.  12/11 Afib with RVR. No further heart block.  12/12 back in SR.   Interim History / Subjective:  Remains on HFNC. States dyspnea improving.  Remote smoking, no COPD, no occupational exposures.  Now off all  pressors.   Objective   Blood pressure 136/76, pulse 75, temperature 98 F (36.7 C), temperature source Oral, resp. rate 20, height 5\' 6"  (1.676 m), weight 85.2 kg, SpO2 100 %. CVP:  [8 mmHg-10 mmHg] 10 mmHg      Intake/Output Summary (Last 24 hours) at 03/06/2021 0833 Last data filed at 03/06/2021 0600 Gross per 24 hour  Intake 1877.45 ml  Output 2160 ml  Net -282.55 ml    Filed Weights   03/03/21 0500 03/04/21 0500 03/06/21 0500  Weight: 86 kg 87 kg 85.2 kg    Examination: General: resting comfortably nasal cannula Lungs: work of breathing improved, 1000 on I/S. Bronchial breath sounds at both bases is improving. Occasional crackles present.  Good chest excursion without splinting or pain to palpation.  Cardiovascular: RRR HR in 80s - not currently pacing.  Abdomen: soft, nontender, nondistended Extremities: generalized edema, marked scrotal edema.  Neuro: Aox4, generalized weakness.   POC lung ultrasound 12/12 - bibasilar collapse with moderate effusions.   Ancillary tests personally reviewed   CXR 12/12: bilateral effusions and atelectasis.  Hyponatremia improving to 134 Creatinine up to 1.53 BNP increased to 700.7  Assessment & Plan:   Acute hypoxemic and hypercapnic respiratory failure Acute pulmonary edema Was critically ill due to Complete Heart Block requiring HR support with dopamine and TVPM.  Atrial fibrillation with RVR  POD #4 CABG x2 and AVR  Plan:   - Continue to diurese - excellent response to current dose - Encourage incentive spirometry.  - Wean O2  - Progressive ambulation.  Best Practice (right click and "Reselect all SmartList  Selections" daily)   Diet/type: regular diet DVT prophylaxis: LMWH GI prophylaxis: N/A Lines: Central line and Arterial Line - arterial line is out. Introducer can come out if can establish PIV access.  Foley:  Continue while diuresing and until scrotal edema improves  Code Status:  full code Last date of  multidisciplinary goals of care discussion [per primary]   [facility] 03/06/2021 8:33 AM  Pager: see AMION  If no response to pager , please call critical care on call (see AMION) until 7pm After 7:00 pm call Elink

## 2021-03-06 NOTE — Progress Notes (Signed)
Subjective:  Feels well, has been going in and out of A. fib with RVR.  Has not needed pacemaker for the last 48 hours. Intake/Output from previous day:  I/O last 3 completed shifts: In: 2194.7 [P.O.:660; I.V.:1381.4; IV Piggyback:153.2] Out: 3765 [Urine:3765] No intake/output data recorded.  Blood pressure 136/76, pulse 75, temperature 98 F (36.7 C), temperature source Oral, resp. rate 20, height $RemoveBe'5\' 6"'SFEgXzIdy$  (1.676 m), weight 85.2 kg, SpO2 100 %. Physical Exam Vitals reviewed.  Constitutional:      Appearance: He is obese.  HENT:     Head: Normocephalic and atraumatic.  Neck:     Comments: Difficult to make out JVD in view of presence of central lines. Cardiovascular:     Rate and Rhythm: Normal rate and regular rhythm.     Pulses: Intact distal pulses.          Dorsalis pedis pulses are 1+ on the right side and 1+ on the left side.       Posterior tibial pulses are 1+ on the right side and 1+ on the left side.     Heart sounds: S1 normal and S2 normal. Murmur heard.  Harsh systolic murmur is present with a grade of 2/6 at the upper right sternal border.    No gallop.  Pulmonary:     Effort: Pulmonary effort is normal. No respiratory distress.     Breath sounds: No wheezing, rhonchi or rales.  Abdominal:     General: Abdomen is flat. Bowel sounds are normal.     Palpations: Abdomen is soft.  Musculoskeletal:     Right lower leg: No edema.     Left lower leg: No edema.  Neurological:     Mental Status: He is alert.   Lab Results: BMP BNP (last 3 results) Recent Labs    03/04/21 0445 03/05/21 0423 03/06/21 0303  BNP 697.1* 549.2* 700.7*    ProBNP (last 3 results) Recent Labs    12/22/20 0833 01/01/21 0930 01/30/21 0920  PROBNP 1,673* 1,231* 412    BMP Latest Ref Rng & Units 03/06/2021 03/05/2021 03/04/2021  Glucose 70 - 99 mg/dL 87 96 107(H)  BUN 8 - 23 mg/dL 31(H) 24(H) 17  Creatinine 0.61 - 1.24 mg/dL 1.53(H) 1.29(H) 1.45(H)  BUN/Creat Ratio 10 - 24 - - -   Sodium 135 - 145 mmol/L 134(L) 133(L) 133(L)  Potassium 3.5 - 5.1 mmol/L 3.8 3.7 4.1  Chloride 98 - 111 mmol/L 104 103 103  CO2 22 - 32 mmol/L $RemoveB'25 24 24  'ylGzGLHy$ Calcium 8.9 - 10.3 mg/dL 7.4(L) 7.3(L) 7.5(L)   Hepatic Function Latest Ref Rng & Units 02/27/2021 12/08/2020  Total Protein 6.5 - 8.1 g/dL 6.8 6.5  Albumin 3.5 - 5.0 g/dL 3.7 4.1  AST 15 - 41 U/L 22 24  ALT 0 - 44 U/L 18 18  Alk Phosphatase 38 - 126 U/L 59 69  Total Bilirubin 0.3 - 1.2 mg/dL 0.7 0.3   CBC Latest Ref Rng & Units 03/06/2021 03/05/2021 03/04/2021  WBC 4.0 - 10.5 K/uL 10.8(H) 12.6(H) 13.7(H)  Hemoglobin 13.0 - 17.0 g/dL 8.0(L) 8.1(L) 8.3(L)  Hematocrit 39.0 - 52.0 % 25.0(L) 25.3(L) 25.3(L)  Platelets 150 - 400 K/uL 210 179 168   Lipid Panel  No results found for: CHOL, TRIG, HDL, CHOLHDL, VLDL, LDLCALC, LDLDIRECT Cardiac Panel (last 3 results) No results for input(s): CKTOTAL, CKMB, TROPONINI, RELINDX in the last 72 hours.  HEMOGLOBIN A1C Lab Results  Component Value Date   HGBA1C 6.3 (H) 02/27/2021  MPG 134.11 02/27/2021   TSH No results for input(s): TSH in the last 8760 hours.  Imaging: Imaging results have been reviewed  Cardiac Studies: Left Heart Catheterization 02/06/2021: LM: Distal calcific 75% stenosis LAD: Mid 80% stenosis LCX: large vessel. Ostial 70% stenosis.  RCA: Dominant, but small vessel. No significant disease seen on well opacified non-selective angiogram.   Normal PCW Unable to cross aortic valve due to severe aorta tortuosity.  1. Left ventricular ejection fraction, by estimation, is 50 to 55%. Left ventricular ejection fraction by PLAX is 45 %. The left ventricle has low normal function. The left ventricle has no regional wall motion abnormalities. There is mild left  ventricular hypertrophy. Left ventricular diastolic parameters are consistent with Grade II diastolic dysfunction (pseudonormalization). Elevated left ventricular end-diastolic pressure.  2. Right ventricular  systolic function is normal. The right ventricular size is normal.  3. Left atrial size was moderately dilated.  4. The mitral valve was not well visualized. Trivial mitral valve regurgitation.  5. The aortic valve was not well visualized. Aortic valve regurgitation is not visualized. Mild aortic valve stenosis. There is a 21 mm Edwards Edwards InspirEase Resilia valve present in the aortic position. Aortic valve area, by VTI measures 0.98 cm.  Aortic valve mean gradient measures 17.5 mmHg. Aortic valve Vmax measures 2.77 m/s.  6. The inferior vena cava is dilated in size with <50% respiratory variability, suggesting right atrial pressure of 15 mmHg.   CABG x2 on 03/01/2021: LIMA to LAD and SVG to OM and aortic valve replacement with a 21 mm Edwards InspirEase Resilia bioprosthetic valve.  Temporary Pacer Placement 12/102022:  Left IJ vein access uner ultrasound guidance.  Tempo balloontipped pacemaker was introduced into the right ventricular apex under direct visualization, the prongs were deployed and balloon deflated and V pacing obtained at MA of 1.0. Pacemaker left at demand VVI pace at 80 bpm with a MA of 5.0.  Patient tolerated procedure.  No immediate complication.  Echocardiogram 03/03/2021:   1. Left ventricular ejection fraction, by estimation, is 50 to 55%. Left  ventricular ejection fraction by PLAX is 45 %. The left ventricle has low  normal function. The left ventricle has no regional wall motion  abnormalities. There is mild left  ventricular hypertrophy. Left ventricular diastolic parameters are  consistent with Grade II diastolic dysfunction (pseudonormalization).  Elevated left ventricular end-diastolic pressure.   2. Right ventricular systolic function is normal. The right ventricular  size is normal.   3. Left atrial size was moderately dilated.   4. The mitral valve was not well visualized. Trivial mitral valve  regurgitation.   5. The aortic valve was not well  visualized. Aortic valve regurgitation  is not visualized. Mild aortic valve stenosis. There is a 21 mm Edwards  Edwards InspirEase Resilia valve present in the aortic position. Aortic  valve area, by VTI measures 0.98 cm.   Aortic valve mean gradient measures 17.5 mmHg. Aortic valve Vmax measures  2.77 m/s.   6. The inferior vena cava is dilated in size with <50% respiratory  variability, suggesting right atrial pressure of 15 mmHg.   EKG:  EKG 03/03/2021: Episodes of sinus arrest with junctional escape, PACs.  Nonspecific T abnormality.   EKG 03/02/2021: Normal sinus rhythm at rate of 76 bpm, normal axis, nonspecific T abnormality.   Scheduled Meds:  acetaminophen  1,000 mg Oral Q6H   Or   acetaminophen (TYLENOL) oral liquid 160 mg/5 mL  1,000 mg Per Tube Q6H   [  START ON 03/12/2021] amiodarone  200 mg Oral Daily   amiodarone  400 mg Oral BID   apixaban  5 mg Oral BID   aspirin  324 mg Per Tube Daily   [START ON 03/07/2021] aspirin  81 mg Oral Once   atorvastatin  40 mg Oral Daily   bisacodyl  10 mg Oral Daily   Or   bisacodyl  10 mg Rectal Daily   chlorhexidine gluconate (MEDLINE KIT)  15 mL Mouth Rinse BID   Chlorhexidine Gluconate Cloth  6 each Topical Daily   docusate sodium  200 mg Oral Daily   furosemide  60 mg Intravenous BID   insulin aspart  0-15 Units Subcutaneous TID WC   insulin detemir  15 Units Subcutaneous Daily   magnesium oxide  400 mg Oral Daily   mouth rinse  15 mL Mouth Rinse QID   metoprolol tartrate  12.5 mg Oral BID   pantoprazole  40 mg Oral Daily   sodium chloride flush  10-40 mL Intracatheter Q12H   sodium chloride flush  3 mL Intravenous Q12H   Continuous Infusions:  sodium chloride Stopped (03/02/21 0801)   sodium chloride     sodium chloride 20 mL/hr at 03/01/21 1512   albumin human     lactated ringers     lactated ringers 10 mL/hr at 03/01/21 0747   lactated ringers Stopped (03/01/21 1737)   lactated ringers Stopped (03/02/21 2150)    phenylephrine (NEO-SYNEPHRINE) Adult infusion Stopped (03/05/21 1440)   PRN Meds:.sodium chloride, albuterol, calcium carbonate, lactated ringers, metoprolol tartrate, ondansetron (ZOFRAN) IV, oxyCODONE, sodium chloride flush, sodium chloride flush, traMADol  Assessment/Plan:  Keith Wagner is a 80 y.o. Caucasian male patient with aortic stenosis, hypertension, type 2 diabetes mellitus, hyperlipidemia who underwent coronary angiography on 02/06/2021 for evaluation of dyspnea and aortic stenosis, found to have calcific distal left main 45% stenosis and mid LAD 80% and ostial circumflex 70% stenosis for which he underwent CABG x2 with LIMA to LAD and SVG to OM and aortic valve replacement with a 21 mm Edwards InspirEase Resilia bioprosthetic valve on 03/01/2021. He subsequently developed episodes of heart block and worsening renal function.   1.  Acute diastolic heart failure 2.  Sinus node dysfunction and paroxysmal atrial fibrillation, postop 3. Nonischemic cardiomyopathy, left main disease SP CABG and aortic valve replacement for moderate aortic stenosis, symptomatic on 03/01/2021.  EF prior to CABG was 35%, recent echo revealing at least a low normal LVEF. 4.  Chronic renal failure stage IIIa, stable.    Recommendation: Patient is doing well, has not needed temporary pacemaker.  Hence I reviewed telemetry and discontinue the temporary transvenous pacemaker.  I will add low-dose of beta-blocker, metoprolol tartrate 12.5 mg twice daily both for CAD, A. fib with RVR.  Continue amiodarone.  As patient has had several episodes of A. fib, extremely high risk of stroke in view of persistent atrial fibrillation in the paroxysmal manner, discussed with Dr. Roxan Hockey, will start him on IV heparin for now until the pericardial pacer wires are intact.  Continue mobilization of fluid, still has significant amount of third spaced fluid significant scrotal edema and evidence of heart failure.  I will start him  on losartan 25 mg daily.  Follow-up on serum creatinine.   Adrian Prows, MD, [facility] 03/06/2021, 11:33 AM Office: [phone] Fax: [phone] Pager: [phone]

## 2021-03-07 ENCOUNTER — Other Ambulatory Visit (HOSPITAL_COMMUNITY): Payer: Self-pay

## 2021-03-07 ENCOUNTER — Inpatient Hospital Stay (HOSPITAL_COMMUNITY): Payer: Medicare HMO

## 2021-03-07 LAB — BASIC METABOLIC PANEL
Anion gap: 4 — ABNORMAL LOW (ref 5–15)
BUN: 30 mg/dL — ABNORMAL HIGH (ref 8–23)
CO2: 27 mmol/L (ref 22–32)
Calcium: 7.8 mg/dL — ABNORMAL LOW (ref 8.9–10.3)
Chloride: 105 mmol/L (ref 98–111)
Creatinine, Ser: 1.39 mg/dL — ABNORMAL HIGH (ref 0.61–1.24)
GFR, Estimated: 52 mL/min — ABNORMAL LOW (ref >60)
Glucose, Bld: 87 mg/dL (ref 70–99)
Potassium: 3.8 mmol/L (ref 3.5–5.1)
Sodium: 136 mmol/L (ref 135–145)

## 2021-03-07 LAB — CBC
HCT: 25.2 % — ABNORMAL LOW (ref 39.0–52.0)
Hemoglobin: 8 g/dL — ABNORMAL LOW (ref 13.0–17.0)
MCH: 25.4 pg — ABNORMAL LOW (ref 26.0–34.0)
MCHC: 31.7 g/dL (ref 30.0–36.0)
MCV: 80 fL (ref 80.0–100.0)
Platelets: 252 10*3/uL (ref 150–400)
RBC: 3.15 MIL/uL — ABNORMAL LOW (ref 4.22–5.81)
RDW: 18.4 % — ABNORMAL HIGH (ref 11.5–15.5)
WBC: 9.5 10*3/uL (ref 4.0–10.5)
nRBC: 0.4 % — ABNORMAL HIGH (ref 0.0–0.2)

## 2021-03-07 LAB — HEPARIN LEVEL (UNFRACTIONATED): Heparin Unfractionated: 0.43 IU/mL (ref 0.30–0.70)

## 2021-03-07 LAB — BRAIN NATRIURETIC PEPTIDE: B Natriuretic Peptide: 694.7 pg/mL — ABNORMAL HIGH (ref 0.0–100.0)

## 2021-03-07 LAB — GLUCOSE, CAPILLARY
Glucose-Capillary: 105 mg/dL — ABNORMAL HIGH (ref 70–99)
Glucose-Capillary: 91 mg/dL (ref 70–99)
Glucose-Capillary: 102 mg/dL — ABNORMAL HIGH (ref 70–99)
Glucose-Capillary: 118 mg/dL — ABNORMAL HIGH (ref 70–99)

## 2021-03-07 LAB — MAGNESIUM: Magnesium: 2.2 mg/dL (ref 1.7–2.4)

## 2021-03-07 MED ORDER — APIXABAN 5 MG PO TABS
5.0000 mg | ORAL_TABLET | Freq: Two times a day (BID) | ORAL | Status: DC
  Administered 2021-03-07 – 2021-03-12 (×10): 5 mg via ORAL
  Filled 2021-03-07 (×10): qty 1

## 2021-03-07 MED ORDER — SACUBITRIL-VALSARTAN 24-26 MG PO TABS
1.0000 | ORAL_TABLET | Freq: Two times a day (BID) | ORAL | Status: DC
  Administered 2021-03-07 – 2021-03-11 (×9): 1 via ORAL
  Filled 2021-03-07 (×10): qty 1

## 2021-03-07 MED ORDER — ASPIRIN 81 MG PO CHEW
324.0000 mg | CHEWABLE_TABLET | Freq: Every day | ORAL | Status: DC
  Administered 2021-03-07: 10:00:00 324 mg via ORAL
  Filled 2021-03-07: qty 4

## 2021-03-07 MED ORDER — ASPIRIN 81 MG PO CHEW
81.0000 mg | CHEWABLE_TABLET | Freq: Every day | ORAL | Status: DC
  Administered 2021-03-08 – 2021-03-12 (×5): 81 mg via ORAL
  Filled 2021-03-07 (×5): qty 1

## 2021-03-07 MED ORDER — POTASSIUM CHLORIDE CRYS ER 20 MEQ PO TBCR
40.0000 meq | EXTENDED_RELEASE_TABLET | Freq: Once | ORAL | Status: AC
Start: 2021-03-07 — End: 2021-03-07
  Administered 2021-03-07: 18:00:00 40 meq via ORAL
  Filled 2021-03-07: qty 2

## 2021-03-07 MED ORDER — FUROSEMIDE 10 MG/ML IJ SOLN: 40.0000 mg | Freq: Every day | INTRAMUSCULAR | Status: DC

## 2021-03-07 MED ORDER — FUROSEMIDE 10 MG/ML IJ SOLN
40.0000 mg | Freq: Once | INTRAMUSCULAR | Status: AC
  Administered 2021-03-07: 17:00:00 40 mg via INTRAVENOUS
  Filled 2021-03-07: qty 4

## 2021-03-07 MED FILL — Magnesium Sulfate Inj 50%: INTRAMUSCULAR | Qty: 10 | Status: AC

## 2021-03-07 MED FILL — Electrolyte-R (PH 7.4) Solution: INTRAVENOUS | Qty: 5000 | Status: AC

## 2021-03-07 MED FILL — Potassium Chloride Inj 2 mEq/ML: INTRAVENOUS | Qty: 40 | Status: AC

## 2021-03-07 MED FILL — Heparin Sodium (Porcine) Inj 1000 Unit/ML: INTRAMUSCULAR | Qty: 20 | Status: AC

## 2021-03-07 MED FILL — Sodium Bicarbonate IV Soln 8.4%: INTRAVENOUS | Qty: 50 | Status: AC

## 2021-03-07 MED FILL — Heparin Sodium (Porcine) Inj 1000 Unit/ML: Qty: 1000 | Status: AC

## 2021-03-07 MED FILL — Sodium Chloride IV Soln 0.9%: INTRAVENOUS | Qty: 2000 | Status: AC

## 2021-03-07 MED FILL — Lidocaine HCl Local Preservative Free (PF) Inj 2%: INTRAMUSCULAR | Qty: 15 | Status: AC

## 2021-03-07 MED FILL — Mannitol IV Soln 20%: INTRAVENOUS | Qty: 500 | Status: AC

## 2021-03-07 NOTE — Progress Notes (Signed)
Patient was not seen by me today, plan is to continue diuresis, pericardial pacemaker leads will be off today, patient will be started on Eliquis this evening as per Dr. Roxan Hockey and if patient continues to maintain sinus rhythm we can certainly discontinue this.  For now continue aggressive diuresis and pulmonary toilet.

## 2021-03-07 NOTE — Plan of Care (Signed)

## 2021-03-07 NOTE — Progress Notes (Addendum)
° °   °  [address]       [place] [address]             [phone]      Back in atrial fibrillation with rate 120  BP 133/78 (BP Location: Right Arm)    Pulse 67    Temp 97.8 F (36.6 C)    Resp (!) 22    Ht 5\' 6"  (1.676 m)    Wt 83.6 kg    SpO2 97%    BMI 29.75 kg/m    Intake/Output Summary (Last 24 hours) at 03/07/2021 1753 Last data filed at 03/07/2021 1600 Gross per 24 hour  Intake 820.08 ml  Output 2900 ml  Net -2079.92 ml   Pacing wires removed earlier today  I/o even so far today  Will start Eliquis this evening  Remo Lipps C. Roxan Hockey, MD [facility] [phone]

## 2021-03-07 NOTE — Progress Notes (Signed)
NAME:  Keith Wagner, MRN:  [id number], DOB:  05-21-1940, LOS: 6 ADMISSION DATE:  03/01/2021, CONSULTATION DATE:  03/03/21 REFERRING MD:  [name], MD, CHIEF COMPLAINT:  respiratory failure   History of Present Illness:  Keith Wagner is a 80 year old gentleman with a past medical history of coronary disease and aortic stenosis.  He presented on 12/8 for an elective CABG and aortic valve replacement.  During the surgery he had some episodes of bigeminy intermittently required pacing.  He was ultimately started on amiodarone, and was paced.  Additionally there is a small perforation in the RV wall adjacent to mediastinal chest tube.  This was closed with sutures.  Patient was initially hypotensive but then stabilized after this was closed.  His postoperative course was uncomplicated.  He was extubated.  The evening of 12/9 he developed some episodes of intermittent complete heart block and his pacer wires failed to capture.  This morning he was contacted by Dr. Einar Gip when the patient was found to have progressive hypoxemia on high flow oxygen.  He is in complete heart block.  He was placed on dopamine with somewhat improved capture.  PCCM consulted to assist with respiratory failure and concern for impending intubation. Patient reports breathing more comfortable after transitioning from high flow to bipap. Chest tubes postoperatively have been pulled.  Pertinent  Medical History  Coronary artery disease HTN HLD DM2  Significant Hospital Events: Including procedures, antibiotic start and stop dates in addition to other pertinent events   12/8 CABG x2, AVR with Dr. Roxan Hockey 12/9 complete heart block - treated with dopamine.  12/10 Consult for persistent hypoxia post op.  12/11 Afib with RVR. No further heart block.  12/12 back in SR.   Interim History / Subjective:  Great response to diuretics yesterday. Now down to 2lpm. Patient feels less swollen.  Objective   Blood  pressure 121/75, pulse 69, temperature 98 F (36.7 C), temperature source Oral, resp. rate 18, height 5\' 6"  (1.676 m), weight 83.6 kg, SpO2 97 %. CVP:  [7 mmHg-9 mmHg] 9 mmHg      Intake/Output Summary (Last 24 hours) at 03/07/2021 0845 Last data filed at 03/07/2021 0715 Gross per 24 hour  Intake 888.73 ml  Output 2450 ml  Net -1561.27 ml    Filed Weights   03/04/21 0500 03/06/21 0500 03/07/21 0500  Weight: 87 kg 85.2 kg 83.6 kg    Examination: General: resting comfortably nasal cannula Lungs: work of breathing improved, 1000 on I/S. Bronchial breath sounds at both bases has resolved.  Occasional crackles present.  Good chest excursion without splinting or pain to palpation.  Cardiovascular: RRR HR in 80s - not currently pacing.  Abdomen: soft, nontender, nondistended Extremities: generalized edema, marked scrotal edema.  Neuro: Aox4, generalized weakness.   POC lung ultrasound 12/12 - bibasilar collapse with moderate effusions.   Ancillary tests personally reviewed   CXR 12/12: bilateral effusions and atelectasis.  Hyponatremia improving to 134 Creatinine up to 1.53 BNP down but still elevated to 694.  Assessment & Plan:   Acute hypoxemic and hypercapnic respiratory failure Acute pulmonary edema Was critically ill due to Complete Heart Block requiring HR support with dopamine and TVPM.  Atrial fibrillation with RVR  POD #4 CABG x2 and AVR  Plan:   - Continue to diurese - try lower dose - Encourage incentive spirometry.  - Wean O2  - Progressive ambulation. - TCTS to pull pacing wires this afternoon - start Eliquis this evening.  Best Practice (right click and "Reselect all SmartList Selections" daily)   Diet/type: regular diet DVT prophylaxis: convert to eliquis once pacing wires out.  GI prophylaxis: N/A Lines: Central line and Arterial Line - arterial line is out. Introducer can come out.  Foley:  Continue while diuresing and until scrotal edema improves   Code Status:  full code Last date of multidisciplinary goals of care discussion [per primary]   [facility] 03/07/2021 8:45 AM  Pager: see AMION  If no response to pager , please call critical care on call (see AMION) until 7pm After 7:00 pm call Elink

## 2021-03-07 NOTE — TOC Benefit Eligibility Note (Addendum)
Patient Teacher, English as a foreign language completed.    The patient is currently admitted and upon discharge could be taking Entresto 24-26 mg.  The current 30 day co-pay is, $103.68 due to Coverage Gap (donut hole).   The patient is currently admitted and upon discharge could be taking Eliquis 5 mg.  The current 30 day co-pay is, $75.02 due to Coverage Gap (donut hole).   The patient is insured through [employer], [place] Patient Advocate Specialist [place] Patient Advocate Team Direct Number: [phone]  Fax: [phone]

## 2021-03-07 NOTE — Progress Notes (Addendum)
4 Days Post-Op Procedure(s) (LRB): TEMPORARY PACEMAKER (N/A) Subjective: No complaints this AM, already ambulated  Objective: Vital signs in last 24 hours: Temp:  [97.4 F (36.3 C)-98.4 F (36.9 C)] 98 F (36.7 C) (12/14 0700) Pulse Rate:  [74-120] 74 (12/13 2132) Cardiac Rhythm: Normal sinus rhythm (12/14 0400) Resp:  [16-24] 22 (12/14 0700) BP: (97-155)/(49-91) 152/87 (12/14 0700) SpO2:  [94 %-100 %] 98 % (12/14 0700) Weight:  [83.6 kg] 83.6 kg (12/14 0500)  Hemodynamic parameters for last 24 hours: CVP:  [7 mmHg-9 mmHg] 8 mmHg  Intake/Output from previous day: 12/13 0701 - 12/14 0700 In: 948.7 [P.O.:720; I.V.:228.7] Out: 3850 [Urine:3850] Intake/Output this shift: Total I/O In: 240 [P.O.:240] Out: -   General appearance: alert, cooperative, and no distress Neurologic: intact Heart: regular rate and rhythm Lungs: diminished breath sounds bibasilar Abdomen: normal findings: soft, non-tender Wound: clean and dry  Lab Results: Recent Labs    03/06/21 0303 03/07/21 0334  WBC 10.8* 9.5  HGB 8.0* 8.0*  HCT 25.0* 25.2*  PLT 210 252   BMET:  Recent Labs    03/06/21 0303 03/07/21 0334  NA 134* 136  K 3.8 3.8  CL 104 105  CO2 25 27  GLUCOSE 87 87  BUN 31* 30*  CREATININE 1.53* 1.39*  CALCIUM 7.4* 7.8*    PT/INR: No results for input(s): LABPROT, INR in the last 72 hours. ABG    Component Value Date/Time   PHART 7.419 03/03/2021 1955   HCO3 22.6 03/03/2021 1955   TCO2 24 03/03/2021 1955   ACIDBASEDEF 2.0 03/03/2021 1955   O2SAT 62.9 03/04/2021 0838   CBG (last 3)  Recent Labs    03/06/21 1601 03/06/21 2129 03/07/21 0652  GLUCAP 118* 105* 105*    Assessment/Plan: S/P Procedure(s) (LRB): TEMPORARY PACEMAKER (N/A) - Continues to progress NEURO- intact CV- in SR this am, continues to go in and out of A fib  On heparin drip, will convert to Eliquis once pacing wires out RESP- down to 2L [place], CXR no change bilateral effusions RENAL- creatinine  down to 1.4, 3L - yesterday ENDO- CBG well controlled Anemia-stable Continue cardiac rehab  LOS: 6 days    [name] 03/07/2021 Will hold heparin this AM and remove pacing wires this afternoon  Remo Lipps C. Roxan Hockey, MD [facility] [phone]

## 2021-03-07 NOTE — Progress Notes (Signed)
ANTICOAGULATION CONSULT NOTE  Pharmacy Consult for Heparin  Indication: atrial fibrillation  No Known Allergies  Patient Measurements: Height: 5\' 6"  (167.6 cm) Weight: 83.6 kg (184 lb 4.9 oz) IBW/kg (Calculated) : 63.8   Vital Signs: Temp: 98 F (36.7 C) (12/14 0700) Temp Source: Oral (12/14 0700) BP: 121/75 (12/14 0800) Pulse Rate: 69 (12/14 0800)  Labs: Recent Labs    03/05/21 0423 03/06/21 0303 03/06/21 2018 03/07/21 0334  HGB 8.1* 8.0*  --  8.0*  HCT 25.3* 25.0*  --  25.2*  PLT 179 210  --  252  HEPARINUNFRC  --   --  0.21* 0.43  CREATININE 1.29* 1.53*  --  1.39*     Estimated Creatinine Clearance: 43.7 mL/min (A) (by C-G formula based on SCr of 1.39 mg/dL (H)).  Assessment: 80yom admitted for planned CABG and bAVR. Post op in and out Afib on amiodarone. Pharmacy consulted to begin heparin drip.  Heparin level this morning came back therapeutic at 0.43, on 850 units/hr. Hgb 8, plt 252. No s/sx of bleeding or infusion issues.  Goal of Therapy:  Heparin level 0.3-0.5 units/ml Monitor platelets by anticoagulation protocol: Yes   Plan:  Heparin infusion on hold currently for pacing wire pull this afternoon Plan for apixaban if stable later this evening - pharmacy will continue to follow for [facility] plan  Thank you Antonietta Jewel, PharmD, BCCCP Clinical Pharmacist  Phone: [phone] 03/07/2021 9:00 AM  Please check AMION for all [place] phone numbers After 10:00 PM, call [place] [phone]

## 2021-03-08 DIAGNOSIS — I5031 Acute diastolic (congestive) heart failure: Secondary | ICD-10-CM

## 2021-03-08 DIAGNOSIS — I480 Paroxysmal atrial fibrillation: Secondary | ICD-10-CM

## 2021-03-08 DIAGNOSIS — N1832 Chronic kidney disease, stage 3b: Secondary | ICD-10-CM

## 2021-03-08 LAB — GLUCOSE, CAPILLARY
Glucose-Capillary: 103 mg/dL — ABNORMAL HIGH (ref 70–99)
Glucose-Capillary: 108 mg/dL — ABNORMAL HIGH (ref 70–99)
Glucose-Capillary: 175 mg/dL — ABNORMAL HIGH (ref 70–99)
Glucose-Capillary: 148 mg/dL — ABNORMAL HIGH (ref 70–99)

## 2021-03-08 LAB — BASIC METABOLIC PANEL
Anion gap: 6 (ref 5–15)
BUN: 26 mg/dL — ABNORMAL HIGH (ref 8–23)
CO2: 27 mmol/L (ref 22–32)
Calcium: 8 mg/dL — ABNORMAL LOW (ref 8.9–10.3)
Chloride: 103 mmol/L (ref 98–111)
Creatinine, Ser: 1.4 mg/dL — ABNORMAL HIGH (ref 0.61–1.24)
GFR, Estimated: 51 mL/min — ABNORMAL LOW (ref >60)
Glucose, Bld: 119 mg/dL — ABNORMAL HIGH (ref 70–99)
Potassium: 4.4 mmol/L (ref 3.5–5.1)
Sodium: 136 mmol/L (ref 135–145)

## 2021-03-08 LAB — MAGNESIUM: Magnesium: 2.2 mg/dL (ref 1.7–2.4)

## 2021-03-08 LAB — BRAIN NATRIURETIC PEPTIDE: B Natriuretic Peptide: 767.4 pg/mL — ABNORMAL HIGH (ref 0.0–100.0)

## 2021-03-08 MED ORDER — ~~LOC~~ CARDIAC SURGERY, PATIENT & FAMILY EDUCATION
Freq: Once | Status: AC
  Administered 2021-03-08: 1

## 2021-03-08 MED ORDER — SODIUM CHLORIDE 0.9% FLUSH
3.0000 mL | Freq: Two times a day (BID) | INTRAVENOUS | Status: DC
  Administered 2021-03-09 – 2021-03-11 (×6): 3 mL via INTRAVENOUS

## 2021-03-08 MED ORDER — DAPAGLIFLOZIN PROPANEDIOL 10 MG PO TABS
10.0000 mg | ORAL_TABLET | Freq: Every day | ORAL | Status: DC
  Administered 2021-03-09 – 2021-03-12 (×4): 10 mg via ORAL
  Filled 2021-03-08 (×4): qty 1

## 2021-03-08 MED ORDER — SODIUM CHLORIDE 0.9% FLUSH: 3.0000 mL | INTRAVENOUS | Status: DC | PRN

## 2021-03-08 MED ORDER — METOPROLOL TARTRATE 25 MG PO TABS
25.0000 mg | ORAL_TABLET | Freq: Two times a day (BID) | ORAL | Status: DC
  Administered 2021-03-08 – 2021-03-12 (×9): 25 mg via ORAL
  Filled 2021-03-08 (×9): qty 1

## 2021-03-08 MED ORDER — FUROSEMIDE 40 MG PO TABS
40.0000 mg | ORAL_TABLET | Freq: Two times a day (BID) | ORAL | Status: DC
  Administered 2021-03-08 – 2021-03-09 (×4): 40 mg via ORAL
  Filled 2021-03-08 (×4): qty 1

## 2021-03-08 MED ORDER — ACETAMINOPHEN 500 MG PO TABS
1000.0000 mg | ORAL_TABLET | Freq: Four times a day (QID) | ORAL | Status: DC | PRN
  Administered 2021-03-09 (×2): 1000 mg via ORAL
  Filled 2021-03-08 (×2): qty 2

## 2021-03-08 MED ORDER — SPIRONOLACTONE 12.5 MG HALF TABLET
12.5000 mg | ORAL_TABLET | Freq: Every day | ORAL | Status: DC
  Administered 2021-03-08 – 2021-03-09 (×2): 12.5 mg via ORAL
  Filled 2021-03-08 (×2): qty 1

## 2021-03-08 MED ORDER — SODIUM CHLORIDE 0.9 % IV SOLN: 250.0000 mL | INTRAVENOUS | Status: DC | PRN

## 2021-03-08 MED ORDER — POTASSIUM CHLORIDE CRYS ER 20 MEQ PO TBCR
20.0000 meq | EXTENDED_RELEASE_TABLET | Freq: Every day | ORAL | Status: DC
  Administered 2021-03-08: 20 meq via ORAL
  Filled 2021-03-08: qty 1

## 2021-03-08 NOTE — Progress Notes (Signed)
Pt resting comfortably with stable VS. No bipap needed at this time.  

## 2021-03-08 NOTE — Progress Notes (Signed)
CARDIAC REHAB PHASE I   Offered to walk with pt. Pt just returned from walk with RN. Pt states increasing strength. Questions/concerns addressed. Encouraged continued ambulation, sternal precautions, and IS use. Will continue to follow.  [id number] Rufina Falco, RN BSN 03/08/2021 1:11 PM

## 2021-03-08 NOTE — Progress Notes (Signed)
NAME:  Keith Wagner, MRN:  [id number], DOB:  05-19-1940, LOS: 7 ADMISSION DATE:  03/01/2021, CONSULTATION DATE:  03/03/21 REFERRING MD:  [name], MD, CHIEF COMPLAINT:  respiratory failure   History of Present Illness:  Keith Wagner is a 80 year old gentleman with a past medical history of coronary disease and aortic stenosis.  He presented on 12/8 for an elective CABG and aortic valve replacement.  During the surgery he had some episodes of bigeminy intermittently required pacing.  He was ultimately started on amiodarone, and was paced.  Additionally there is a small perforation in the RV wall adjacent to mediastinal chest tube.  This was closed with sutures.  Patient was initially hypotensive but then stabilized after this was closed.  His postoperative course was uncomplicated.  He was extubated.  The evening of 12/9 he developed some episodes of intermittent complete heart block and his pacer wires failed to capture.  This morning he was contacted by Dr. Einar Gip when the patient was found to have progressive hypoxemia on high flow oxygen.  He is in complete heart block.  He was placed on dopamine with somewhat improved capture.  PCCM consulted to assist with respiratory failure and concern for impending intubation. Patient reports breathing more comfortable after transitioning from high flow to bipap. Chest tubes postoperatively have been pulled.  Pertinent  Medical History  Coronary artery disease HTN HLD DM2  Significant Hospital Events: Including procedures, antibiotic start and stop dates in addition to other pertinent events   12/8 CABG x2, AVR with Dr. Roxan Hockey 12/9 complete heart block - treated with dopamine.  12/10 Consult for persistent hypoxia post op.  12/11 Afib with RVR. No further heart block.  12/12 back in SR.   Interim History / Subjective:   In and out of Afib, currently in SR.   Objective   Blood pressure 130/78, pulse (!) 126, temperature 98.1  F (36.7 C), temperature source Oral, resp. rate 20, height 5\' 6"  (1.676 m), weight 81.5 kg, SpO2 95 %. CVP:  [2 mmHg-10 mmHg] 10 mmHg      Intake/Output Summary (Last 24 hours) at 03/08/2021 0900 Last data filed at 03/08/2021 0800 Gross per 24 hour  Intake 971.35 ml  Output 2525 ml  Net -1553.65 ml    Filed Weights   03/06/21 0500 03/07/21 0500 03/08/21 0500  Weight: 85.2 kg 83.6 kg 81.5 kg    Examination: General: resting comfortably nasal cannula Lungs: work of breathing improved, 1000 on I/S. Bronchial breath sounds at both bases has resolved.  Clear bilaterally.  Good chest excursion without splinting or pain to palpation.  Cardiovascular: RRR HR in 80s Abdomen: soft, nontender, nondistended Extremities: generalized edema, marked scrotal edema.  Neuro: Aox4, generalized weakness.    Ancillary tests personally reviewed   CXR 12/12: bilateral effusions and atelectasis.  Hyponatremia improving to 134 Creatinine up to 1.40 BNP down but still elevated to 767.4  Assessment & Plan:   Acute hypoxemic and hypercapnic respiratory failure Acute pulmonary edema Was critically ill due to Complete Heart Block requiring HR support with dopamine and TVPM.  Atrial fibrillation with RVR  POD #4 CABG x2 and AVR  Plan:   - Continue to diurese - - Encourage incentive spirometry.  - Wean O2  - Progressive ambulation. - Amiodarone and eliquis for Afib.   CCM will sign off. Please call back if questions arise.   Best Practice (right click and "Reselect all SmartList Selections" daily)   Diet/type: regular diet DVT  prophylaxis: convert to eliquis once pacing wires out.  GI prophylaxis: N/A Lines: Central line and Arterial Line - arterial line is out. Introducer can come out.  Foley:  Continue while diuresing and until scrotal edema improves  Code Status:  full code Last date of multidisciplinary goals of care discussion [per primary]   [facility] 03/08/2021 9:00 AM  Pager: see AMION  If no response to pager , please call critical care on call (see AMION) until 7pm After 7:00 pm call Elink

## 2021-03-08 NOTE — Progress Notes (Signed)
5 Days Post-Op Procedure(s) (LRB): TEMPORARY PACEMAKER (N/A) Subjective: No complaints this AM  Objective: Vital signs in last 24 hours: Temp:  [97.8 F (36.6 C)-98.6 F (37 C)] 98 F (36.7 C) (12/15 0400) Pulse Rate:  [67-126] 126 (12/14 2145) Cardiac Rhythm: Normal sinus rhythm (12/15 0415) Resp:  [15-24] 22 (12/15 0600) BP: (81-146)/(59-91) 117/70 (12/15 0500) SpO2:  [91 %-100 %] 99 % (12/15 0600) Weight:  [81.5 kg] 81.5 kg (12/15 0500)  Hemodynamic parameters for last 24 hours: CVP:  [2 mmHg-10 mmHg] 10 mmHg  Intake/Output from previous day: 12/14 0701 - 12/15 0700 In: 1071.4 [P.O.:1060; I.V.:11.4] Out: 2525 [Urine:2525] Intake/Output this shift: No intake/output data recorded.  General appearance: alert, cooperative, and no distress Neurologic: intact Heart: regular rate and rhythm Lungs: diminished breath sounds bibasilar Abdomen: normal findings: soft, non-tender Extremities: edema decreased Wound: clean and dry  Lab Results: Recent Labs    03/06/21 0303 03/07/21 0334  WBC 10.8* 9.5  HGB 8.0* 8.0*  HCT 25.0* 25.2*  PLT 210 252   BMET:  Recent Labs    03/07/21 0334 03/08/21 0119  NA 136 136  K 3.8 4.4  CL 105 103  CO2 27 27  GLUCOSE 87 119*  BUN 30* 26*  CREATININE 1.39* 1.40*  CALCIUM 7.8* 8.0*    PT/INR: No results for input(s): LABPROT, INR in the last 72 hours. ABG    Component Value Date/Time   PHART 7.419 03/03/2021 1955   HCO3 22.6 03/03/2021 1955   TCO2 24 03/03/2021 1955   ACIDBASEDEF 2.0 03/03/2021 1955   O2SAT 62.9 03/04/2021 0838   CBG (last 3)  Recent Labs    03/07/21 1557 03/07/21 2156 03/08/21 0651  GLUCAP 102* 118* 103*    Assessment/Plan: S/P Procedure(s) (LRB): TEMPORARY PACEMAKER (N/A) Doing well CV- in SR this AM. In and out of A fib on amiodarone and low dose Lopressor  On Eliquis for A fib  Low dose ASA, statin, beta blocker RESP- on 2 L ,  still has decreased BS at bases RENAL- creatinine  stable  Continue diuresis but change to PO Lasix ENDO- CBG well controlled GI- tolerating diet Continue cardiac rehab    LOS: 7 days    Keith Wagner 03/08/2021

## 2021-03-08 NOTE — Progress Notes (Signed)
Subjective:  Feels well, has been going in and out of A. fib with RVR.    Intake/Output from previous day:  I/O last 3 completed shifts: In: 1290.6 [P.O.:1180; I.V.:110.6] Out: 4225 [Urine:4225] Total I/O In: 240 [P.O.:240] Out: -   Blood pressure 130/78, pulse (!) 126, temperature 98.1 F (36.7 C), temperature source Oral, resp. rate 20, height _0  (1.676 m), weight 81.5 kg, SpO2 95 %. Physical Exam Vitals reviewed.  Constitutional:      Appearance: He is obese.  HENT:     Head: Normocephalic and atraumatic.  Eyes:     Extraocular Movements: Extraocular movements intact.  Neck:     Vascular: No carotid bruit or JVD.  Cardiovascular:     Rate and Rhythm: Normal rate and regular rhythm.     Pulses: Intact distal pulses.          Dorsalis pedis pulses are 1+ on the right side and 1+ on the left side.       Posterior tibial pulses are 1+ on the right side and 1+ on the left side.     Heart sounds: S1 normal and S2 normal. Murmur heard.  Harsh early systolic murmur is present with a grade of 2/6 at the upper right sternal border.    No gallop.  Pulmonary:     Effort: Pulmonary effort is normal. No respiratory distress.     Breath sounds: No wheezing, rhonchi or rales.  Abdominal:     General: Abdomen is flat. Bowel sounds are normal.     Palpations: Abdomen is soft.  Musculoskeletal:     Right lower leg: No edema.     Left lower leg: No edema.  Skin:    General: Skin is warm.     Capillary Refill: Capillary refill takes less than 2 seconds.  Neurological:     Mental Status: He is alert.   Lab Results: BMP BNP (last 3 results) Recent Labs    03/06/21 0303 03/07/21 0334 03/08/21 0119  BNP 700.7* 694.7* 767.4*    ProBNP (last 3 results) Recent Labs    12/22/20 0833 01/01/21 0930 01/30/21 0920  PROBNP 1,673* 1,231* 412    BMP Latest Ref Rng & Units 03/08/2021 03/07/2021 03/06/2021  Glucose 70 - 99 mg/dL 119(H) 87 87  BUN 8 - 23 mg/dL 26(H) 30(H) 31(H)   Creatinine 0.61 - 1.24 mg/dL 1.40(H) 1.39(H) 1.53(H)  BUN/Creat Ratio 10 - 24 - - -  Sodium 135 - 145 mmol/L 136 136 134(L)  Potassium 3.5 - 5.1 mmol/L 4.4 3.8 3.8  Chloride 98 - 111 mmol/L 103 105 104  CO2 22 - 32 mmol/L _1 Calcium 8.9 - 10.3 mg/dL 8.0(L) 7.8(L) 7.4(L)   Hepatic Function Latest Ref Rng & Units 02/27/2021 12/08/2020  Total Protein 6.5 - 8.1 g/dL 6.8 6.5  Albumin 3.5 - 5.0 g/dL 3.7 4.1  AST 15 - 41 U/L 22 24  ALT 0 - 44 U/L 18 18  Alk Phosphatase 38 - 126 U/L 59 69  Total Bilirubin 0.3 - 1.2 mg/dL 0.7 0.3   CBC Latest Ref Rng & Units 03/07/2021 03/06/2021 03/05/2021  WBC 4.0 - 10.5 K/uL 9.5 10.8(H) 12.6(H)  Hemoglobin 13.0 - 17.0 g/dL 8.0(L) 8.0(L) 8.1(L)  Hematocrit 39.0 - 52.0 % 25.2(L) 25.0(L) 25.3(L)  Platelets 150 - 400 K/uL 252 210 179   Lipid Panel  No results found for: CHOL, TRIG, HDL, CHOLHDL, VLDL, LDLCALC, LDLDIRECT Cardiac Panel (last 3 results) No results for input(s): CKTOTAL,  CKMB, TROPONINI, RELINDX in the last 72 hours.  HEMOGLOBIN A1C Lab Results  Component Value Date   HGBA1C 6.3 (H) 02/27/2021   MPG 134.11 02/27/2021   TSH No results for input(s): TSH in the last 8760 hours.  Imaging: Imaging results have been reviewed  Cardiac Studies: Left Heart Catheterization 02/06/2021: LM: Distal calcific 75% stenosis LAD: Mid 80% stenosis LCX: large vessel. Ostial 70% stenosis.  RCA: Dominant, but small vessel. No significant disease seen on well opacified non-selective angiogram.   Normal PCW Unable to cross aortic valve due to severe aorta tortuosity.  1. Left ventricular ejection fraction, by estimation, is 50 to 55%. Left ventricular ejection fraction by PLAX is 45 %. The left ventricle has low normal function. The left ventricle has no regional wall motion abnormalities. There is mild left  ventricular hypertrophy. Left ventricular diastolic parameters are consistent with Grade II diastolic dysfunction (pseudonormalization).  Elevated left ventricular end-diastolic pressure.  2. Right ventricular systolic function is normal. The right ventricular size is normal.  3. Left atrial size was moderately dilated.  4. The mitral valve was not well visualized. Trivial mitral valve regurgitation.  5. The aortic valve was not well visualized. Aortic valve regurgitation is not visualized. Mild aortic valve stenosis. There is a 21 mm Edwards Edwards InspirEase Resilia valve present in the aortic position. Aortic valve area, by VTI measures 0.98 cm.  Aortic valve mean gradient measures 17.5 mmHg. Aortic valve Vmax measures 2.77 m/s.  6. The inferior vena cava is dilated in size with <50% respiratory variability, suggesting right atrial pressure of 15 mmHg.   CABG x2 on 03/01/2021: LIMA to LAD and SVG to OM and aortic valve replacement with a 21 mm Edwards InspirEase Resilia bioprosthetic valve.  Temporary Pacer Placement 12/102022:  Left IJ vein access uner ultrasound guidance.  Tempo balloontipped pacemaker was introduced into the right ventricular apex under direct visualization, the prongs were deployed and balloon deflated and V pacing obtained at MA of 1.0. Pacemaker left at demand VVI pace at 80 bpm with a MA of 5.0.  Patient tolerated procedure.  No immediate complication.  Echocardiogram 03/03/2021:   1. Left ventricular ejection fraction, by estimation, is 50 to 55%. Left  ventricular ejection fraction by PLAX is 45 %. The left ventricle has low  normal function. The left ventricle has no regional wall motion  abnormalities. There is mild left  ventricular hypertrophy. Left ventricular diastolic parameters are  consistent with Grade II diastolic dysfunction (pseudonormalization).  Elevated left ventricular end-diastolic pressure.   2. Right ventricular systolic function is normal. The right ventricular  size is normal.   3. Left atrial size was moderately dilated.   4. The mitral valve was not well visualized.  Trivial mitral valve  regurgitation.   5. The aortic valve was not well visualized. Aortic valve regurgitation  is not visualized. Mild aortic valve stenosis. There is a 21 mm Edwards  Edwards InspirEase Resilia valve present in the aortic position. Aortic  valve area, by VTI measures 0.98 cm.   Aortic valve mean gradient measures 17.5 mmHg. Aortic valve Vmax measures  2.77 m/s.   6. The inferior vena cava is dilated in size with <50% respiratory  variability, suggesting right atrial pressure of 15 mmHg.   EKG:  EKG 03/03/2021: Episodes of sinus arrest with junctional escape, PACs.  Nonspecific T abnormality.   EKG 03/02/2021: Normal sinus rhythm at rate of 76 bpm, normal axis, nonspecific T abnormality.   Scheduled Meds:  [START  ON 03/12/2021] amiodarone  200 mg Oral Daily   amiodarone  400 mg Oral BID   apixaban  5 mg Oral BID   aspirin  81 mg Oral Daily   atorvastatin  40 mg Oral Daily   bisacodyl  10 mg Oral Daily   Or   bisacodyl  10 mg Rectal Daily   chlorhexidine gluconate (MEDLINE KIT)  15 mL Mouth Rinse BID   Chlorhexidine Gluconate Cloth  6 each Topical Daily   docusate sodium  200 mg Oral Daily   furosemide  40 mg Oral BID   insulin aspart  0-15 Units Subcutaneous TID WC   magnesium oxide  400 mg Oral Daily   mouth rinse  15 mL Mouth Rinse QID   metoprolol tartrate  12.5 mg Oral BID   pantoprazole  40 mg Oral Daily   potassium chloride SA  20 mEq Oral Daily   sacubitril-valsartan  1 tablet Oral BID   sodium chloride flush  10-40 mL Intracatheter Q12H   sodium chloride flush  3 mL Intravenous Q12H   Continuous Infusions:  sodium chloride Stopped (03/02/21 0801)   sodium chloride     sodium chloride 20 mL/hr at 03/01/21 1512   albumin human     lactated ringers     lactated ringers 10 mL/hr at 03/01/21 0747   lactated ringers Stopped (03/01/21 1737)   lactated ringers Stopped (03/02/21 2150)   PRN Meds:.sodium chloride, acetaminophen, albuterol, calcium  carbonate, lactated ringers, metoprolol tartrate, ondansetron (ZOFRAN) IV, oxyCODONE, sodium chloride flush, sodium chloride flush, traMADol  Assessment/Plan:  JADON HARBAUGH is a 80 y.o. Caucasian male patient with aortic stenosis, hypertension, type 2 diabetes mellitus, hyperlipidemia who underwent coronary angiography on 02/06/2021 for evaluation of dyspnea and aortic stenosis, found to have calcific distal left main 45% stenosis and mid LAD 80% and ostial circumflex 70% stenosis for which he underwent CABG x2 with LIMA to LAD and SVG to OM and aortic valve replacement with a 21 mm Edwards InspirEase Resilia bioprosthetic valve on 03/01/2021. He subsequently developed episodes of heart block and worsening renal function.   1.  Acute diastolic heart failure 2.  Sinus node dysfunction and paroxysmal atrial fibrillation, postop CHA2DS2-VASc Score is 5.  Yearly risk of stroke: 7.2% (A, CAD, CHF, HTN).  Score of 1=0.6; 2=2.2; 3=3.2; 4=4.8; 5=7.2; 6=9.8; 7=>9.8) -(CHF; HTN; vasc disease DM,  Male = 1; Age <65 =0; 65-74 = 1,  >75 =2; stroke/embolism= 2).   3. Nonischemic cardiomyopathy, left main disease SP CABG and aortic valve replacement for moderate aortic stenosis, symptomatic on 03/01/2021.  EF prior to CABG was 35%, recent echo revealing at least a low normal LVEF. 4.  Chronic renal failure stage IIIa, stable.    Recommendation:  CHF improved, still has significant scrotal edema. Continue Lasix 40 mg BID for now and can be discharged on once a day dose. Tolerating Entresto low dose. Will not titrate up due to renal failure, Tolerating Metoprolol, now in sinus and Eliquis started. Will increase to 25 mg BID for now.  Ambulate. Discharge per TCTS. Please call if questions.    Adrian Prows, MD, [facility] 03/08/2021, 8:27 AM Office: [phone] Fax: [phone] Pager: [phone]

## 2021-03-09 ENCOUNTER — Inpatient Hospital Stay (HOSPITAL_COMMUNITY): Payer: Medicare HMO

## 2021-03-09 DIAGNOSIS — L8990 Pressure ulcer of unspecified site, unspecified stage: Secondary | ICD-10-CM | POA: Insufficient documentation

## 2021-03-09 DIAGNOSIS — Z952 Presence of prosthetic heart valve: Secondary | ICD-10-CM

## 2021-03-09 LAB — ECHO INTRAOPERATIVE TEE
AV Mean grad: 15 mmHg
Ao-asc: 3.5 cm
Height: 66 in
STJ: 2.5 cm
Sinus: 3 cm
Weight: 2640 oz

## 2021-03-09 LAB — BASIC METABOLIC PANEL
Anion gap: 6 (ref 5–15)
BUN: 27 mg/dL — ABNORMAL HIGH (ref 8–23)
CO2: 27 mmol/L (ref 22–32)
Calcium: 8.4 mg/dL — ABNORMAL LOW (ref 8.9–10.3)
Chloride: 103 mmol/L (ref 98–111)
Creatinine, Ser: 1.4 mg/dL — ABNORMAL HIGH (ref 0.61–1.24)
GFR, Estimated: 51 mL/min — ABNORMAL LOW (ref >60)
Glucose, Bld: 117 mg/dL — ABNORMAL HIGH (ref 70–99)
Potassium: 4.8 mmol/L (ref 3.5–5.1)
Sodium: 136 mmol/L (ref 135–145)

## 2021-03-09 LAB — CBC
HCT: 28 % — ABNORMAL LOW (ref 39.0–52.0)
Hemoglobin: 8.7 g/dL — ABNORMAL LOW (ref 13.0–17.0)
MCH: 25.1 pg — ABNORMAL LOW (ref 26.0–34.0)
MCHC: 31.1 g/dL (ref 30.0–36.0)
MCV: 80.7 fL (ref 80.0–100.0)
Platelets: 359 10*3/uL (ref 150–400)
RBC: 3.47 MIL/uL — ABNORMAL LOW (ref 4.22–5.81)
RDW: 18.9 % — ABNORMAL HIGH (ref 11.5–15.5)
WBC: 10.2 10*3/uL (ref 4.0–10.5)
nRBC: 0.2 % (ref 0.0–0.2)

## 2021-03-09 LAB — GLUCOSE, CAPILLARY
Glucose-Capillary: 112 mg/dL — ABNORMAL HIGH (ref 70–99)
Glucose-Capillary: 102 mg/dL — ABNORMAL HIGH (ref 70–99)
Glucose-Capillary: 134 mg/dL — ABNORMAL HIGH (ref 70–99)
Glucose-Capillary: 105 mg/dL — ABNORMAL HIGH (ref 70–99)

## 2021-03-09 LAB — MAGNESIUM: Magnesium: 2.1 mg/dL (ref 1.7–2.4)

## 2021-03-09 NOTE — TOC Initial Note (Signed)
Transition of Care (TOC) - Initial/Assessment Note  Marvetta Gibbons RN, BSN Transitions of Care Unit 4E- RN Case Manager See Treatment Team for direct phone #    Patient Details  Name: Keith Wagner MRN: [id number] Date of Birth: [birth year]  Transition of Care [facility]) CM/SW Contact:    Dawayne Patricia, RN Phone Number: 03/09/2021, 2:55 PM  Clinical Narrative:                 Pt s/p CABG from home w/ wife. Orders placed for HHRN/PT. CM in to speak with pt and wife at bedside. Sister-n-law also present.  Discussed HH- pt agreeable to [facility] services- list provided for choice Per CMS guidelines from medicare.gov website with star ratings (copy placed in shadow chart) - wife has selected [facility] as first choice with [place] as back up.  Pt states he has a RW at home, has been handed down. Family would like to check it and make sure it is still in good shape- if they need to get RW they will let staff know in am. Otherwise requesting 3n1 for home.  Wife to transport home, address, phone # and PCP all confirmed in epic.   Noted pt started on Entrestro and Eliquis. Provided 30 day coupons for both to use at outside pharmacy on discharge. Per benefits check pt is in coverage gap until the first of the year.   Call made to [facility] for DME referral- 3n1 to be delivered to room prior to discharge ( family to let us know if RW needed)  Call made to [facility] with [facility] for HHRN/PT referral- referral has been accepted and they will follow for start of care post discharge.   Expected Discharge Plan: [place] Barriers to Discharge: Continued Medical Work up   Patient Goals and CMS Choice Patient states their goals for this hospitalization and ongoing recovery are:: return home CMS Medicare.gov Compare Post Acute Care list provided to:: Patient Choice offered to / list presented to : Patient, Spouse  Expected Discharge Plan and Services Expected Discharge Plan: [place]   Discharge Planning Services: CM Consult Post Acute Care Choice: Durable Medical Equipment, Home Health Living arrangements for the past 2 months: Single Family Home                 DME Arranged: 3-N-1 DME Agency: [facility] Date DME Agency Contacted: 03/09/21 Time DME Agency Contacted: [phone] Representative spoke with at DME Agency: Freda Munro HH Arranged: PT, RN, Disease Management [place] Agency: [place] Date [place]: 03/09/21 Time [place]: 1454 Representative spoke with at [place]: [place] Arrangements/Services Living arrangements for the past 2 months: [place] Lives with:: Spouse Patient language and need for interpreter reviewed:: Yes Do you feel safe going back to the place where you live?: Yes      Need for Family Participation in Patient Care: Yes (Comment) Care giver support system in place?: Yes (comment) Current home services: DME (RW) Criminal Activity/Legal Involvement Pertinent to Current Situation/Hospitalization: No - Comment as needed  Activities of Daily Living      Permission Sought/Granted Permission sought to share information with : Facility Art therapist granted to share information with : Yes, Verbal Permission Granted     Permission granted to share info w AGENCY: DME/HH        Emotional Assessment Appearance:: Appears stated age Attitude/Demeanor/Rapport: Engaged Affect (typically observed): Accepting, Appropriate, Pleasant  Orientation: : Oriented to Place, Oriented to  Time, Oriented to Self, Oriented to Situation Alcohol / Substance Use: Not Applicable Psych Involvement: No (comment)  Admission diagnosis:  S/P CABG x 2 [Z95.1] Patient Active Problem List   Diagnosis Date Noted   Pressure injury of skin 03/09/2021   S/P AVR 30/13/1438   Acute diastolic heart failure (HCC)    PAF (paroxysmal atrial fibrillation) (HCC)    Chronic renal failure, stage 3b  (HCC)    Sinus node dysfunction (HCC)    S/P CABG x 2 03/01/2021   HFrEF (heart failure with reduced ejection fraction) ([place]) 02/06/2021   Bilateral carotid bruits 06/12/2018   Mild aortic stenosis 06/12/2018   Laboratory examination 06/12/2018   Hypercholesteremia 06/12/2018   Benign essential hypertension 11/07/2014   PCP:  Aletha Halim PA-C Pharmacy:   [facility] [address] [place] [address] [place] [address] Phone: [phone] Fax: [phone]  [facility] [place], [place] [address] Phone: [phone] Fax: [phone]     Social Determinants of Health (SDOH) Interventions    Readmission Risk Interventions No flowsheet data found.

## 2021-03-09 NOTE — Progress Notes (Signed)
CARDIAC REHAB PHASE I   Offered to walk with pt. Pt states ambulation with PT this morning. Denies DME needs. Pt family coming later today. Will return to ambulate and educate as time allows.  [phone] Rufina Falco, RN BSN 03/09/2021 11:28 AM

## 2021-03-09 NOTE — Progress Notes (Signed)
Mobility Specialist: Progress Note   03/09/21 1734  Mobility  Activity Ambulated in hall  Level of Assistance Standby assist, set-up cues, supervision of patient - no hands on  Assistive Device Front wheel walker  Distance Ambulated (ft) 240 ft  Mobility Ambulated with assistance in hallway  Mobility Response Tolerated well  Mobility performed by Mobility specialist  $Mobility charge 1 Mobility   Pre-Mobility: 66 HR Post-Mobility: 80 HR  Pt had no c/o throughout ambulation. Pt back to bed after walk per request with call bell and phone at his side.   [facility] Keith Wagner Mobility Specialist Mobility Specialist 4 [place]: [phone] Mobility Specialist 2 [place] and [place]: [phone]

## 2021-03-09 NOTE — Care Management Important Message (Signed)
Important Message  Patient Details  Name: Keith Wagner MRN: [id number] Date of Birth: 06-30-1940   Medicare Important Message Given:  Yes     Shelda Altes 03/09/2021, 9:55 AM

## 2021-03-09 NOTE — Evaluation (Signed)
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 Pt had to have pacer placed on 12/10. PMH includes a fib and CAD.  Clinical Impression  Pt admitted secondary to problem above with deficits below. Pt requiring min guard A for mobility tasks and required cues to maintain precautions. Pt tolerated well; HR elevated to 108 and oxygen sats at 97-98% on RA. Pt reports wife and other family members can assist him as needed. Will continue to follow acutely.        Recommendations for follow up therapy are one component of a multi-disciplinary discharge planning process, led by the attending physician.  Recommendations may be updated based on patient status, additional functional criteria and insurance authorization.  Follow Up Recommendations Home health PT    Assistance Recommended at Discharge Intermittent Supervision/Assistance  Functional Status Assessment Patient has had a recent decline in their functional status and demonstrates the ability to make significant improvements in function in a reasonable and predictable amount of time.  Equipment Recommendations  None recommended by PT    Recommendations for Other Services       Precautions / Restrictions Precautions Precautions: Sternal Precaution Booklet Issued: No Precaution Comments: Verbally reviewed sternal precautions. Restrictions Weight Bearing Restrictions:  (sternal precautions)      Mobility  Bed Mobility               General bed mobility comments: In chair upon entry    Transfers Overall transfer level: Needs assistance Equipment used: Rolling walker (2 wheels) Transfers: Sit to/from Stand Sit to Stand: Min guard           General transfer comment: Min guard for safety. Cues for hand placement to maintain sternal precautions.     Ambulation/Gait Ambulation/Gait assistance: Min guard Gait Distance (Feet): 200 Feet Assistive device: Rolling walker (2 wheels) Gait Pattern/deviations: Step-through pattern;Decreased stride length Gait velocity: Decreased decreased     General Gait Details: Min guard for safety. HR elevated to 108 during ambulation.  Stairs            Wheelchair Mobility    Modified Rankin (Stroke Patients Only)       Balance Overall balance assessment: Needs assistance Sitting-balance support: No upper extremity supported Sitting balance-Leahy Scale: Good     Standing balance support: Bilateral upper extremity supported Standing balance-Leahy Scale: Poor Standing balance comment: Reliant on BUE support                             Pertinent Vitals/Pain Pain Assessment: No/denies pain    Home Living Family/patient expects to be discharged to:: Private residence Living Arrangements: Spouse/significant other Available Help at Discharge: Family;Available 24 hours/day Type of Home: House Home Access: Stairs to enter Entrance Stairs-Rails: None Entrance Stairs-Number of Steps: 3   Home Layout: One level Home Equipment: Conservation officer, nature (2 wheels);BSC/3in1      Prior Function Prior Level of Function : Independent/Modified Independent;Driving                     Hand Dominance        Extremity/Trunk Assessment   Upper Extremity Assessment Upper Extremity Assessment: Generalized weakness    Lower Extremity Assessment Lower Extremity Assessment: Generalized weakness    Cervical / Trunk Assessment Cervical / Trunk Assessment: Other exceptions Cervical /  Trunk Exceptions: sternal incision  Communication   Communication: No difficulties  Cognition Arousal/Alertness: Awake/alert Behavior During Therapy: WFL for tasks assessed/performed Overall Cognitive Status: Within Functional Limits for tasks assessed                                           General Comments      Exercises     Assessment/Plan    PT Assessment Patient needs continued PT services  PT Problem List Decreased strength;Decreased activity tolerance;Decreased balance;Decreased mobility;Cardiopulmonary status limiting activity       PT Treatment Interventions Gait training;Stair training;Functional mobility training;Therapeutic activities;Therapeutic exercise;Cognitive remediation    PT Goals (Current goals can be found in the Care Plan section)  Acute Rehab PT Goals Patient Stated Goal: to go home PT Goal Formulation: With patient Time For Goal Achievement: 03/23/21 Potential to Achieve Goals: Good    Frequency Min 3X/week   Barriers to discharge        Co-evaluation               AM-PAC PT "6 Clicks" Mobility  Outcome Measure Help needed turning from your back to your side while in a flat bed without using bedrails?: None Help needed moving from lying on your back to sitting on the side of a flat bed without using bedrails?: A Little Help needed moving to and from a bed to a chair (including a wheelchair)?: A Little Help needed standing up from a chair using your arms (e.g., wheelchair or bedside chair)?: A Little Help needed to walk in hospital room?: A Little Help needed climbing 3-5 steps with a railing? : A Little 6 Click Score: 19    End of Session Equipment Utilized During Treatment: Gait belt Activity Tolerance: Patient tolerated treatment well Patient left: in chair;with call bell/phone within reach Nurse Communication: Mobility status PT Visit Diagnosis: Other abnormalities of gait and mobility (R26.89);Muscle weakness (generalized) (M62.81)    Time: 0539-7673 PT Time Calculation (min) (ACUTE ONLY): 17 min   Charges:   PT Evaluation $PT Eval Moderate Complexity: 1 Mod          Reuel Derby, PT, DPT  Acute Rehabilitation Services  Pager: [phone] Office: [phone]   Rudean Hitt 03/09/2021, 9:36 AM

## 2021-03-09 NOTE — Progress Notes (Signed)
CARDIAC REHAB PHASE I   PRE:  Rate/Rhythm: 60 SR  BP:  Sitting: 82 SR      SaO2: 93 RA  MODE:  Ambulation: 240 ft   POST:  Rate/Rhythm: 82 SR  BP:  Sitting: 146/76    SaO2: 95 RA   Pt ambulated 279ft in hallway assist of one with front wheel walker. Pt denied CP, SOB, or dizziness throughout session. Pt returned to recliner. D/c education completed with pt and family. Pt educated on importance of site care and monitoring incisions daily. Encouraged continued IS use, walks, and sternal precautions. Pt given in-the-tube sheet along with heart healthy and diabetic diets. Reviewed restrictions and exercise guidelines. Pt picked [facility] agency and requesting 3-in-1, CM aware. Will refer to CRP II [place]. Encouraged continued ambulation and IS use. Will continue to follow.  [id number] Rufina Falco, RN BSN 03/09/2021 2:51 PM

## 2021-03-09 NOTE — Progress Notes (Addendum)
° °   °  [address]       [place] [address]             [phone]       6 Days Post-Op Procedure(s) (LRB): TEMPORARY PACEMAKER (N/A)  Subjective:  Patient w/o specific complaints.  States he got up for his CXR and how he is just using the bathroom.  Objective: Vital signs in last 24 hours: Temp:  [97.5 F (36.4 C)-98.4 F (36.9 C)] 98.3 F (36.8 C) (12/16 0334) Pulse Rate:  [60-71] 70 (12/16 0334) Cardiac Rhythm: Normal sinus rhythm (12/15 1900) Resp:  [11-21] 17 (12/16 0334) BP: (105-162)/(72-86) 105/72 (12/16 0334) SpO2:  [91 %-98 %] 92 % (12/16 0334) Weight:  [81 kg-83.6 kg] 81 kg (12/16 0334)  Intake/Output from previous day: 12/15 0701 - 12/16 0700 In: 480 [P.O.:480] Out: 1500 [Urine:1500]  General appearance: alert, cooperative, and no distress Heart: regular rate and rhythm Lungs: clear to auscultation bilaterally Abdomen: soft, non-tender; bowel sounds normal; no masses,  no organomegaly Extremities: edema 1+ Wound: clean and dry  Lab Results: Recent Labs    03/07/21 0334 03/09/21 0258  WBC 9.5 10.2  HGB 8.0* 8.7*  HCT 25.2* 28.0*  PLT 252 359   BMET:  Recent Labs    03/08/21 0119 03/09/21 0258  NA 136 136  K 4.4 4.8  CL 103 103  CO2 27 27  GLUCOSE 119* 117*  BUN 26* 27*  CREATININE 1.40* 1.40*  CALCIUM 8.0* 8.4*    PT/INR: No results for input(s): LABPROT, INR in the last 72 hours. ABG    Component Value Date/Time   PHART 7.419 03/03/2021 1955   HCO3 22.6 03/03/2021 1955   TCO2 24 03/03/2021 1955   ACIDBASEDEF 2.0 03/03/2021 1955   O2SAT 62.9 03/04/2021 0838   CBG (last 3)  Recent Labs    03/08/21 1550 03/08/21 2134 03/09/21 0559  GLUCAP 175* 148* 112*    Assessment/Plan: S/P Procedure(s) (LRB): TEMPORARY PACEMAKER (N/A)  CV- PAF, currently in NSR- on Amiodarone, Lopressor, Entresto, Aldactone, Farxiga, and Eliquis Pulm- no acute issues, continue IS Renal- creatinine stable, looks volume overloaded  on exam, weight is trending down, continue Lasix, potassium CBGs controlled Deconditioning- will get PT consult to assess for home needs Dispo- patient in NSR, remains volume overloaded, continue diuretics, will arrange home health if recommended per PT notes.. patient making progress possibly ready for d/c in the next 24-48 hours depending volume status   LOS: 8 days   Ellwood Handler, PA-C 03/09/2021  Patient seen and examined, agree with above Still has bilateral pleural effusions and is generally edematous, but continues to diurese and progress. Hopefully home over the weekend  Revonda Standard. Roxan Hockey, MD [facility] [phone]

## 2021-03-10 LAB — BASIC METABOLIC PANEL
Anion gap: 8 (ref 5–15)
BUN: 29 mg/dL — ABNORMAL HIGH (ref 8–23)
CO2: 25 mmol/L (ref 22–32)
Calcium: 8.2 mg/dL — ABNORMAL LOW (ref 8.9–10.3)
Chloride: 102 mmol/L (ref 98–111)
Creatinine, Ser: 1.52 mg/dL — ABNORMAL HIGH (ref 0.61–1.24)
GFR, Estimated: 46 mL/min — ABNORMAL LOW (ref >60)
Glucose, Bld: 109 mg/dL — ABNORMAL HIGH (ref 70–99)
Potassium: 3.9 mmol/L (ref 3.5–5.1)
Sodium: 135 mmol/L (ref 135–145)

## 2021-03-10 LAB — GLUCOSE, CAPILLARY
Glucose-Capillary: 112 mg/dL — ABNORMAL HIGH (ref 70–99)
Glucose-Capillary: 121 mg/dL — ABNORMAL HIGH (ref 70–99)
Glucose-Capillary: 105 mg/dL — ABNORMAL HIGH (ref 70–99)

## 2021-03-10 LAB — MAGNESIUM: Magnesium: 2.1 mg/dL (ref 1.7–2.4)

## 2021-03-10 MED ORDER — AMIODARONE IV BOLUS ONLY 150 MG/100ML
150.0000 mg | Freq: Once | INTRAVENOUS | Status: AC
  Filled 2021-03-10: qty 100

## 2021-03-10 MED ORDER — AMIODARONE HCL IN DEXTROSE 360-4.14 MG/200ML-% IV SOLN
INTRAVENOUS | Status: AC
  Administered 2021-03-10: 150 mg/h via OTIC
  Filled 2021-03-10: qty 200

## 2021-03-10 MED ORDER — FUROSEMIDE 40 MG PO TABS
40.0000 mg | ORAL_TABLET | Freq: Every day | ORAL | Status: DC
  Administered 2021-03-10 – 2021-03-12 (×3): 40 mg via ORAL
  Filled 2021-03-10 (×3): qty 1

## 2021-03-10 NOTE — Progress Notes (Signed)
CARDIAC REHAB PHASE I   Pts HR still Afibs 130-140s, will hold ambulation as this time. Pts wife requesting walker for home use. Will continue to follow  Rufina Falco, RN BSN 03/10/2021 12:00 PM

## 2021-03-10 NOTE — Progress Notes (Addendum)
° °   °  [address]       [place] [address]             [phone]      7 Days Post-Op Procedure(s) (LRB): TEMPORARY PACEMAKER (N/A)  Subjective:  Went back into rapid A. Fib this morning.  Resting in bed, denies chest pain, shortness of breath  Objective: Vital signs in last 24 hours: Temp:  [97.6 F (36.4 C)-98.7 F (37.1 C)] 98.2 F (36.8 C) (12/17 0738) Pulse Rate:  [60-138] 138 (12/17 0738) Cardiac Rhythm: Atrial fibrillation (12/17 0725) Resp:  [18-20] 19 (12/17 0738) BP: (117-151)/(73-89) 117/80 (12/17 0738) SpO2:  [94 %-97 %] 95 % (12/17 0738) Weight:  [79.6 kg] 79.6 kg (12/17 0320)  Intake/Output from previous day: 12/16 0701 - 12/17 0700 In: 500 [P.O.:500] Out: 2550 [Urine:2550]  General appearance: alert, cooperative, and no distress Heart: irregularly irregular rhythm Lungs: clear to auscultation bilaterally Abdomen: soft, non-tender; bowel sounds normal; no masses,  no organomegaly Extremities: edema trace Wound: clean and dry  Lab Results: Recent Labs    03/09/21 0258  WBC 10.2  HGB 8.7*  HCT 28.0*  PLT 359   BMET:  Recent Labs    03/09/21 0258 03/10/21 0113  NA 136 135  K 4.8 3.9  CL 103 102  CO2 27 25  GLUCOSE 117* 109*  BUN 27* 29*  CREATININE 1.40* 1.52*  CALCIUM 8.4* 8.2*    PT/INR: No results for input(s): LABPROT, INR in the last 72 hours. ABG    Component Value Date/Time   PHART 7.419 03/03/2021 1955   HCO3 22.6 03/03/2021 1955   TCO2 24 03/03/2021 1955   ACIDBASEDEF 2.0 03/03/2021 1955   O2SAT 62.9 03/04/2021 0838   CBG (last 3)  Recent Labs    03/09/21 1514 03/09/21 2114 03/10/21 0606  GLUCAP 134* 105* 112*    Assessment/Plan: S/P Procedure(s) (LRB): TEMPORARY PACEMAKER (N/A)  CV- A. Fib with RVR this morning, AM Lopressor, Amiodarone administered early w/o much response.. will give IV Amiodarone bolus, continue Entreso, Eliquis, Farxiga Pulm- no acute issues, continue IS Renal-  creatinine bumped to 1.52, likely AKI due to diuretics.. Dr. Einar Gip has stopped patients Aldactone and reduced Lasix to 40 mg daily, repeat BMET in AM Deconditioning- home health orders have been placed Dispo- patient back in A. Fib with RVR this morning, rebolus Amiodarone.. developed elevated creatinine up to 1.52, likely due to diuretics.. regimen adjusted per Dr. Einar Gip, repeat labs in AM, continue current care   LOS: 9 days    Ellwood Handler, PA-C 03/10/2021   Chart reviewed, patient examined, agree with above. If creatinine any higher tomorrow may need to hold Entresto. Atrial fib 120's this am. Amio bolus ordered.

## 2021-03-10 NOTE — Progress Notes (Signed)
Patient converted to normal sinus in 60's at 1254. Held morning dose of lasix for low blood pressure and awaiting call back from [place] after paging.

## 2021-03-10 NOTE — Progress Notes (Signed)
Patient went into afib w/rvr at shift change. HR elevated to 140's at time. Gave daily dose of amiodarone, metoprolol, eliquis, aspirin and entresto. Patient vital signs stable otherwise. Patient stated his "coffee was too strong". Patient denies palpations, dizziness or lightheadedness. PA Erin made aware.  Currently 109-130's. Pt resting with call bell within reach.  Will continue to monitor.

## 2021-03-10 NOTE — Progress Notes (Signed)
Patient now seen.  S. Cr trending up. Will decrease furosemide to 40 mg PO q daily from BID and discontinue Spironolactone.   Adrian Prows, MD, [facility] 03/10/2021, 7:44 AM Office: [phone] Fax: [phone] Pager: [phone]

## 2021-03-11 DIAGNOSIS — N179 Acute kidney failure, unspecified: Secondary | ICD-10-CM

## 2021-03-11 DIAGNOSIS — Z7901 Long term (current) use of anticoagulants: Secondary | ICD-10-CM

## 2021-03-11 DIAGNOSIS — I2510 Atherosclerotic heart disease of native coronary artery without angina pectoris: Secondary | ICD-10-CM

## 2021-03-11 DIAGNOSIS — D62 Acute posthemorrhagic anemia: Secondary | ICD-10-CM

## 2021-03-11 DIAGNOSIS — Z79899 Other long term (current) drug therapy: Secondary | ICD-10-CM

## 2021-03-11 LAB — HEPATIC FUNCTION PANEL
ALT: 27 U/L (ref 0–44)
AST: 20 U/L (ref 15–41)
Albumin: 3.1 g/dL — ABNORMAL LOW (ref 3.5–5.0)
Alkaline Phosphatase: 58 U/L (ref 38–126)
Bilirubin, Direct: 0.2 mg/dL (ref 0.0–0.2)
Indirect Bilirubin: 0.7 mg/dL (ref 0.3–0.9)
Total Bilirubin: 0.9 mg/dL (ref 0.3–1.2)
Total Protein: 6.2 g/dL — ABNORMAL LOW (ref 6.5–8.1)

## 2021-03-11 LAB — TSH: TSH: 8.253 u[IU]/mL — ABNORMAL HIGH (ref 0.350–4.500)

## 2021-03-11 LAB — BASIC METABOLIC PANEL
Anion gap: 8 (ref 5–15)
BUN: 27 mg/dL — ABNORMAL HIGH (ref 8–23)
CO2: 25 mmol/L (ref 22–32)
Calcium: 8.4 mg/dL — ABNORMAL LOW (ref 8.9–10.3)
Chloride: 104 mmol/L (ref 98–111)
Creatinine, Ser: 1.41 mg/dL — ABNORMAL HIGH (ref 0.61–1.24)
GFR, Estimated: 51 mL/min — ABNORMAL LOW (ref >60)
Glucose, Bld: 99 mg/dL (ref 70–99)
Potassium: 4.1 mmol/L (ref 3.5–5.1)
Sodium: 137 mmol/L (ref 135–145)

## 2021-03-11 LAB — PREALBUMIN: Prealbumin: 18.8 mg/dL (ref 18–38)

## 2021-03-11 LAB — GLUCOSE, CAPILLARY
Glucose-Capillary: 105 mg/dL — ABNORMAL HIGH (ref 70–99)
Glucose-Capillary: 108 mg/dL — ABNORMAL HIGH (ref 70–99)
Glucose-Capillary: 128 mg/dL — ABNORMAL HIGH (ref 70–99)

## 2021-03-11 LAB — MAGNESIUM: Magnesium: 2.1 mg/dL (ref 1.7–2.4)

## 2021-03-11 MED ORDER — AMIODARONE HCL 200 MG PO TABS
200.0000 mg | ORAL_TABLET | Freq: Every day | ORAL | Status: DC
  Administered 2021-03-11: 10:00:00 200 mg via ORAL
  Filled 2021-03-11: qty 1

## 2021-03-11 MED ORDER — LOSARTAN POTASSIUM 25 MG PO TABS
25.0000 mg | ORAL_TABLET | Freq: Every day | ORAL | Status: DC
  Administered 2021-03-12: 09:00:00 25 mg via ORAL
  Filled 2021-03-11: qty 1

## 2021-03-11 MED ORDER — AMIODARONE HCL 200 MG PO TABS
200.0000 mg | ORAL_TABLET | Freq: Two times a day (BID) | ORAL | Status: DC
  Administered 2021-03-11: 22:00:00 200 mg via ORAL
  Filled 2021-03-11 (×2): qty 1

## 2021-03-11 MED ORDER — POTASSIUM CHLORIDE CRYS ER 20 MEQ PO TBCR
20.0000 meq | EXTENDED_RELEASE_TABLET | Freq: Every day | ORAL | Status: DC
  Administered 2021-03-11 – 2021-03-12 (×2): 20 meq via ORAL
  Filled 2021-03-11 (×2): qty 1

## 2021-03-11 MED ORDER — ENSURE ENLIVE PO LIQD
237.0000 mL | Freq: Two times a day (BID) | ORAL | Status: DC
  Administered 2021-03-11: 13:00:00 237 mL via ORAL

## 2021-03-11 MED ORDER — AMIODARONE HCL 200 MG PO TABS
200.0000 mg | ORAL_TABLET | Freq: Every day | ORAL | Status: DC
  Administered 2021-03-12: 09:00:00 200 mg via ORAL

## 2021-03-11 NOTE — Progress Notes (Signed)
Progress Note  Patient Name: Keith Wagner Date of Encounter: 03/11/2021  Attending physician: [name], MD Primary care provider: Aletha Halim., PA-C Primary Cardiologist: Rex Kras, DO, [facility]  Subjective: Keith Wagner is a 80 y.o. male who was seen and examined at bedside  Denies chest pain, orthopnea, paroxysmal nocturnal dyspnea lower extremity swelling Feels better. Ambulated on the floors. Converted back to normal sinus rhythm yesterday around noon. Case discussed and reviewed with his nurse & cardiothoracic surgery PA.  Objective: Vital Signs in the last 24 hours: Temp:  [97.6 F (36.4 C)-98.5 F (36.9 C)] 98.2 F (36.8 C) (12/18 0747) Pulse Rate:  [60-68] 64 (12/18 0747) Resp:  [13-20] 16 (12/18 0747) BP: (114-150)/(65-98) 150/98 (12/18 0747) SpO2:  [91 %-95 %] 91 % (12/18 0747) Weight:  [81.8 kg] 81.8 kg (12/18 0354)  Intake/Output:  Intake/Output Summary (Last 24 hours) at 03/11/2021 1133 Last data filed at 03/11/2021 0748 Gross per 24 hour  Intake 240 ml  Output 1300 ml  Net -1060 ml    Net IO Since Admission: -8,806.33 mL [03/11/21 1133]  Weights:  Filed Weights   03/09/21 0334 03/10/21 0320 03/11/21 0354  Weight: 81 kg 79.6 kg 81.8 kg    Telemetry: Personally reviewed.  Sinus rhythm with ectopy.  Physical examination: PHYSICAL EXAM: Vitals with BMI 03/11/2021 03/11/2021 03/10/2021  Height - - -  Weight - 180 lbs 5 oz -  BMI - 18.29 -  Systolic 937 169 678  Diastolic 98 80 70  Pulse 64 60 60    CONSTITUTIONAL: Appearing older than stated age, hemodynamically stable, no acute distress.  SKIN: Skin is warm and dry. No rash noted. No cyanosis. No pallor. No jaundice HEAD: Normocephalic and atraumatic.  EYES: No scleral icterus MOUTH/THROAT: Moist oral membranes.  NECK: No JVD present. No thyromegaly noted. No carotid bruits  LYMPHATIC: No visible cervical adenopathy.  CHEST Normal respiratory effort. No intercostal  retractions.  Sternotomy site is healing well without erythema, drainage.  Granulation tissue present. LUNGS: Good inspiratory effort, clear to auscultation in the upper lung fields, decreased breath sounds at the bases.  No wheezing, rhonchi's or rales.   CARDIOVASCULAR: Bradycardic, regular positive S1-S2, no murmurs rubs or gallops appreciated.  ABDOMINAL: Soft, nontender, nondistended, positive bowel sounds in all 4 quadrants, no apparent ascites.  EXTREMITIES: No pitting edema, warm to touch, 2+ DP and PT pulses HEMATOLOGIC: No significant bruising NEUROLOGIC: Oriented to person, place, and time. Nonfocal. Normal muscle tone.  PSYCHIATRIC: Normal mood and affect. Normal behavior. Cooperative  Lab Results: Hematology Recent Labs  Lab 03/06/21 0303 03/07/21 0334 03/09/21 0258  WBC 10.8* 9.5 10.2  RBC 3.16* 3.15* 3.47*  HGB 8.0* 8.0* 8.7*  HCT 25.0* 25.2* 28.0*  MCV 79.1* 80.0 80.7  MCH 25.3* 25.4* 25.1*  MCHC 32.0 31.7 31.1  RDW 18.1* 18.4* 18.9*  PLT 210 252 359    Chemistry Recent Labs  Lab 03/09/21 0258 03/10/21 0113 03/11/21 0059  NA 136 135 137  K 4.8 3.9 4.1  CL 103 102 104  CO2 $Re'27 25 25  'sSS$ GLUCOSE 117* 109* 99  BUN 27* 29* 27*  CREATININE 1.40* 1.52* 1.41*  CALCIUM 8.4* 8.2* 8.4*  GFRNONAA 51* 46* 51*  ANIONGAP $RemoveB'6 8 8     '[id number]$ Cardiac Enzymes: Cardiac Panel (last 3 results) No results for input(s): CKTOTAL, CKMB, TROPONINIHS, RELINDX in the last 72 hours.  BNP (last 3 results) Recent Labs    03/06/21 0303 03/07/21 0334 03/08/21 0119  BNP 700.7* 694.7* 767.4*    ProBNP (last 3 results) Recent Labs    12/22/20 0833 01/01/21 0930 01/30/21 0920  PROBNP 1,673* 1,231* 412     DDimer No results for input(s): DDIMER in the last 168 hours.   Hemoglobin A1c:  Lab Results  Component Value Date   HGBA1C 6.3 (H) 02/27/2021   MPG 134.11 02/27/2021    TSH No results for input(s): TSH in the last 8760 hours.  Lipid Panel No results found for: CHOL,  TRIG, HDL, CHOLHDL, VLDL, LDLCALC, LDLDIRECT  Imaging: No results found.  CARDIAC DATABASE: CABG x2 on 03/01/2021: LIMA to LAD and SVG to OM and aortic valve replacement with a 21 mm Edwards InspirEase Resilia bioprosthetic valve.   Temporary Pacer Placement 03/03/2021:  Left IJ vein access uner ultrasound guidance.  Tempo balloontipped pacemaker was introduced into the right ventricular apex under direct visualization, the prongs were deployed and balloon deflated and V pacing obtained at MA of 1.0. Pacemaker left at demand VVI pace at 80 bpm with a MA of 5.0.  Patient tolerated procedure.  No immediate complication.  EKG: 12/01/2020: Normal sinus rhythm, 94 bpm, left axis deviation, left anterior fascicular block, LVH, nonspecific T wave abnormality, PVCs, without underlying injury pattern.   EKG 03/03/2021: Episodes of sinus arrest with junctional escape, PACs.  Nonspecific T abnormality.   Echocardiogram: 05/25/2019: LVEF 50-55% grade 1 diastolic impairment.  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  03/03/2021: LVEF 50-55%, low normal LVEF, no regional wall motion abnormalities, mild LVH, grade 2 diastolic dysfunction, elevated LVEDP, moderately dilated left atrium, trivial TR, dilated IVC, estimated RAP 15 mmHg.The aortic valve was not well visualized. Aortic valve regurgitation  is not visualized. Mild aortic valve stenosis. There is a 21 mm Edwards  Edwards InspirEase Resilia valve present in the aortic position. Aortic  valve area, by VTI measures 0.98 cm.   Aortic valve mean gradient measures 17.5 mmHg. Aortic valve Vmax measures 2.77 m/s.    Stress Testing: Treadmill Exercise stress 11/11/2014: Indication: Diabetes, HTN The patient exercised according to Bruce Protocol, Total time recorded  6:00 min achieving max  heart rate of  148 which was  100% of MPHR for age and  7.05 METS of work. Occasional PVC during exercise.  Normal BP response. Resting ECG showing NSR. There was no ST-T changes of ischemia with exercise stress test. No exercise induced arrythmias noted. Stress terminated due to [facility] met.   Heart Catheterization: LHC / RHC / Thoracic Aortogram 02/06/2021: LM: Distal calcific 75% stenosis LAD: Mid 80% stenosis LCX: large vessel. Ostial 70% stenosis.  RCA: Dominant, but small vessel. No significant disease seen on well opacified non-selective angiogram.   Normal PCW Unable to cross aortic valve due to severe aorta tortuosity.   CVTS consult for CABG +/- AVR   Carotid artery duplex 12/04/2017: Minimal stenosis in the right internal carotid artery (1-15%). Minimal stenosis in the left internal carotid artery (1-15%). Antegrade right vertebral artery flow. Antegrade left vertebral artery flow. Follow up when clinically indicated.  Scheduled Meds:  amiodarone  200 mg Oral Daily   apixaban  5 mg Oral BID   aspirin  81 mg Oral Daily   atorvastatin  40 mg Oral Daily   bisacodyl  10 mg Oral Daily   Or   bisacodyl  10 mg Rectal Daily  chlorhexidine gluconate (MEDLINE KIT)  15 mL Mouth Rinse BID   dapagliflozin propanediol  10 mg Oral QAC breakfast   furosemide  40 mg Oral Daily   insulin aspart  0-15 Units Subcutaneous TID WC   magnesium oxide  400 mg Oral Daily   mouth rinse  15 mL Mouth Rinse QID   metoprolol tartrate  25 mg Oral BID   pantoprazole  40 mg Oral Daily   potassium chloride  20 mEq Oral Daily   sacubitril-valsartan  1 tablet Oral BID   sodium chloride flush  3 mL Intravenous Q12H    Continuous Infusions:  sodium chloride      PRN Meds: sodium chloride, acetaminophen, albuterol, calcium carbonate, metoprolol tartrate, ondansetron (ZOFRAN) IV, oxyCODONE, sodium chloride flush, traMADol   IMPRESSION & RECOMMENDATIONS: Keith Wagner is a 80 y.o. Caucasian male  whose past medical history and cardiac risk factors include: hx of aortic stenosis (s/p aortic valve replacement with a 21 mm Edwards InspirEase Resilia bioprosthetic valve), CAD s/p 2v CABG (LIMA to LAD and SVG to OM), HFpEF, sinus node dysfunction and paroxysmal Afib (post-op), Hypertension, type 2 diabetes mellitus, hyperlipidemia, advanced age.   Paroxysmal atrial fibrillation: Discovered postoperatively. Rate control: Metoprolol Rhythm control: Amiodarone Thromboembolic prophylaxis: Eliquis CHA2DS2-VASc SCORE is 5 which correlates to 7.2% risk of stroke per year (age, CAD, CHF, HTN). Converted back to normal sinus rhythm on 03/10/2021 approximately noon Has completed amiodarone 400 mg p.o. twice daily. Currently on amiodarone 200 mg p.o. daily.  We will transition him to 200 mg p.o. twice daily for 7 days and 200 mg p.o. daily thereafter with the intentions of keeping him in sinus rhythm.  Will reevaluate as outpatient.  Long-term antiarrhythmic medication: Currently on amiodarone. Will need longitudinal follow-up with regards to liver function, thyroid function, lung function and ophthalmological exams. Amiodarone started on 03/03/2021 No baseline TSH - will order to am labs.  AST / ALT w/n normal limits as of 02/27/2021  Long-term oral anticoagulation: Indication: Paroxysmal atrial fibrillation Patient is educated on the importance of being cognizant for signs of blood loss or if he gets injured despite the mechanism of injury should be evaluated for internal bleeding by going to the closest ER via EMS. Reemphasized risks/benefits/alternative to [facility].  Baseline hemoglobin prior to surgery 11.6 g/dL.  Longitudinal follow-up required.  Acute HFpEF, Stage C, NYHA Class II / III:  Improving.  Weight prior to surgery 77.1kg and today 81.8kg So far net -8L UOP.  Aldactone and Entresto held due to renal function.  Will transition Entresto to Losartan $RemoveBef'25mg'pubjuZDGKL$  po qday - starting tomorrow due  renal function and to focus on diuresis. Will transition back to [facility] as outpatient he was on 97/$RemoveB'103mg'glJnNirA$  po bid prior to surgery.  Continue Metoprolol, Farxiga and Lasix.  Would focus on maintaining NSR and diuresis and once euvolemic and renal function back to baseline will uptitrate GDMT - likely as outpatient. Incentive spirometry encouraged to reduced atelectasis and post-op complications.  Compression stockings - to help facilitate fluid mobilizations.  Add feeding supplements (I.e. Ensure) to help his nutrition status / increase oncotic pressure.  Ambulation as tolerated.   AKI:  Improving  Lasix reduced to once a day as of 12/17 Will transition Entresto to Losartan as of 12/19 (received [place] today).  Aldactone held for similar reasons.  Avoid nephrotoxic agents.   Postop anemia: Hemoglobin prior to admission 11.6 g/dL and currently 8.7 g/dL. Continue to monitor  Established CAD w/o angina pectoris and s/p  2 vessel CABG:  Anginal free.  Continue post-op care per CTS service - appreciate their guidance.  Continue ASA & statin  Check EKG last done on 12/10 Uptitrate GDMT as hemodynamics and labs allow.   Hx of aortic stenosis s/p 21 mm Edwards InspirEase Resilia bioprosthetic valve: Fingerprint echo 03/03/2021 Monitor for now.   Continue your care given his other chronic co-morbid conditions.   Patient's questions and concerns were addressed to his satisfaction. He voices understanding of the instructions provided during this encounter.   This note was created using a voice recognition software as a result there may be grammatical errors inadvertently enclosed that do not reflect the nature of this encounter. Every attempt is made to correct such errors.  Total time spent 35 minutes.   Mechele Claude [facility]  Pager: [phone] Office: [phone] 03/11/2021, 11:33 AM

## 2021-03-11 NOTE — Progress Notes (Signed)
Mobility Specialist Progress Note:   03/11/21 1036  Mobility  Activity Ambulated in hall  Level of Assistance Standby assist, set-up cues, supervision of patient - no hands on  Assistive Device Front wheel walker  Distance Ambulated (ft) 480 ft  Mobility Ambulated with assistance in hallway  Mobility Response Tolerated well  Mobility performed by Mobility specialist  Bed Position Chair  $Mobility charge 1 Mobility   Pt received in bed willing to participate in mobility. No complaints of pain and asymptomatic. Pt returned to chair with call bell in reach and all needs met.   [facility] Public librarian Phone [phone] Secondary Phone [phone]

## 2021-03-11 NOTE — Progress Notes (Addendum)
° °   °  [address]       [place] [address]             [phone]      8 Days Post-Op Procedure(s) (LRB): TEMPORARY PACEMAKER (N/A)  Subjective:  Patient doing well.  He has no complaints.  Did not get to ambulate yesterday.  Objective: Vital signs in last 24 hours: Temp:  [97.6 F (36.4 C)-98.5 F (36.9 C)] 98.2 F (36.8 C) (12/18 0747) Pulse Rate:  [60-92] 64 (12/18 0747) Cardiac Rhythm: Normal sinus rhythm (12/17 2100) Resp:  [13-20] 16 (12/18 0747) BP: (94-150)/(64-98) 150/98 (12/18 0747) SpO2:  [91 %-95 %] 91 % (12/18 0747) Weight:  [81.8 kg] 81.8 kg (12/18 0354)  Intake/Output from previous day: 12/17 0701 - 12/18 0700 In: -  Out: 1300 [Urine:1300] Intake/Output this shift: Total I/O In: 240 [P.O.:240] Out: 300 [Urine:300]  General appearance: alert, cooperative, and no distress Heart: regular rate and rhythm Lungs: clear to auscultation bilaterally Abdomen: soft, non-tender; bowel sounds normal; no masses,  no organomegaly Extremities: edema trace Wound: clean and dry  Lab Results: Recent Labs    03/09/21 0258  WBC 10.2  HGB 8.7*  HCT 28.0*  PLT 359   BMET:  Recent Labs    03/10/21 0113 03/11/21 0059  NA 135 137  K 3.9 4.1  CL 102 104  CO2 25 25  GLUCOSE 109* 99  BUN 29* 27*  CREATININE 1.52* 1.41*  CALCIUM 8.2* 8.4*    PT/INR: No results for input(s): LABPROT, INR in the last 72 hours. ABG    Component Value Date/Time   PHART 7.419 03/03/2021 1955   HCO3 22.6 03/03/2021 1955   TCO2 24 03/03/2021 1955   ACIDBASEDEF 2.0 03/03/2021 1955   O2SAT 62.9 03/04/2021 0838   CBG (last 3)  Recent Labs    03/10/21 1054 03/10/21 1636 03/11/21 0617  GLUCAP 121* 105* 105*    Assessment/Plan: S/P Procedure(s) (LRB): TEMPORARY PACEMAKER (N/A)  CV- PAF, converted to Sinus bradycardia around noon yesterday- continue Amiodarone, Lopressor, Entresto, Farxiga Pulm- no acute issues, he is off oxygen, continue IS Renal- his  creatinine level improved to 1.42, edema is improved on exam... will continue Lasix and potassium  4. Deconditioning- didn't ambulate yesterday, encouraged patient to do this today, home health arrangements have been arranged, I have ordered rolling walker per patient request 5. Dispo- patient maintaining Sinus Bradycardia since noon yesterday, his creatinine level has improved to 1.42, will continue lasix,potassium as volume status is improving.. patient needs to ambulate today, if he remains clinically stable will plan for d/c in AM  LOS: 10 days   Ellwood Handler, PA-C 03/11/2021   Chart reviewed, patient examined, agree with above. Rhythm is sinus 60.   Wt is about 15 lbs over preop if accurate. Still has some edema. Creat improved to 1.42. Continue diuresis.

## 2021-03-12 ENCOUNTER — Other Ambulatory Visit: Payer: Self-pay

## 2021-03-12 LAB — BASIC METABOLIC PANEL
Anion gap: 7 (ref 5–15)
BUN: 26 mg/dL — ABNORMAL HIGH (ref 8–23)
CO2: 24 mmol/L (ref 22–32)
Calcium: 8.3 mg/dL — ABNORMAL LOW (ref 8.9–10.3)
Chloride: 104 mmol/L (ref 98–111)
Creatinine, Ser: 1.41 mg/dL — ABNORMAL HIGH (ref 0.61–1.24)
GFR, Estimated: 51 mL/min — ABNORMAL LOW (ref >60)
Glucose, Bld: 99 mg/dL (ref 70–99)
Potassium: 4.2 mmol/L (ref 3.5–5.1)
Sodium: 135 mmol/L (ref 135–145)

## 2021-03-12 LAB — MAGNESIUM: Magnesium: 2.2 mg/dL (ref 1.7–2.4)

## 2021-03-12 MED ORDER — LOSARTAN POTASSIUM 25 MG PO TABS: 25.0000 mg | ORAL_TABLET | Freq: Every day | ORAL | 3 refills | Status: DC

## 2021-03-12 MED ORDER — APIXABAN 5 MG PO TABS: 5.0000 mg | ORAL_TABLET | Freq: Two times a day (BID) | ORAL | 3 refills | Status: DC

## 2021-03-12 MED ORDER — ATORVASTATIN CALCIUM 40 MG PO TABS: 40.0000 mg | ORAL_TABLET | Freq: Every day | ORAL | 3 refills | Status: AC

## 2021-03-12 MED ORDER — OXYCODONE HCL 5 MG PO TABS: 5.0000 mg | ORAL_TABLET | Freq: Four times a day (QID) | ORAL | 0 refills | Status: AC | PRN

## 2021-03-12 MED ORDER — AMIODARONE HCL 200 MG PO TABS: 200.0000 mg | ORAL_TABLET | Freq: Two times a day (BID) | ORAL | 1 refills | Status: DC

## 2021-03-12 MED ORDER — POTASSIUM CHLORIDE CRYS ER 20 MEQ PO TBCR: 20.0000 meq | EXTENDED_RELEASE_TABLET | Freq: Every day | ORAL | 0 refills | Status: DC

## 2021-03-12 MED ORDER — FUROSEMIDE 40 MG PO TABS: 40.0000 mg | ORAL_TABLET | Freq: Every day | ORAL | 0 refills | Status: DC

## 2021-03-12 MED ORDER — ENSURE ENLIVE PO LIQD: 237.0000 mL | Freq: Two times a day (BID) | ORAL | 12 refills | Status: DC

## 2021-03-12 NOTE — Care Management Important Message (Signed)
Important Message  Patient Details  Name: GRAYSON PFEFFERLE MRN: [id number] Date of Birth: [birth year]   Medicare Important Message Given:  Yes     Shelda Altes 03/12/2021, 10:04 AM

## 2021-03-12 NOTE — TOC Transition Note (Addendum)
Transition of Care [facility]) - CM/SW Discharge Note   Patient Details  Name: Keith Wagner MRN: [id number] Date of Birth: December 29, 1940  [facility] CM/SW Contact:  Pollie Friar, RN Phone Number: 03/12/2021, 1:13 PM   Clinical Narrative:    Patient is discharging home with home health services through [facility]. Cory with [facility] aware of d/c home today.  DME for home has been delivered to the room. Per prior CM note patient has 30 day coupon for Eliquis. Entresto stopped prior to discharge.  Pt has transport home.    Final next level of care: [place] Barriers to Discharge: No Barriers Identified   Patient Goals and CMS Choice Patient states their goals for this hospitalization and ongoing recovery are:: return home CMS Medicare.gov Compare Post Acute Care list provided to:: Patient Choice offered to / list presented to : Patient, Spouse  Discharge Placement                       Discharge Plan and Services   Discharge Planning Services: CM Consult Post Acute Care Choice: Durable Medical Equipment, Home Health          DME Arranged: 3-N-1, Walker rolling DME Agency: [employer] Date DME Agency Contacted: 03/12/21 Time DME Agency Contacted: [phone] Representative spoke with at DME Agency: Freda Munro HH Arranged: PT, RN, Disease Management [place] Agency: [place] Date [place]: 03/12/21 Time [place]: 1454 Representative spoke with at [place]: [place] Interventions     Readmission Risk Interventions No flowsheet data found.

## 2021-03-12 NOTE — Progress Notes (Signed)
Progress Note  Patient Name: Keith Wagner Date of Encounter: 03/12/2021  Attending physician: [name], MD Primary care provider: Aletha Halim., PA-C Primary Cardiologist: Rex Kras, DO, [facility]  Subjective: Keith Wagner is a 80 y.o. male who was seen and examined at bedside  Denies chest pain, orthopnea, paroxysmal nocturnal dyspnea lower extremity swelling Feels better. Remains in SR.   Objective: Vital Signs in the last 24 hours: Temp:  [97.8 F (36.6 C)-98.2 F (36.8 C)] 97.8 F (36.6 C) (12/19 1127) Pulse Rate:  [56-65] 60 (12/19 1127) Resp:  [11-20] 11 (12/19 1127) BP: (121-158)/(70-84) 127/84 (12/19 1127) SpO2:  [94 %-98 %] 96 % (12/19 1127)  Intake/Output:  Intake/Output Summary (Last 24 hours) at 03/12/2021 1222 Last data filed at 03/12/2021 0500 Gross per 24 hour  Intake --  Output 1500 ml  Net -1500 ml    Net IO Since Admission: -10,306.33 mL [03/12/21 1222]  Weights:  Filed Weights   03/09/21 0334 03/10/21 0320 03/11/21 0354  Weight: 81 kg 79.6 kg 81.8 kg    Telemetry: Personally reviewed.  Sinus rhythm with ectopy.  Physical examination: PHYSICAL EXAM: Vitals with BMI 03/12/2021 03/12/2021 03/12/2021  Height - - -  Weight - - -  BMI - - -  Systolic 025 852 778  Diastolic 84 70 81  Pulse 60 65 56    CONSTITUTIONAL: Appearing older than stated age, hemodynamically stable, no acute distress.  SKIN: Skin is warm and dry. No rash noted. No cyanosis. No pallor. No jaundice HEAD: Normocephalic and atraumatic.  EYES: No scleral icterus MOUTH/THROAT: Moist oral membranes.  NECK: No JVD present. No thyromegaly noted. No carotid bruits  LYMPHATIC: No visible cervical adenopathy.  CHEST Normal respiratory effort. No intercostal retractions.  Sternotomy site is healing well without erythema, drainage.  Granulation tissue present. LUNGS: Good inspiratory effort, clear to auscultation in the upper lung fields, decreased breath  sounds at the bases.  No wheezing, rhonchi's or rales.   CARDIOVASCULAR: Bradycardic, regular positive S1-S2, no murmurs rubs or gallops appreciated.  ABDOMINAL: Soft, nontender, nondistended, positive bowel sounds in all 4 quadrants, no apparent ascites.  EXTREMITIES: No pitting edema, warm to touch, 2+ DP and PT pulses HEMATOLOGIC: No significant bruising NEUROLOGIC: Oriented to person, place, and time. Nonfocal. Normal muscle tone.  PSYCHIATRIC: Normal mood and affect. Normal behavior. Cooperative  Lab Results: Hematology Recent Labs  Lab 03/06/21 0303 03/07/21 0334 03/09/21 0258  WBC 10.8* 9.5 10.2  RBC 3.16* 3.15* 3.47*  HGB 8.0* 8.0* 8.7*  HCT 25.0* 25.2* 28.0*  MCV 79.1* 80.0 80.7  MCH 25.3* 25.4* 25.1*  MCHC 32.0 31.7 31.1  RDW 18.1* 18.4* 18.9*  PLT 210 252 359    Chemistry Recent Labs  Lab 03/10/21 0113 03/11/21 0059 03/11/21 1246 03/12/21 0410  NA 135 137  --  135  K 3.9 4.1  --  4.2  CL 102 104  --  104  CO2 25 25  --  24  GLUCOSE 109* 99  --  99  BUN 29* 27*  --  26*  CREATININE 1.52* 1.41*  --  1.41*  CALCIUM 8.2* 8.4*  --  8.3*  PROT  --   --  6.2*  --   ALBUMIN  --   --  3.1*  --   AST  --   --  20  --   ALT  --   --  27  --   ALKPHOS  --   --  58  --  BILITOT  --   --  0.9  --   GFRNONAA 46* 51*  --  51*  ANIONGAP 8 8  --  7     Cardiac Enzymes: Cardiac Panel (last 3 results) No results for input(s): CKTOTAL, CKMB, TROPONINIHS, RELINDX in the last 72 hours.  BNP (last 3 results) Recent Labs    03/06/21 0303 03/07/21 0334 03/08/21 0119  BNP 700.7* 694.7* 767.4*    ProBNP (last 3 results) Recent Labs    12/22/20 0833 01/01/21 0930 01/30/21 0920  PROBNP 1,673* 1,231* 412     DDimer No results for input(s): DDIMER in the last 168 hours.   Hemoglobin A1c:  Lab Results  Component Value Date   HGBA1C 6.3 (H) 02/27/2021   MPG 134.11 02/27/2021    TSH  Recent Labs    03/11/21 1246  TSH 8.253*    Lipid Panel No results  found for: CHOL, TRIG, HDL, CHOLHDL, VLDL, LDLCALC, LDLDIRECT  Imaging: No results found.  CARDIAC DATABASE: CABG x2 on 03/01/2021: LIMA to LAD and SVG to OM and aortic valve replacement with a 21 mm Edwards InspirEase Resilia bioprosthetic valve.   Temporary Pacer Placement 03/03/2021:  Left IJ vein access uner ultrasound guidance.  Tempo balloontipped pacemaker was introduced into the right ventricular apex under direct visualization, the prongs were deployed and balloon deflated and V pacing obtained at MA of 1.0. Pacemaker left at demand VVI pace at 80 bpm with a MA of 5.0.  Patient tolerated procedure.  No immediate complication.  EKG: 12/01/2020: Normal sinus rhythm, 94 bpm, left axis deviation, left anterior fascicular block, LVH, nonspecific T wave abnormality, PVCs, without underlying injury pattern.   EKG 03/03/2021: Episodes of sinus arrest with junctional escape, PACs.  Nonspecific T abnormality.   Echocardiogram: 05/25/2019: LVEF 46-50% grade 1 diastolic impairment.  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  03/03/2021: LVEF 50-55%, low normal LVEF, no regional wall motion abnormalities, mild LVH, grade 2 diastolic dysfunction, elevated LVEDP, moderately dilated left atrium, trivial TR, dilated IVC, estimated RAP 15 mmHg.The aortic valve was not well visualized. Aortic valve regurgitation  is not visualized. Mild aortic valve stenosis. There is a 21 mm Edwards  Edwards InspirEase Resilia valve present in the aortic position. Aortic  valve area, by VTI measures 0.98 cm.   Aortic valve mean gradient measures 17.5 mmHg. Aortic valve Vmax measures 2.77 m/s.    Stress Testing: Treadmill Exercise stress 11/11/2014: Indication: Diabetes, HTN The patient exercised according to Bruce Protocol, Total time recorded  6:00 min  achieving max heart rate of  148 which was  100% of MPHR for age and  7.05 METS of work. Occasional PVC during exercise.  Normal BP response. Resting ECG showing NSR. There was no ST-T changes of ischemia with exercise stress test. No exercise induced arrythmias noted. Stress terminated due to [facility] met.   Heart Catheterization: LHC / RHC / Thoracic Aortogram 02/06/2021: LM: Distal calcific 75% stenosis LAD: Mid 80% stenosis LCX: large vessel. Ostial 70% stenosis.  RCA: Dominant, but small vessel. No significant disease seen on well opacified non-selective angiogram.   Normal PCW Unable to cross aortic valve due to severe aorta tortuosity.   CVTS consult for CABG +/- AVR   Carotid artery duplex 12/04/2017: Minimal stenosis in the right internal carotid artery (1-15%). Minimal stenosis in the left internal carotid artery (  1-15%). Antegrade right vertebral artery flow. Antegrade left vertebral artery flow. Follow up when clinically indicated.  Scheduled Meds:  amiodarone  200 mg Oral BID   [START ON 03/18/2021] amiodarone  200 mg Oral Daily   apixaban  5 mg Oral BID   aspirin  81 mg Oral Daily   atorvastatin  40 mg Oral Daily   bisacodyl  10 mg Oral Daily   Or   bisacodyl  10 mg Rectal Daily   chlorhexidine gluconate (MEDLINE KIT)  15 mL Mouth Rinse BID   dapagliflozin propanediol  10 mg Oral QAC breakfast   feeding supplement  237 mL Oral BID BM   furosemide  40 mg Oral Daily   insulin aspart  0-15 Units Subcutaneous TID WC   losartan  25 mg Oral Daily   magnesium oxide  400 mg Oral Daily   mouth rinse  15 mL Mouth Rinse QID   metoprolol tartrate  25 mg Oral BID   pantoprazole  40 mg Oral Daily   potassium chloride  20 mEq Oral Daily   sodium chloride flush  3 mL Intravenous Q12H    Continuous Infusions:  sodium chloride      PRN Meds: sodium chloride, acetaminophen, albuterol, calcium carbonate, metoprolol tartrate, ondansetron (ZOFRAN) IV, oxyCODONE, sodium chloride  flush, traMADol   IMPRESSION & RECOMMENDATIONS: Keith Wagner is a 80 y.o. Caucasian male whose past medical history and cardiac risk factors include: hx of aortic stenosis (s/p aortic valve replacement with a 21 mm Edwards InspirEase Resilia bioprosthetic valve), CAD s/p 2v CABG (LIMA to LAD and SVG to OM), HFpEF, sinus node dysfunction and paroxysmal Afib (post-op), Hypertension, type 2 diabetes mellitus, hyperlipidemia, advanced age.   Paroxysmal atrial fibrillation: Discovered postoperatively. Rate control: Metoprolol Rhythm control: Amiodarone Thromboembolic prophylaxis: Eliquis CHA2DS2-VASc SCORE is 5 which correlates to 7.2% risk of stroke per year (age, CAD, CHF, HTN). Converted back to normal sinus rhythm on 03/10/2021 approximately noon Has completed amiodarone 400 mg p.o. twice daily. Continue 200 mg p.o. twice daily for 7 days and 200 mg p.o. daily thereafter with the intentions of keeping him in sinus rhythm.  Will reevaluate as outpatient.  Long-term antiarrhythmic medication: Currently on amiodarone. Will need longitudinal follow-up with regards to liver function, thyroid function, lung function and ophthalmological exams. Amiodarone started on 03/03/2021 TSH - 8.25 02/2021.  AST / ALT w/n normal limits as of 02/27/2021  Long-term oral anticoagulation: Indication: Paroxysmal atrial fibrillation Patient is educated on the importance of being cognizant for signs of blood loss or if he gets injured despite the mechanism of injury should be evaluated for internal bleeding by going to the closest ER via EMS. Reemphasized risks/benefits/alternative to [facility].  Baseline hemoglobin prior to surgery 11.6 g/dL.  Longitudinal follow-up required.  Acute HFpEF, Stage C, NYHA Class II / III:  Improving.  Weight prior to surgery 77.1kg and today 81.8kg So far net -10L UOP.  Aldactone and Entresto held due to renal function.  Transitioned from Entresto to Losartan 59m po qday -  due  renal function and to focus on diuresis. [facility] was on 97/101mpo bid prior to surgery.  Continue Metoprolol, Farxiga and Lasix.  Would focus on maintaining NSR and diuresis and once euvolemic and renal function back to baseline will uptitrate GDMT - likely as outpatient. Incentive spirometry encouraged to reduced atelectasis and post-op complications.  Compression stockings - to help facilitate fluid mobilizations.  Add feeding supplements (I.e. Ensure)  to help his nutrition status / increase oncotic pressure.  Ambulation as tolerated.   AKI:  Improving  Lasix reduced to once a day as of 12/17 Transitioned from Entresto to Losartan Avoid nephrotoxic agents.   Postop anemia: Hemoglobin prior to admission 11.6 g/dL and currently 8.7 g/dL. Continue to monitor  Established CAD w/o angina pectoris and s/p 2 vessel CABG:  Anginal free.  Continue post-op care per CTS service - appreciate their guidance.  Continue ASA & statin  Check EKG last done on 12/10 Uptitrate GDMT as hemodynamics and labs allow.   Hx of aortic stenosis s/p 21 mm Edwards InspirEase Resilia bioprosthetic valve: Fingerprint echo 03/03/2021 Monitor for now.   Continue your care given his other chronic co-morbid conditions.   Patient's questions and concerns were addressed to his satisfaction. He voices understanding of the instructions provided during this encounter.   This note was created using a voice recognition software as a result there may be grammatical errors inadvertently enclosed that do not reflect the nature of this encounter. Every attempt is made to correct such errors.  Total time spent 25 minutes.   Mechele Claude [facility]  Pager: [phone] Office: [phone] 03/12/2021, 12:22 PM

## 2021-03-12 NOTE — Progress Notes (Signed)
CARDIAC REHAB PHASE I   Pt referred to [facility] [place] at request of wife.   Rufina Falco, RN BSN 03/12/2021 1:38 PM

## 2021-03-12 NOTE — Progress Notes (Signed)
CARDIAC REHAB PHASE I   PRE:  Rate/Rhythm: 14 SR  BP:  Sitting: 158/70      SaO2: 95 RA  MODE:  Ambulation: 300 ft   POST:  Rate/Rhythm: 87 SR  BP:  Sitting: 136/113    SaO2: 99 RA   Pt ambulated 370ft in hallway assist of one with front wheel walker. Pt returned to recliner. Reinforced d/c education. Pt requesting walker for home use, not yet delivered to bedside, CM aware. Pt denies further questions or concerns at this time. Referred to [facility] [place].  [phone] Rufina Falco, RN BSN 03/12/2021 9:45 AM

## 2021-03-12 NOTE — Progress Notes (Addendum)
[place]       [employer] [address]             [phone]      9 Days Post-Op Procedure(s) (LRB): TEMPORARY PACEMAKER (N/A) Subjective: Feels ok, no c/o  Objective: Vital signs in last 24 hours: Temp:  [98.1 F (36.7 C)-98.2 F (36.8 C)] 98.2 F (36.8 C) (12/19 0414) Pulse Rate:  [56-65] 56 (12/19 0414) Cardiac Rhythm: Atrial fibrillation (12/18 2233) Resp:  [16-20] 20 (12/19 0414) BP: (121-150)/(72-98) 137/81 (12/19 0414) SpO2:  [91 %-98 %] 94 % (12/19 0414)  Hemodynamic parameters for last 24 hours:    Intake/Output from previous day: 12/18 0701 - 12/19 0700 In: 240 [P.O.:240] Out: 1800 [Urine:1800] Intake/Output this shift: No intake/output data recorded.  General appearance: alert, cooperative, and no distress Heart: regular rate and rhythm Lungs: min dim left>right base Abdomen: benign Extremities: minor edema Wound: incis healing well  Lab Results: No results for input(s): WBC, HGB, HCT, PLT in the last 72 hours. BMET:  Recent Labs    03/11/21 0059 03/12/21 0410  NA 137 135  K 4.1 4.2  CL 104 104  CO2 25 24  GLUCOSE 99 99  BUN 27* 26*  CREATININE 1.41* 1.41*  CALCIUM 8.4* 8.3*    PT/INR: No results for input(s): LABPROT, INR in the last 72 hours. ABG    Component Value Date/Time   PHART 7.419 03/03/2021 1955   HCO3 22.6 03/03/2021 1955   TCO2 24 03/03/2021 1955   ACIDBASEDEF 2.0 03/03/2021 1955   O2SAT 62.9 03/04/2021 0838   CBG (last 3)  Recent Labs    03/11/21 0617 03/11/21 1123 03/11/21 1625  GLUCAP 105* 108* 128*    Meds Scheduled Meds:  amiodarone  200 mg Oral BID   [START ON 03/18/2021] amiodarone  200 mg Oral Daily   apixaban  5 mg Oral BID   aspirin  81 mg Oral Daily   atorvastatin  40 mg Oral Daily   bisacodyl  10 mg Oral Daily   Or   bisacodyl  10 mg Rectal Daily   chlorhexidine gluconate (MEDLINE KIT)  15 mL Mouth Rinse BID   dapagliflozin propanediol  10 mg Oral QAC breakfast   feeding  supplement  237 mL Oral BID BM   furosemide  40 mg Oral Daily   insulin aspart  0-15 Units Subcutaneous TID WC   losartan  25 mg Oral Daily   magnesium oxide  400 mg Oral Daily   mouth rinse  15 mL Mouth Rinse QID   metoprolol tartrate  25 mg Oral BID   pantoprazole  40 mg Oral Daily   potassium chloride  20 mEq Oral Daily   sodium chloride flush  3 mL Intravenous Q12H   Continuous Infusions:  sodium chloride     PRN Meds:.sodium chloride, acetaminophen, albuterol, calcium carbonate, metoprolol tartrate, ondansetron (ZOFRAN) IV, oxyCODONE, sodium chloride flush, traMADol  Xrays No results found.  Assessment/Plan: S/P Procedure(s) (LRB): TEMPORARY PACEMAKER (N/A)  1 afeb, S BP 120's-150's, afib episode with RVR,sinus with PVC's  on apixaban, amio, lopressor, entresto, farxiga 2 sats good on RA 3 weight  slowly trending sown good UOP, creat stable- volume status conts to improve 4 mobility improving 5 poss home today or tomorrow, will see what cardiology has to say if any further med changes needed     LOS: 11 days    John Giovanni PA-C Pager [phone] 03/12/2021  Patient seen  and examined, agree with above Home later today if OK with Cardiology  Porter. Roxan Hockey, MD [facility] [phone]

## 2021-03-12 NOTE — Progress Notes (Signed)
Removed CT sutures and applied benzoin and 1/2 " steri strips.  Pt tolerated procedure well. Pt given signs and symptoms of infection. Pt/family given discharge instructions, medication lists, follow up appointments, and when to call the doctor.  Pt/family verbalizes understanding. Payton Emerald, RN

## 2021-03-12 NOTE — Progress Notes (Signed)
Physical Therapy Treatment Patient Details Name: Keith Wagner MRN: [id number] DOB: 12/08/1940 Today's Date: 03/12/2021   History of Present Illness Pt is a 80 y/o s/p CABG X2 and AVR on 12/8. Pt had to have pacer placed on 12/10. PMH includes a fib and CAD.    PT Comments    Progressing well toward goals.  Emphasis on gait stability with RW and stair training as well as reinforcing education with pt/wife.    Recommendations for follow up therapy are one component of a multi-disciplinary discharge planning process, led by the attending physician.  Recommendations may be updated based on patient status, additional functional criteria and insurance authorization.  Follow Up Recommendations  Home health PT (CRP2 later)     Assistance Recommended at Discharge Intermittent Supervision/Assistance  Equipment Recommendations  None recommended by PT    Recommendations for Other Services       Precautions / Restrictions Precautions Precautions: Sternal     Mobility  Bed Mobility               General bed mobility comments: In chair upon entry    Transfers     Transfers: Sit to/from Stand Sit to Stand: Min guard           General transfer comment: Min guard for safety. Cues for hand placement to maintain sternal precautions.    Ambulation/Gait Ambulation/Gait assistance: Min guard Gait Distance (Feet): 200 Feet Assistive device: Rolling walker (2 wheels) Gait Pattern/deviations: Step-through pattern;Decreased step length - right;Decreased step length - left;Decreased stride length   Gait velocity interpretation: <1.8 ft/sec, indicate of risk for recurrent falls   General Gait Details: min guarde for safety and occasionally deviation   Stairs Stairs: Yes   Stair Management: One rail Left;Step to pattern;Forwards Number of Stairs: 5 General stair comments: cues for step to patterna due to weakness.  Needs minimal assist from family for  safety   Wheelchair Mobility    Modified Rankin (Stroke Patients Only)       Balance Overall balance assessment: Needs assistance Sitting-balance support: No upper extremity supported Sitting balance-Leahy Scale: Good     Standing balance support: Bilateral upper extremity supported Standing balance-Leahy Scale: Poor Standing balance comment: Reliant on BUE support                            Cognition Arousal/Alertness: Awake/alert Behavior During Therapy: WFL for tasks assessed/performed Overall Cognitive Status: Within Functional Limits for tasks assessed                                          Exercises      General Comments        Pertinent Vitals/Pain Pain Assessment: No/denies pain    Home Living                          Prior Function            PT Goals (current goals can now be found in the care plan section) Acute Rehab PT Goals PT Goal Formulation: With patient Time For Goal Achievement: 03/23/21 Potential to Achieve Goals: Good Progress towards PT goals: Progressing toward goals    Frequency    Min 3X/week      PT Plan Current plan remains appropriate    Co-evaluation  AM-PAC PT "6 Clicks" Mobility   Outcome Measure  Help needed turning from your back to your side while in a flat bed without using bedrails?: None Help needed moving from lying on your back to sitting on the side of a flat bed without using bedrails?: A Little Help needed moving to and from a bed to a chair (including a wheelchair)?: A Little Help needed standing up from a chair using your arms (e.g., wheelchair or bedside chair)?: A Little Help needed to walk in hospital room?: A Little Help needed climbing 3-5 steps with a railing? : A Little 6 Click Score: 19    End of Session   Activity Tolerance: Patient tolerated treatment well Patient left: in chair;with call bell/phone within reach Nurse  Communication: Mobility status PT Visit Diagnosis: Other abnormalities of gait and mobility (R26.89);Muscle weakness (generalized) (M62.81)     Time: 1448-1856 PT Time Calculation (min) (ACUTE ONLY): 26 min  Charges:  $Gait Training: 8-22 mins $Therapeutic Activity: 8-22 mins                     03/12/2021  Keith Wagner., PT Acute Rehabilitation Services [phone]  (pager) [phone]  (office)   Keith Wagner 03/12/2021, 3:37 PM

## 2021-03-15 ENCOUNTER — Other Ambulatory Visit: Payer: Self-pay

## 2021-03-15 ENCOUNTER — Ambulatory Visit: Payer: Medicare HMO | Admitting: Cardiology

## 2021-03-15 ENCOUNTER — Encounter: Payer: Self-pay | Admitting: Cardiology

## 2021-03-15 VITALS — BP 131/78 | HR 88 | Temp 97.2°F | Resp 17 | Ht 66.0 in | Wt 174.4 lb

## 2021-03-15 DIAGNOSIS — N179 Acute kidney failure, unspecified: Secondary | ICD-10-CM

## 2021-03-15 DIAGNOSIS — Z7901 Long term (current) use of anticoagulants: Secondary | ICD-10-CM

## 2021-03-15 DIAGNOSIS — Z951 Presence of aortocoronary bypass graft: Secondary | ICD-10-CM

## 2021-03-15 DIAGNOSIS — D649 Anemia, unspecified: Secondary | ICD-10-CM

## 2021-03-15 DIAGNOSIS — I2510 Atherosclerotic heart disease of native coronary artery without angina pectoris: Secondary | ICD-10-CM

## 2021-03-15 DIAGNOSIS — I5042 Chronic combined systolic (congestive) and diastolic (congestive) heart failure: Secondary | ICD-10-CM

## 2021-03-15 DIAGNOSIS — I480 Paroxysmal atrial fibrillation: Secondary | ICD-10-CM

## 2021-03-15 DIAGNOSIS — Z87891 Personal history of nicotine dependence: Secondary | ICD-10-CM

## 2021-03-15 DIAGNOSIS — Z79899 Other long term (current) drug therapy: Secondary | ICD-10-CM

## 2021-03-15 DIAGNOSIS — E782 Mixed hyperlipidemia: Secondary | ICD-10-CM

## 2021-03-15 DIAGNOSIS — Z952 Presence of prosthetic heart valve: Secondary | ICD-10-CM

## 2021-03-15 DIAGNOSIS — I10 Essential (primary) hypertension: Secondary | ICD-10-CM

## 2021-03-15 MED ORDER — FUROSEMIDE 20 MG PO TABS: 20.0000 mg | ORAL_TABLET | Freq: Every morning | ORAL | 0 refills | Status: DC

## 2021-03-15 MED ORDER — AMIODARONE HCL 200 MG PO TABS: 200.0000 mg | ORAL_TABLET | Freq: Two times a day (BID) | ORAL | 1 refills | Status: DC

## 2021-03-15 NOTE — Progress Notes (Signed)
ID:  DEFORREST BOGLE, DOB 1941-01-20, MRN [id number]  PCP:  Aletha Halim., PA-C  Cardiologist:  Rex Kras, DO, [facility] (established care 06/10/2019) Cardiothoracic surgeon: Dr. Roxan Hockey Former Cardiology Providers: Jeri Lager, APRN, FNP-C  Date: 03/15/21 Last Office Visit: 02/12/2021  Chief Complaint  Patient presents with   Coronary Artery Disease   Hospitalization Follow-up    2 week    HPI  Keith Wagner is a 80 y.o. 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  Patient is accompanied by his wife Keith Wagner) at today's visit.  Patient was being followed by the office given his underlying aortic stenosis; however, presented in September 2022 for sooner office visit due to shortness of breath, lower extremity swelling and decreased functional status.  Initially treated for pneumonia and then given diuretics with significant symptom improvement in referred back to cardiology for further evaluation.  At that time echocardiogram was performed which noted moderately reduced LVEF, grade 2 diastolic impairment, elevated LAP, and at least moderate aortic stenosis per dimensional index.  Patient was optimized on guideline directed medical therapy and once euvolemic underwent left and right heart catheterization.  Patient was found to have multivessel CAD and was referred to cardiothoracic  surgery for CABG plus or minus aortic valve replacement.  Patient was hospitalized from 03/01/2021 - 03/12/2021.  He underwent a CABG x2 with surgical anatomy notable for LIMA to the LAD and SVG to OM with bioprosthetic aortic valve replacement  with a 21 mm Edwards Inspiris Resilia Bioprosthetic valve.  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  Therefore, patient underwent temporary pacemaker placement by Dr. Einar Gip on 03/03/2021.  Over the next following days his patient requirements started to decrease and temporary pacer was removed on 03/06/2021.  Due to postoperative state, fluid shifts, poor oral intake, etc. patient has developed acute kidney injury and postoperative atrial fibrillation during his recent hospitalization.  Up titration of GDMT given his heart failure was difficult prior to discharge but some medications were initiated.  Since discharge patient states that he is doing well overall.  His daily weights have improved (stable/decreased).  His blood pressures are usually 120 mmHg.  Patient's heart rate is well controlled at home; however, his morning heart rate was 107 bpm which is unusual for him.  Currently ambulates with a cane.  Continues to have 2 pillow orthopnea which is chronic and stable.  No anginal discomfort but chest pain over the sternal site.  He has a follow-up appointment with cardiothoracic surgery on April 03, 2021.   ALLERGIES: No Known Allergies  MEDICATION LIST PRIOR TO VISIT: Current Meds  Medication Sig   acetaminophen (TYLENOL) 500 MG tablet Take 1,000 mg by mouth every 6 (six) hours as needed for moderate pain.   apixaban (ELIQUIS) 5 MG TABS tablet Take 1 tablet (5 mg total) by mouth 2 (two) times daily.   aspirin  81 MG tablet Take 81 mg by mouth daily.   atorvastatin (LIPITOR) 40 MG tablet Take 1 tablet (40 mg total) by mouth daily.   calcium carbonate  (TUMS - DOSED IN MG ELEMENTAL CALCIUM) 500 MG chewable tablet Chew 500-1,000 mg by mouth daily as needed for indigestion or heartburn.   clobetasol cream (TEMOVATE) 3.22 % Apply 1 application topically 2 (two) times daily as needed (eczema).   dapagliflozin propanediol (FARXIGA) 10 MG TABS tablet Take 1 tablet (10 mg total) by mouth daily before breakfast.   feeding supplement (ENSURE ENLIVE / ENSURE PLUS) LIQD Take 237 mLs by mouth 2 (two) times daily between meals.   losartan (COZAAR) 25 MG tablet Take 1 tablet (25 mg total) by mouth daily.   Lutein 6 MG TABS Take 6 mg by mouth daily.   magnesium oxide (MAG-OX) 400 MG tablet Take 1 tablet (400 mg total) by mouth daily.   metoprolol succinate (TOPROL-XL) 25 MG 24 hr tablet TAKE 1 TABLET BY MOUTH ONCE DAILY. HOLD IF TOP BLOOD PRESSURE NUMBER IS LESS THAN 100 MMHG OR PILSE LES THAN 60 BPM   Multiple Vitamins-Minerals (MULTIVITAMIN PO) Take 1 tablet by mouth daily.   Omega-3 Fatty Acids (FISH OIL) 1000 MG CAPS Take 1,000 mg by mouth daily.   OVER THE COUNTER MEDICATION Take 1 tablet by mouth daily. Super Beta Prostate Advanced   [EXPIRED] oxyCODONE (OXY IR/ROXICODONE) 5 MG immediate release tablet Take 1 tablet (5 mg total) by mouth every 6 (six) hours as needed for up to 7 days for severe pain.   potassium chloride SA (KLOR-CON M) 20 MEQ tablet Take 1 tablet (20 mEq total) by mouth daily.   triamcinolone cream (KENALOG) 0.1 % Apply 1 application topically 2 (two) times daily as needed (eczema).   [DISCONTINUED] amiodarone (PACERONE) 200 MG tablet Take 1 tablet (200 mg total) by mouth 2 (two) times daily. X 7 days, then decrease to 200 mg daily   [DISCONTINUED] furosemide (LASIX) 40 MG tablet Take 1 tablet (40 mg total) by mouth daily.     PAST MEDICAL HISTORY: Past Medical History:  Diagnosis Date   Arthritis    Asthma    uses inhaler PRN   Bilateral carotid bruits 06/12/2018   Cataract    Diabetes mellitus without complication ([place])    per  pt "pre-diabetic"- off Metformin- diet controlled    GERD (gastroesophageal reflux disease)    Gout    Heart murmur    Hypercholesteremia 06/12/2018   Hyperlipidemia    Hypertension    Mild aortic stenosis 06/12/2018    PAST SURGICAL HISTORY: Past Surgical History:  Procedure Laterality Date   AORTIC VALVE REPLACEMENT N/A 03/01/2021   Procedure: AORTIC VALVE REPLACEMENT (AVR) WITH INSPIRIS RESILIA  AORTIC VALVE SIZE 21MM;  Surgeon: [name], MD;  Location: [place];  Service: Open Heart Surgery;  Laterality: N/A;   CARDIAC CATHETERIZATION     CATARACT EXTRACTION Left    CHEST EXPLORATION  03/01/2021   Procedure: CHEST EXPLORATION;  Surgeon: [name], MD;  Location[facility] OR;  Service: Open Heart Surgery;;   COLONOSCOPY     CORONARY ARTERY BYPASS GRAFT N/A 03/01/2021   Procedure: CORONARY ARTERY BYPASS GRAFTING (CABG) X 2 USING LEFT INTERNAL MAMMARY ARTERY AND RIGHT ENDOSCOPIC GREATER SAPHENOUS VEIN CONDUITS;  Surgeon: [name], MD;  Location: [place];  Service: Open Heart Surgery;  Laterality: N/A;   ENDOVEIN HARVEST OF GREATER SAPHENOUS VEIN Right 03/01/2021   Procedure: ENDOVEIN HARVEST OF  GREATER SAPHENOUS VEIN;  Surgeon: [name], MD;  Location: [place];  Service: Open Heart Surgery;  Laterality: Right;   EYE SURGERY     POLYPECTOMY     RIGHT HEART CATH AND CORONARY ANGIOGRAPHY N/A 02/06/2021   Procedure: RIGHT HEART CATH AND CORONARY ANGIOGRAPHY;  Surgeon: Nigel Mormon, MD;  Location: [place] CV LAB;  Service: Cardiovascular;  Laterality: N/A;   TEE WITHOUT CARDIOVERSION N/A 03/01/2021   Procedure: TRANSESOPHAGEAL ECHOCARDIOGRAM (TEE);  Surgeon: [name], MD;  Location: [place];  Service: Open Heart Surgery;  Laterality: N/A;   TEMPORARY PACEMAKER N/A 03/03/2021   Procedure: TEMPORARY PACEMAKER;  Surgeon: Adrian Prows, MD;  Location: [place] [facility];  Service: Cardiovascular;  Laterality: N/A;   THORACIC AORTOGRAM N/A  02/06/2021   Procedure: THORACIC AORTOGRAM;  Surgeon: Nigel Mormon, MD;  Location: [place] [facility];  Service: Cardiovascular;  Laterality: N/A;    FAMILY HISTORY: No family history of premature coronary disease or sudden cardiac death.  SOCIAL HISTORY:  The patient  reports that he has quit smoking. His smoking use included cigars. He has never used smokeless tobacco. He reports that he does not drink alcohol and does not use drugs.  REVIEW OF SYSTEMS: Review of Systems  Constitutional: Negative for chills and fever.  HENT:  Negative for hoarse voice and nosebleeds.   Eyes:  Negative for discharge, double vision and pain.  Cardiovascular:  Positive for chest pain (sternotomy site) and leg swelling (improving). Negative for claudication, dyspnea on exertion, near-syncope, orthopnea, palpitations, paroxysmal nocturnal dyspnea and syncope.  Respiratory:  Negative for cough, hemoptysis and shortness of breath.   Musculoskeletal:  Negative for muscle cramps and myalgias.  Gastrointestinal:  Negative for abdominal pain, constipation, diarrhea, heartburn, hematemesis, hematochezia, melena, nausea and vomiting.  Neurological:  Negative for dizziness and light-headedness.   PHYSICAL EXAM: Vitals with BMI 03/15/2021 03/12/2021 03/12/2021  Height $Remov'5\' 6"'NmFrtQ$  - -  Weight 174 lbs 6 oz - -  BMI 03.88 - -  Systolic 828 003 491  Diastolic 78 84 70  Pulse 88 60 65   CONSTITUTIONAL: Appears older than stated age, hemodynamically stable, no acute distress.  SKIN: Skin is warm and dry. No rash noted. No cyanosis. No pallor. No jaundice HEAD: Normocephalic and atraumatic.  EYES: No scleral icterus MOUTH/THROAT: Moist oral membranes.  NECK: No JVD present. No thyromegaly noted. LYMPHATIC: No visible cervical adenopathy.  CHEST Normal respiratory effort. No intercostal retractions.  Sternotomy site is healing well. LUNGS: Clear to auscultation in the upper lung fields with decreased breath sounds  at the lower lung base.  No rales.  No stridor. No wheezes.  CARDIOVASCULAR: Irregularly irregular, variable S1-S2, ,soft holosystolic murmur heard at the apex, no gallops or rubs. ABDOMINAL: Soft, nontender, nondistended, positive bowel sounds in all 4 quadrants no apparent ascites.  EXTREMITIES: Trace bilateral peripheral edema, compression stockings present.  2+ DP and PT pulses. HEMATOLOGIC: No significant bruising NEUROLOGIC: Oriented to person, place, and time. Nonfocal. Normal muscle tone.  PSYCHIATRIC: Normal mood and affect. Normal behavior. Cooperative   CARDIAC DATABASE: CABG x2 on 03/01/2021: LIMA to LAD and SVG to OM and aortic valve replacement with a 21 mm Edwards InspirEase Resilia bioprosthetic valve.   Temporary Pacer Placement 03/03/2021.  EKG: 12/01/2020: Normal sinus rhythm, 94 bpm, left axis deviation, left anterior fascicular block, LVH, nonspecific T wave abnormality, PVCs, without underlying injury pattern.  03/03/2021: Episodes of sinus arrest with junctional escape, PACs.  Nonspecific T abnormality.  03/15/2021: Atrial fibrillation, 120 bpm, nonspecific IVCD, consider old anteroseptal infarct, ST-T changes in the high lateral and lateral leads suggestive of ischemia, without underlying injury pattern.  Echocardiogram: 05/25/2019: LVEF 50-55%, grade 1 diastolic impairment  12/07/2020: LVEF 30-35%, moderate LVH, global HK, grade 2 DD, elevated LAP, mild LAE, moderate AAS, moderate AR, moderate MR, mild TR, mild PHTN.  03/03/2021:  1. Left ventricular ejection fraction, by estimation, is 50 to 55%. Left ventricular ejection fraction by PLAX is 45 %. The left ventricle has low normal function. The left ventricle has no regional wall motion normalities. There is mild left ventricular hypertrophy. Left ventricular diastolic parameters are consistent with Grade II diastolic dysfunction (pseudonormalization). Elevated left ventricular end-diastolic pressure.   2. Right  ventricular systolic function is normal. The right ventricular size is normal.   3. Left atrial size was moderately dilated.   4. The mitral valve was not well visualized. Trivial mitral valve regurgitation.   5. The aortic valve was not well visualized. Aortic valve regurgitation is not visualized. Mild aortic valve stenosis. There is a 21 mm Edwards InspirEase Resilia valve present in the aortic position. Aortic valve area, by VTI measures 0.98 cm.  Aortic valve mean gradient measures 17.5 mmHg. Aortic valve Vmax measures 2.77 m/s.   6. The inferior vena cava is dilated in size with <50% respiratory variability, suggesting right atrial pressure of 15 mmHg.    Stress Testing: Treadmill Exercise stress 11/11/2014: Indication: Diabetes, HTN The patient exercised according to [place] Protocol, Total time recorded  6:00 min achieving max heart rate of  148 which was  100% of MPHR for age and  7.05 METS of work. Occasional PVC during exercise.  Normal BP response. Resting ECG showing NSR. There was no ST-T changes of ischemia with exercise stress test. No exercise induced arrythmias noted. Stress terminated due to [facility] met.  Heart Catheterization: LHC / RHC / Thoracic Aortogram 02/06/2021: LM: Distal calcific 75% stenosis LAD: Mid 80% stenosis LCX: large vessel. Ostial 70% stenosis.  RCA: Dominant, but small vessel. No significant disease seen on well opacified non-selective angiogram.   Normal PCW Unable to cross aortic valve due to severe aorta tortuosity.   CVTS consult for CABG +/- AVR  Carotid artery duplex 12/04/2017: Minimal stenosis in the right internal carotid artery (1-15%). Minimal stenosis in the left internal carotid artery (1-15%). Antegrade right vertebral artery flow. Antegrade left vertebral artery flow. Follow up when clinically indicated.  LABORATORY DATA: CBC Latest Ref Rng & Units 03/09/2021 03/07/2021 03/06/2021  WBC 4.0 - 10.5 K/uL 10.2 9.5 10.8(H)  Hemoglobin 13.0 -  17.0 g/dL 1.5(V) 9.5(F) 1.1(H)  Hematocrit 39.0 - 52.0 % 28.0(L) 25.2(L) 25.0(L)  Platelets 150 - 400 K/uL 359 252 210    CMP Latest Ref Rng & Units 03/12/2021 03/11/2021 03/10/2021  Glucose 70 - 99 mg/dL 99 99 565(I)  BUN 8 - 23 mg/dL 78(E) 24(I) 32(X)  Creatinine 0.61 - 1.24 mg/dL 8.51(F) 0.08(H) 8.78(Y)  Sodium 135 - 145 mmol/L 135 137 135  Potassium 3.5 - 5.1 mmol/L 4.2 4.1 3.9  Chloride 98 - 111 mmol/L 104 104 102  CO2 22 - 32 mmol/L 24 25 25   Calcium 8.9 - 10.3 mg/dL 8.3(L) 8.4(L) 8.2(L)  Total Protein 6.5 - 8.1 g/dL - 6.2(L) -  Total Bilirubin 0.3 - 1.2 mg/dL - 0.9 -  Alkaline Phos 38 - 126 U/L - 58 -  AST 15 - 41 U/L - 20 -  ALT 0 - 44 U/L - 27 -  Lipid Panel  No results found for: CHOL, TRIG, HDL, CHOLHDL, VLDL, LDLCALC, LDLDIRECT, LABVLDL  No components found for: NTPROBNP Recent Labs    12/08/20 0909 12/22/20 0833 01/01/21 0930 01/30/21 0920  PROBNP 1,668* 1,673* 1,231* 412   Recent Labs    03/11/21 1246  TSH 8.253*    BMP Recent Labs    03/10/21 0113 03/11/21 0059 03/12/21 0410  NA 135 137 135  K 3.9 4.1 4.2  CL 102 104 104  CO2 $Re'25 25 24  'GLs$ GLUCOSE 109* 99 99  BUN 29* 27* 26*  CREATININE 1.52* 1.41* 1.41*  CALCIUM 8.2* 8.4* 8.3*  GFRNONAA 46* 51* 51*    HEMOGLOBIN A1C Lab Results  Component Value Date   HGBA1C 6.3 (H) 02/27/2021   MPG 134.11 02/27/2021    11/29/2020 Sodium 142, potassium 4, chloride 105, bicarb 29, BUN 16, creatinine 1.23. Magnesium 2.4. BNP 1351  11/22/2020: Hemoglobin 11.8 g/dL, hematocrit 36.6%  IMPRESSION:    ICD-10-CM   1. Chronic combined systolic and diastolic congestive heart failure, NYHA class 2 (HCC)  I50.42 furosemide (LASIX) 20 MG tablet    Basic metabolic panel    Magnesium    Pro b natriuretic peptide (BNP)    2. S/P CABG x 2  Z95.1     3. Coronary artery disease involving native coronary artery of native heart without angina pectoris  I25.10 EKG 12-Lead    4. S/P Bioprosthetic AVR 21 mm Edward  InspirEase Resilia  Z95.2     5. Paroxysmal atrial fibrillation (HCC)  I48.0     6. Long term (current) use of anticoagulants  Z79.01     7. Long term current use of antiarrhythmic drug  Z79.899 amiodarone (PACERONE) 200 MG tablet    8. Benign essential hypertension  I10     9. Mixed hyperlipidemia  E78.2     10. Former smoker  Z87.891         RECOMMENDATIONS: Keith Wagner is a 80 y.o. 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  Chronic combined systolic and diastolic congestive heart failure, NYHA class 2 (HCC) Improving.  Weight prior to surgery 77.1kg and today 78.9kg Aldactone and Entresto held due to renal function.  Prior to surgery patient was on Entresto 97/103 mg p.o. twice daily.  Currently he is on losartan 25 mg p.o. daily.   Refill Lasix. Continue aspirin, atorvastatin, Farxiga, losartan, Toprol-XL. We will focus on maintaining normal sinus rhythm and diuresing for now and then uptitrate GDMT as hemodynamics and laboratory values allow. Blood work in 1 week to evaluate kidney function and electrolytes. Incentive spirometry encouraged to reduced atelectasis and post-op complications.  Compression stockings - to help facilitate fluid mobilizations.  Continue feeding supplements (I.e. Ensure) to help his nutrition status / increase oncotic pressure.   Coronary artery disease involving native coronary artery of native heart without angina pectoris, status post CABG x2 Chest pain-free Continue aspirin 81 mg p.o. daily Continue statin therapy. Sternotomy site is healing well without evidence of infection. Incentive spirometry  encouraged on daily basis. EKG today notes ST-T changes in the high lateral and lateral leads; however, patient denies any chest pain. May be rate related or secondary to nonspecific IVCD.  Monitor for now.  S/P Bioprosthetic AVR 21 mm Edward InspirEase Resilia Fingerprint echo 03/03/2021 Monitor for now.   Postoperative anemia Hemoglobin prior to admission in December 2022 11.6 g/dL. Most recent hemoglobin 03/09/2021 8.7 g/dL.  AKI (acute kidney injury) ([place]) Baseline creatinine approximately 0.8 mg/dL. Continue Lasix to facilitate diuresis.  Compression stockings reemphasized. Will reinitiate spironolactone once renal function is back to baseline. Will transition him back to [facility] once renal function is back to baseline. Continue [place] for now.  Paroxysmal atrial fibrillation (HCC) Discovered postoperatively. Rate control: Metoprolol. Rhythm control: Amiodarone. Thromboembolic prophylaxis: Eliquis CHA2DS2-VASc SCORE is 5 which correlates to 7.2% risk of stroke per year (age, CAD, CHF, HTN).  Long term (current) use of anticoagulants Indication: Paroxysmal atrial fibrillation. We emphasized the risks and benefits of oral anticoagulation in the setting of paroxysmal atrial fibrillation.  They are made aware of seeking medical attention if there are evidence of bleeding and/or injury. Will need monitoring of hemoglobin and hematocrit.  Long term current use of antiarrhythmic drug Currently on amiodarone.  Medications refilled. Will need longitudinal follow-up with regards to liver function, thyroid function, lung function and ophthalmological exams. Amiodarone started on 03/03/2021 TSH - 8.25 02/2021.  AST / ALT w/n normal limits as of 02/27/2021  Benign essential hypertension Office blood pressures are well controlled. Home blood pressures are well controlled. Medications reconciled  Mixed hyperlipidemia Currently on atorvastatin.   He denies myalgia or other side  effects.  FINAL MEDICATION LIST END OF ENCOUNTER: Meds ordered this encounter  Medications   amiodarone (PACERONE) 200 MG tablet    Sig: Take 1 tablet (200 mg total) by mouth 2 (two) times daily.    Dispense:  60 tablet    Refill:  1   furosemide (LASIX) 20 MG tablet    Sig: Take 1 tablet (20 mg total) by mouth every morning.    Dispense:  90 tablet    Refill:  0     Medications Discontinued During This Encounter  Medication Reason   amiodarone (PACERONE) 200 MG tablet    furosemide (LASIX) 40 MG tablet      Current Outpatient Medications:    acetaminophen (TYLENOL) 500 MG tablet, Take 1,000 mg by mouth every 6 (six) hours as needed for moderate pain., Disp: , Rfl:    apixaban (ELIQUIS) 5 MG TABS tablet, Take 1 tablet (5 mg total) by mouth 2 (two) times daily., Disp: 60 tablet, Rfl: 3   aspirin 81 MG tablet, Take 81 mg by mouth daily., Disp: , Rfl:    atorvastatin (LIPITOR) 40 MG tablet, Take 1 tablet (40 mg total) by mouth daily., Disp: 30 tablet, Rfl: 3   calcium carbonate (TUMS - DOSED IN MG ELEMENTAL CALCIUM) 500 MG chewable tablet, Chew 500-1,000 mg by mouth daily as needed for indigestion or heartburn., Disp: , Rfl:    clobetasol cream (TEMOVATE) 3.81 %, Apply 1 application topically 2 (two) times daily as needed (eczema)., Disp: , Rfl:    dapagliflozin propanediol (FARXIGA) 10 MG TABS tablet, Take 1 tablet (10 mg total) by mouth daily before breakfast., Disp: 90 tablet, Rfl: 0   feeding supplement (ENSURE ENLIVE / ENSURE PLUS) LIQD, Take 237 mLs by mouth 2 (two) times daily between meals., Disp: 237 mL, Rfl: 12   losartan (COZAAR) 25 MG tablet, Take 1 tablet (25 mg total) by mouth daily., Disp: 30 tablet, Rfl: 3   Lutein 6 MG TABS, Take 6 mg by mouth daily., Disp: , Rfl:    magnesium  oxide (MAG-OX) 400 MG tablet, Take 1 tablet (400 mg total) by mouth daily., Disp: 30 tablet, Rfl: 11   metoprolol succinate (TOPROL-XL) 25 MG 24 hr tablet, TAKE 1 TABLET BY MOUTH ONCE DAILY. HOLD  IF TOP BLOOD PRESSURE NUMBER IS LESS THAN 100 MMHG OR PILSE LES THAN 60 BPM, Disp: 30 tablet, Rfl: 3   Multiple Vitamins-Minerals (MULTIVITAMIN PO), Take 1 tablet by mouth daily., Disp: , Rfl:    Omega-3 Fatty Acids (FISH OIL) 1000 MG CAPS, Take 1,000 mg by mouth daily., Disp: , Rfl:    OVER THE COUNTER MEDICATION, Take 1 tablet by mouth daily. Super Beta Prostate Advanced, Disp: , Rfl:    potassium chloride SA (KLOR-CON M) 20 MEQ tablet, Take 1 tablet (20 mEq total) by mouth daily., Disp: 7 tablet, Rfl: 0   triamcinolone cream (KENALOG) 0.1 %, Apply 1 application topically 2 (two) times daily as needed (eczema)., Disp: , Rfl:    amiodarone (PACERONE) 200 MG tablet, Take 1 tablet (200 mg total) by mouth 2 (two) times daily., Disp: 60 tablet, Rfl: 1   furosemide (LASIX) 20 MG tablet, Take 1 tablet (20 mg total) by mouth every morning., Disp: 90 tablet, Rfl: 0  Orders Placed This Encounter  Procedures   Basic metabolic panel   Magnesium   Pro b natriuretic peptide (BNP)   EKG 12-Lead    There are no Patient Instructions on file for this visit.   --Continue cardiac medications as reconciled in final medication list. --Return in about 25 days (around 04/09/2021) for Follow up, A. fib, BP, CABG x 2. Or sooner if needed. --Continue follow-up with your primary care physician regarding the management of your other chronic comorbid conditions.  Patient's questions and concerns were addressed to his satisfaction. He voices understanding of the instructions provided during this encounter.   This note was created using a voice recognition software as a result there may be grammatical errors inadvertently enclosed that do not reflect the nature of this encounter. Every attempt is made to correct such errors.  Rex Kras, [place], [facility]  Pager: [phone] Office: [phone]

## 2021-03-20 ENCOUNTER — Other Ambulatory Visit: Payer: Self-pay

## 2021-03-20 DIAGNOSIS — I5042 Chronic combined systolic (congestive) and diastolic (congestive) heart failure: Secondary | ICD-10-CM

## 2021-03-26 ENCOUNTER — Encounter: Payer: Self-pay | Admitting: Cardiology

## 2021-03-27 ENCOUNTER — Other Ambulatory Visit: Payer: Self-pay | Admitting: Cardiology

## 2021-03-27 ENCOUNTER — Other Ambulatory Visit: Payer: Self-pay | Admitting: Thoracic Surgery (Cardiothoracic Vascular Surgery)

## 2021-03-27 DIAGNOSIS — Z952 Presence of prosthetic heart valve: Secondary | ICD-10-CM

## 2021-03-28 LAB — BASIC METABOLIC PANEL
BUN/Creatinine Ratio: 20 (ref 10–24)
BUN: 28 mg/dL — ABNORMAL HIGH (ref 8–27)
CO2: 23 mmol/L (ref 20–29)
Calcium: 9.7 mg/dL (ref 8.6–10.2)
Chloride: 101 mmol/L (ref 96–106)
Creatinine, Ser: 1.4 mg/dL — ABNORMAL HIGH (ref 0.76–1.27)
Glucose: 105 mg/dL — ABNORMAL HIGH (ref 70–99)
Potassium: 4.8 mmol/L (ref 3.5–5.2)
Sodium: 138 mmol/L (ref 134–144)
eGFR: 51 mL/min/{1.73_m2} — ABNORMAL LOW (ref >59)

## 2021-03-28 LAB — MAGNESIUM: Magnesium: 2.5 mg/dL — ABNORMAL HIGH (ref 1.6–2.3)

## 2021-03-28 LAB — PRO B NATRIURETIC PEPTIDE: NT-Pro BNP: 922 pg/mL — ABNORMAL HIGH (ref 0–486)

## 2021-04-01 ENCOUNTER — Other Ambulatory Visit: Payer: Self-pay | Admitting: Cardiology

## 2021-04-01 DIAGNOSIS — I5042 Chronic combined systolic (congestive) and diastolic (congestive) heart failure: Secondary | ICD-10-CM

## 2021-04-01 DIAGNOSIS — Z7901 Long term (current) use of anticoagulants: Secondary | ICD-10-CM

## 2021-04-01 DIAGNOSIS — N179 Acute kidney failure, unspecified: Secondary | ICD-10-CM

## 2021-04-02 NOTE — Progress Notes (Signed)
Called pt no answer, left a vm

## 2021-04-02 NOTE — Progress Notes (Signed)
Called and spoke to pt, pt voiced understanding.

## 2021-04-03 ENCOUNTER — Ambulatory Visit: Payer: Medicare HMO | Admitting: Thoracic Surgery (Cardiothoracic Vascular Surgery)

## 2021-04-05 ENCOUNTER — Ambulatory Visit (INDEPENDENT_AMBULATORY_CARE_PROVIDER_SITE_OTHER): Payer: Self-pay | Admitting: Thoracic Surgery (Cardiothoracic Vascular Surgery)

## 2021-04-05 ENCOUNTER — Other Ambulatory Visit: Payer: Self-pay

## 2021-04-05 ENCOUNTER — Encounter: Payer: Self-pay | Admitting: Thoracic Surgery (Cardiothoracic Vascular Surgery)

## 2021-04-05 ENCOUNTER — Ambulatory Visit
Admission: RE | Admit: 2021-04-05 | Discharge: 2021-04-05 | Disposition: A | Discharge status: Home / Self Care | Payer: Medicare HMO | Source: Ambulatory Visit | Attending: Thoracic Surgery (Cardiothoracic Vascular Surgery) | Admitting: Thoracic Surgery (Cardiothoracic Vascular Surgery)

## 2021-04-05 VITALS — BP 169/104 | HR 77 | Resp 20 | Ht 66.0 in | Wt 158.8 lb

## 2021-04-05 DIAGNOSIS — I2510 Atherosclerotic heart disease of native coronary artery without angina pectoris: Secondary | ICD-10-CM

## 2021-04-05 DIAGNOSIS — Z952 Presence of prosthetic heart valve: Secondary | ICD-10-CM

## 2021-04-05 DIAGNOSIS — Z951 Presence of aortocoronary bypass graft: Secondary | ICD-10-CM

## 2021-04-05 DIAGNOSIS — I350 Nonrheumatic aortic (valve) stenosis: Secondary | ICD-10-CM

## 2021-04-05 NOTE — Progress Notes (Signed)
[address]       [place] [address]             [phone]    HPI: Mr. Keith Wagner returns for a scheduled postoperative follow-up visit  Yu Peggs is a 81 year old man with a history of hypertension, hyperlipidemia, type 2 diabetes, asthma, gout, aortic stenosis, and class III congestive heart failure.  He was having issues with heart failure.  Ejection echocardiogram showed ejection fraction of 30 to 35%.  Aortic valve area was calculated a 0.9 cm.  Cardiac catheterization revealed left main and two-vessel coronary disease.  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  While the chest tubes were being suctioned out after closure bleeding was noted.  The sternotomy was reopened and there was a perforation of the right ventricle from the suction catheter, which was repaired.  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  He did not require permanent pacemaker.  He was finally discharged on 03/12/2021.  He did go home on amiodarone and Eliquis.  He has been feeling well.  He only took pain medication for a day or 2 after he went home.  Since then has only been using Tylenol.  He does complain of some soreness in the chest.  He is not having any swelling in his legs.  He says his blood pressure has been elevated.  He is working with Dr. Terri Skains on that issue.  Past Medical History:  Diagnosis Date   Arthritis    Asthma    uses inhaler PRN   Bilateral carotid bruits 06/12/2018   Cataract    Diabetes mellitus without complication ([place])    per pt "pre-diabetic"- off Metformin- diet controlled    GERD (gastroesophageal reflux disease)    Gout    Heart murmur    Hypercholesteremia 06/12/2018   Hyperlipidemia    Hypertension    Mild aortic stenosis 06/12/2018     Current Outpatient Medications  Medication Sig Dispense Refill   acetaminophen (TYLENOL) 500 MG tablet Take 1,000 mg by mouth  every 6 (six) hours as needed for moderate pain.     amiodarone (PACERONE) 200 MG tablet Take 1 tablet (200 mg total) by mouth 2 (two) times daily. 60 tablet 1   apixaban (ELIQUIS) 5 MG TABS tablet Take 1 tablet (5 mg total) by mouth 2 (two) times daily. 60 tablet 3   aspirin 81 MG tablet Take 81 mg by mouth daily.     atorvastatin (LIPITOR) 40 MG tablet Take 1 tablet (40 mg total) by mouth daily. 30 tablet 3   calcium carbonate (TUMS - DOSED IN MG ELEMENTAL CALCIUM) 500 MG chewable tablet Chew 500-1,000 mg by mouth daily as needed for indigestion or heartburn.     clobetasol cream (TEMOVATE) 3.81 % Apply 1 application topically 2 (two) times daily as needed (eczema).     dapagliflozin propanediol (FARXIGA) 10 MG TABS tablet Take 1 tablet (10 mg total) by mouth daily before breakfast. 90 tablet 0   feeding supplement (ENSURE ENLIVE / ENSURE PLUS) LIQD Take 237 mLs by mouth 2 (two) times daily between meals. 237 mL 12   furosemide (LASIX) 20 MG tablet Take 1 tablet (20 mg total) by mouth every morning. 90 tablet 0   losartan (COZAAR) 25 MG tablet Take 1 tablet (25 mg total) by mouth daily. 30 tablet 3   Lutein 6 MG TABS Take 6 mg by mouth  daily.     magnesium oxide (MAG-OX) 400 MG tablet Take 1 tablet (400 mg total) by mouth daily. 30 tablet 11   metoprolol succinate (TOPROL-XL) 25 MG 24 hr tablet TAKE 1 TABLET BY MOUTH ONCE DAILY. HOLD IF TOP BLOOD PRESSURE NUMBER IS LESS THAN 100 MMHG OR PILSE LES THAN 60 BPM 30 tablet 3   Multiple Vitamins-Minerals (MULTIVITAMIN PO) Take 1 tablet by mouth daily.     Omega-3 Fatty Acids (FISH OIL) 1000 MG CAPS Take 1,000 mg by mouth daily.     OVER THE COUNTER MEDICATION Take 1 tablet by mouth daily. Super Beta Prostate Advanced     potassium chloride SA (KLOR-CON M) 20 MEQ tablet Take 1 tablet (20 mEq total) by mouth daily. 7 tablet 0   triamcinolone cream (KENALOG) 0.1 % Apply 1 application topically 2 (two) times daily as needed (eczema).     No current  facility-administered medications for this visit.    Physical Exam BP (!) 169/104 (BP Location: Left Arm, Patient Position: Sitting, Cuff Size: Normal)    Pulse 77    Resp 20    Ht 5\' 6"  (1.676 m)    Wt 158 lb 12.8 oz (72 kg)    SpO2 96% Comment: RA   BMI 25.70 kg/m  81 year old man in no acute distress Alert and oriented x3 with no focal deficits Lungs clear bilaterally Cardiac regular rate and rhythm with a 2/6 systolic murmur Sternum stable, incision healing well No peripheral edema  Diagnostic Tests: I personally reviewed his chest x-ray.  Shows postoperative changes.  There is no effusion or infiltrate.  No evidence of congestive heart failure.  Impression: Keith Wagner is a 81 year old man with a history of hypertension, hyperlipidemia, type 2 diabetes, asthma, gout, aortic stenosis, and class III congestive heart failure.  He was having issues with heart failure.  Ejection echocardiogram showed ejection fraction of 30 to 35%.  Aortic valve area was calculated a 0.9 cm.  Cardiac catheterization revealed left main and two-vessel coronary disease.  Status post aortic valve replacement and coronary bypass grafting x 2-he is doing very well at this point in time.  He is having minimal discomfort.  No issues with angina.  Heart failure well compensated.  From a surgical standpoint he is fine to start cardiac rehabilitation.  He should not lift anything over 10 pounds for another 2 weeks and nothing over 20 pounds for another month.  Beyond that there are no restrictions on his activities.  He may begin driving on a limited basis.  Appropriate precautions were discussed.  Postoperative atrial fibrillation-in a regular rhythm today.  He is on amiodarone and apixaban.  Will defer to Dr. Terri Skains as to when those medications might be stopped.  Hypertension-blood pressure elevated.  He says it has been running high with a cuff at home, but when home health checks it it has been in the normal  range.  He has an appointment with Dr. Terri Skains next week so I did not make any interventions today.  Plan: Follow-up with Dr. Terri Skains as scheduled I will be happy to see Mr. Gucciardo back again anytime in the future if I can be of any further assistance with his care  [name], MD [facility] [phone]

## 2021-04-05 NOTE — Progress Notes (Signed)
Per Dr. Roxan Hockey patient ready to start outpatient Cardiac Rehab. Referral placed.

## 2021-04-11 ENCOUNTER — Ambulatory Visit: Payer: Medicare HMO | Admitting: Cardiology

## 2021-04-12 ENCOUNTER — Telehealth: Payer: Self-pay

## 2021-04-12 NOTE — Telephone Encounter (Signed)
Keith Wagner with [facility] home health care called and stated that the Pts BP was 180/90 yesterday. Today the wife reported that the pts BP was 150/90. They are unsure of what they should do. No symptoms, Pt has been taking medication as directed. Please advise.   Keith CatalinaAlvis Lemmings [phone]

## 2021-04-13 NOTE — Telephone Encounter (Signed)
Agree.  I would restart Entresto first.  Will await labs.   Melisha Eggleton [place], DO, [facility]

## 2021-04-13 NOTE — Telephone Encounter (Signed)
Called and reviewed readings with pt. BP readings continue to remain ~151/96, HR: 70.  Pt still waiting to get repeat labwork completed. Continues to do well with cardiac rehab without significant discomfort. Currently on losartan 25 mg, lasix 20 mg daily, farxiga 10 mg. Pt reports improvement in his SOB, dyspnea, and edema symptoms. Denies any complains of palpitations, lightheadedness, dizziness, syncope concerns. Pt reports that he will get his follow up labwork completed Monday. May consider transition from losartan back to previous home spironolactone and Entresto dose if renal function improves.

## 2021-04-17 ENCOUNTER — Other Ambulatory Visit: Payer: Self-pay | Admitting: Pharmacist

## 2021-04-17 DIAGNOSIS — I5042 Chronic combined systolic (congestive) and diastolic (congestive) heart failure: Secondary | ICD-10-CM

## 2021-04-17 LAB — HEMOGLOBIN AND HEMATOCRIT, BLOOD
Hematocrit: 37.8 % (ref 37.5–51.0)
Hemoglobin: 11.2 g/dL — ABNORMAL LOW (ref 13.0–17.7)

## 2021-04-17 LAB — BASIC METABOLIC PANEL
BUN/Creatinine Ratio: 17 (ref 10–24)
BUN: 23 mg/dL (ref 8–27)
CO2: 24 mmol/L (ref 20–29)
Calcium: 9.8 mg/dL (ref 8.6–10.2)
Chloride: 99 mmol/L (ref 96–106)
Creatinine, Ser: 1.36 mg/dL — ABNORMAL HIGH (ref 0.76–1.27)
Glucose: 109 mg/dL — ABNORMAL HIGH (ref 70–99)
Potassium: 4.8 mmol/L (ref 3.5–5.2)
Sodium: 144 mmol/L (ref 134–144)
eGFR: 53 mL/min/{1.73_m2} — ABNORMAL LOW (ref >59)

## 2021-04-17 LAB — MAGNESIUM: Magnesium: 2.4 mg/dL — ABNORMAL HIGH (ref 1.6–2.3)

## 2021-04-17 LAB — PRO B NATRIURETIC PEPTIDE: NT-Pro BNP: 739 pg/mL — ABNORMAL HIGH (ref 0–486)

## 2021-04-17 MED ORDER — ENTRESTO 49-51 MG PO TABS: 1.0000 | ORAL_TABLET | Freq: Two times a day (BID) | ORAL | 1 refills | Status: DC

## 2021-04-17 NOTE — Telephone Encounter (Signed)
Repeat labs reviewed. Renal function continues to remain stable Electrolytes within normal limits. Home BP readings continues to remain elevated. Okay to consider switching losartan back to entresto?   Pt was previously taking Entresto 97/103 mg BID. Currently only taking losartan 25 mg. May consider starting pt on Entresto 49/51 mg or restarting back on 24/26 mg BID and titrating back up as tolerated.

## 2021-04-17 NOTE — Telephone Encounter (Signed)
Repeat labs reviewed. Please reinitiate Entresto 49/51 mg p.o. twice daily.  Discontinue losartan.  Labs in 1 week to reevaluate kidney function and electrolytes.  Continue to monitor blood pressures.  Tripp Goins [place], DO, [facility]

## 2021-04-27 LAB — BASIC METABOLIC PANEL
BUN/Creatinine Ratio: 12 (ref 10–24)
BUN: 16 mg/dL (ref 8–27)
CO2: 20 mmol/L (ref 20–29)
Calcium: 9.3 mg/dL (ref 8.6–10.2)
Chloride: 103 mmol/L (ref 96–106)
Creatinine, Ser: 1.31 mg/dL — ABNORMAL HIGH (ref 0.76–1.27)
Glucose: 111 mg/dL — ABNORMAL HIGH (ref 70–99)
Potassium: 4.9 mmol/L (ref 3.5–5.2)
Sodium: 140 mmol/L (ref 134–144)
eGFR: 55 mL/min/{1.73_m2} — ABNORMAL LOW (ref >59)

## 2021-04-27 LAB — MAGNESIUM: Magnesium: 2.4 mg/dL — ABNORMAL HIGH (ref 1.6–2.3)

## 2021-04-27 LAB — PRO B NATRIURETIC PEPTIDE: NT-Pro BNP: 484 pg/mL (ref 0–486)

## 2021-05-01 ENCOUNTER — Encounter (HOSPITAL_COMMUNITY)
Admission: RE | Admit: 2021-05-01 | Discharge: 2021-05-01 | Disposition: A | Discharge status: Home / Self Care | Payer: Medicare HMO | Source: Ambulatory Visit | Attending: Cardiology | Admitting: Cardiology

## 2021-05-01 ENCOUNTER — Encounter (HOSPITAL_COMMUNITY): Payer: Self-pay

## 2021-05-01 VITALS — BP 116/60 | Ht 66.0 in | Wt 161.2 lb

## 2021-05-01 DIAGNOSIS — Z79899 Other long term (current) drug therapy: Secondary | ICD-10-CM | POA: Insufficient documentation

## 2021-05-01 DIAGNOSIS — Z951 Presence of aortocoronary bypass graft: Secondary | ICD-10-CM | POA: Insufficient documentation

## 2021-05-01 DIAGNOSIS — Z952 Presence of prosthetic heart valve: Secondary | ICD-10-CM | POA: Insufficient documentation

## 2021-05-01 LAB — GLUCOSE, CAPILLARY: Glucose-Capillary: 106 mg/dL — ABNORMAL HIGH (ref 70–99)

## 2021-05-01 NOTE — Progress Notes (Signed)
Spoke to patient he stated he is not having sob and his swelling has gone down and pt is aware of results

## 2021-05-01 NOTE — Progress Notes (Signed)
Cardiac Individual Treatment Plan  Patient Details  Name: Keith Wagner MRN: [id number] Date of Birth: August 25, 1940 Referring Provider:   Flowsheet Row CARDIAC REHAB PHASE II ORIENTATION from 05/01/2021 in [place]  Referring Provider Dr. 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)  Patient's Home Medications on Admission:  Current Outpatient Medications:    acetaminophen (TYLENOL) 500 MG tablet, Take 1,000 mg by mouth every 6 (six) hours as needed for moderate pain., Disp: , Rfl:    amiodarone (PACERONE) 200 MG tablet, Take 1 tablet (200 mg total) by mouth 2 (two) times daily., Disp: 60 tablet, Rfl: 1   apixaban (ELIQUIS) 5 MG TABS tablet, Take 1 tablet (5 mg total) by mouth 2 (two) times daily., Disp: 60 tablet, Rfl: 3   aspirin 81 MG tablet, Take 81 mg by mouth daily., Disp: , Rfl:    atorvastatin (LIPITOR) 40 MG tablet, Take 1 tablet (40 mg total) by mouth daily., Disp: 30 tablet, Rfl: 3   calcium carbonate (TUMS - DOSED IN MG ELEMENTAL CALCIUM) 500 MG chewable tablet, Chew 500-1,000 mg by mouth daily as needed for indigestion or heartburn., Disp: , Rfl:    clobetasol cream (TEMOVATE) 7.35 %, Apply 1 application topically 2 (two) times daily as needed (eczema)., Disp: , Rfl:    feeding supplement (ENSURE ENLIVE / ENSURE PLUS) LIQD, Take 237 mLs by mouth 2 (two) times daily between meals., Disp: 237 mL, Rfl: 12   furosemide (LASIX) 20 MG tablet, Take 1 tablet (20 mg total) by mouth every morning., Disp: 90 tablet, Rfl: 0   Lutein 6 MG TABS, Take 6 mg by mouth daily., Disp: , Rfl:    magnesium oxide (MAG-OX) 400 MG tablet, Take 1 tablet (400 mg total) by mouth daily., Disp: 30 tablet, Rfl: 11   metoprolol succinate (TOPROL-XL) 25 MG 24 hr tablet, TAKE 1 TABLET BY MOUTH  ONCE DAILY. HOLD IF TOP BLOOD PRESSURE NUMBER IS LESS THAN 100 MMHG OR PILSE LES THAN 60 BPM (Patient taking differently: Take 25 mg by mouth daily. HOLD IF TOP BLOOD PRESSURE NUMBER IS LESS THAN 100 MMHG OR PILSE LES THAN 60 BPM), Disp: 30 tablet, Rfl: 3   Multiple Vitamins-Minerals (MULTIVITAMIN PO), Take 1 tablet by mouth daily., Disp: , Rfl:    Omega-3 Fatty Acids (FISH OIL) 1000 MG CAPS, Take 1,000 mg by mouth daily., Disp: , Rfl:    OVER THE COUNTER MEDICATION, Take 1 tablet by mouth daily. Super Beta Prostate Advanced, Disp: , Rfl:    potassium chloride SA (KLOR-CON M) 20 MEQ tablet, Take 1 tablet (20 mEq total) by mouth daily., Disp: 7 tablet, Rfl: 0   sacubitril-valsartan (ENTRESTO) 49-51 MG, Take 1 tablet by mouth 2 (two) times daily., Disp: 60 tablet, Rfl: 1   triamcinolone cream (KENALOG) 0.1 %, Apply 1 application topically 2 (two) times daily as needed (eczema)., Disp: , Rfl:   Past Medical History: Past Medical History:  Diagnosis Date   Arthritis    Asthma    uses inhaler PRN   Bilateral carotid bruits 06/12/2018   Cataract    Diabetes mellitus without complication ([place])    per pt "pre-diabetic"- off Metformin- diet controlled    GERD (gastroesophageal reflux disease)    Gout    Heart  murmur    Hypercholesteremia 06/12/2018   Hyperlipidemia    Hypertension    Mild aortic stenosis 06/12/2018    Tobacco Use: Social History   Tobacco Use  Smoking Status Former   Types: Cigars  Smokeless Tobacco Never    Labs: Recent Review Flowsheet Data     Labs for ITP Cardiac and Pulmonary Rehab Latest Ref Rng & Units 03/03/2021 03/03/2021 03/03/2021 03/03/2021 03/04/2021   Hemoglobin A1c 4.8 - 5.6 % - - - - -   PHART 7.350 - 7.450 - 7.360 7.362 7.419 -   PCO2ART 32.0 - 48.0 mmHg - 28.7(L) 40.0 34.7 -   HCO3 20.0 - 28.0 mmol/L - 16.2(L) 22.7 22.6 -   TCO2 22 - 32 mmol/L - 17(L) 24 24 -   ACIDBASEDEF 0.0 - 2.0 mmol/L - 8.0(H) 3.0(H) 2.0 -   O2SAT % 52.3 78.0 59.0 97.0 62.9        Capillary Blood Glucose: Lab Results  Component Value Date   GLUCAP 106 (H) 05/01/2021   GLUCAP 128 (H) 03/11/2021   GLUCAP 108 (H) 03/11/2021   GLUCAP 105 (H) 03/11/2021   GLUCAP 105 (H) 03/10/2021     Exercise Target Goals: Exercise Program Goal: Individual exercise prescription set using results from initial 6 min walk test and THRR while considering  patients activity barriers and safety.   Exercise Prescription Goal: Starting with aerobic activity 30 plus minutes a day, 3 days per week for initial exercise prescription. Provide home exercise prescription and guidelines that participant acknowledges understanding prior to discharge.  Activity Barriers & Risk Stratification:  Activity Barriers & Cardiac Risk Stratification - 05/01/21 0825       Activity Barriers & Cardiac Risk Stratification   Activity Barriers None    Cardiac Risk Stratification High             6 Minute Walk:  6 Minute Walk     Row Name 05/01/21 1013         6 Minute Walk   Phase Initial     Distance 1000 feet     Walk Time 6 minutes     # of Rest Breaks 1     MPH 1.9     METS 1.84     RPE 12     VO2 Peak 6.42     Symptoms Yes (comment)     Comments one standing rest break 15 sec     Resting HR 62 bpm     Resting BP 116/60     Resting Oxygen Saturation  97 %     Exercise Oxygen Saturation  during 6 min walk 98 %     Max Ex. HR 90 bpm     Max Ex. BP 128/65     2 Minute Post BP 118/63              Oxygen Initial Assessment:   Oxygen Re-Evaluation:   Oxygen Discharge (Final Oxygen Re-Evaluation):   Initial Exercise Prescription:  Initial Exercise Prescription - 05/01/21 1000       Date of Initial Exercise RX and Referring Provider   Date 05/01/21    Referring Provider Dr. Roxan Hockey    Expected Discharge Date 07/20/21      Treadmill   MPH 1.4    Grade 0    Minutes 17      NuStep   Level 1    SPM 60    Minutes 22      Prescription Details  Frequency (times per week) 3    Duration Progress to 30 minutes of continuous aerobic without signs/symptoms of physical distress      Intensity   THRR 40-80% of Max Heartrate 56-113    Ratings of Perceived Exertion 11-13    Perceived Dyspnea 0-4      Resistance Training   Training Prescription Yes    Weight 3    Reps 10-15             Perform Capillary Blood Glucose checks as needed.  Exercise Prescription Changes:   Exercise Comments:   Exercise Goals and Review:   Exercise Goals     Row Name 05/01/21 1017             Exercise Goals   Increase Physical Activity Yes       Intervention Provide advice, education, support and counseling about physical activity/exercise needs.;Develop an individualized exercise prescription for aerobic and resistive training based on initial evaluation findings, risk stratification, comorbidities and participant's personal goals.       Expected Outcomes Short Term: Attend rehab on a regular basis to increase amount of physical activity.;Long Term: Add in home exercise to make exercise part of routine and to increase amount of physical activity.;Long Term: Exercising regularly at least 3-5 days a week.       Increase Strength and Stamina Yes       Intervention Provide advice, education, support and counseling about physical activity/exercise needs.;Develop an individualized exercise prescription for aerobic and resistive training based on initial evaluation findings, risk stratification, comorbidities and participant's personal goals.       Expected Outcomes Short Term: Increase workloads from initial exercise prescription for resistance, speed, and METs.;Short Term: Perform resistance training exercises routinely during rehab and add in resistance training at home;Long Term: Improve cardiorespiratory fitness, muscular endurance and strength as measured by increased METs and functional capacity (6MWT)       Able to understand and use rate of  perceived exertion (RPE) scale Yes       Intervention Provide education and explanation on how to use RPE scale       Expected Outcomes Short Term: Able to use RPE daily in rehab to express subjective intensity level;Long Term:  Able to use RPE to guide intensity level when exercising independently       Knowledge and understanding of Target Heart Rate Range (THRR) Yes       Intervention Provide education and explanation of THRR including how the numbers were predicted and where they are located for reference       Expected Outcomes Short Term: Able to state/look up THRR;Long Term: Able to use THRR to govern intensity when exercising independently;Short Term: Able to use daily as guideline for intensity in rehab       Able to check pulse independently Yes       Intervention Provide education and demonstration on how to check pulse in carotid and radial arteries.;Review the importance of being able to check your own pulse for safety during independent exercise       Expected Outcomes Short Term: Able to explain why pulse checking is important during independent exercise;Long Term: Able to check pulse independently and accurately       Understanding of Exercise Prescription Yes       Intervention Provide education, explanation, and written materials on patient's individual exercise prescription       Expected Outcomes Short Term: Able to explain program exercise prescription;Long Term: Able to  explain home exercise prescription to exercise independently                Exercise Goals Re-Evaluation :    Discharge Exercise Prescription (Final Exercise Prescription Changes):   Nutrition:  Target Goals: Understanding of nutrition guidelines, daily intake of sodium 1500mg , cholesterol 200mg , calories 30% from fat and 7% or less from saturated fats, daily to have 5 or more servings of fruits and vegetables.  Biometrics:  Pre Biometrics - 05/01/21 1018       Pre Biometrics   Height 5\' 6"   (1.676 m)    Weight 73.1 kg    Waist Circumference 40.5 inches    Hip Circumference 38 inches    Waist to Hip Ratio 1.07 %    BMI (Calculated) 26.02    Triceps Skinfold 22 mm    % Body Fat 29.4 %    Grip Strength 28.6 kg    Flexibility 0 in    Single Leg Stand 5 seconds              Nutrition Therapy Plan and Nutrition Goals:  Nutrition Therapy & Goals - 05/01/21 0947       Personal Nutrition Goals   Comments Patient scored 9 on his diet assessment. Scores discussed with he and his wife. Handout provided and explained regarding heart healthy nutrition and  DM information since he is taking [place] and has a history of DM. He and his wife verbalized understanding. We offer 2 educational sessions regarding heart healthy nutrition with handouts and assistance with RD referral. His wife has patient following a low sodium, low fat, low carb diet.             Nutrition Assessments:  Nutrition Assessments - 05/01/21 0946       MEDFICTS Scores   Pre Score 9            MEDIFICTS Score Key: ?70 Need to make dietary changes  40-70 Heart Healthy Diet ? 40 Therapeutic Level Cholesterol Diet   Picture Your Plate Scores: <01 Unhealthy dietary pattern with much room for improvement. 41-50 Dietary pattern unlikely to meet recommendations for good health and room for improvement. 51-60 More healthful dietary pattern, with some room for improvement.  >60 Healthy dietary pattern, although there may be some specific behaviors that could be improved.    Nutrition Goals Re-Evaluation:   Nutrition Goals Discharge (Final Nutrition Goals Re-Evaluation):   Psychosocial: Target Goals: Acknowledge presence or absence of significant depression and/or stress, maximize coping skills, provide positive support system. Participant is able to verbalize types and ability to use techniques and skills needed for reducing stress and depression.  Initial Review & Psychosocial Screening:   Initial Psych Review & Screening - 05/01/21 0953       Initial Review   Current issues with None Identified      Family Dynamics   Good Support System? Yes      Barriers   Psychosocial barriers to participate in program The patient should benefit from training in stress management and relaxation.;There are no identifiable barriers or psychosocial needs.      Screening Interventions   Interventions Encouraged to exercise             Quality of Life Scores:  Quality of Life - 05/01/21 1019       Quality of Life   Select Quality of Life      Quality of Life Scores   Health/Function Pre 30 %  Socioeconomic Pre 30 %    Psych/Spiritual Pre 30 %    Family Pre 30 %    GLOBAL Pre 30 %            Scores of 19 and below usually indicate a poorer quality of life in these areas.  A difference of  2-3 points is a clinically meaningful difference.  A difference of 2-3 points in the total score of the Quality of Life Index has been associated with significant improvement in overall quality of life, self-image, physical symptoms, and general health in studies assessing change in quality of life.  PHQ-9: Recent Review Flowsheet Data     Depression screen [facility] 2/9 05/01/2021   Decreased Interest 0   Down, Depressed, Hopeless 0   PHQ - 2 Score 0   Altered sleeping 1   Tired, decreased energy 0   Change in appetite 0   Feeling bad or failure about yourself  0   Trouble concentrating 0   Moving slowly or fidgety/restless 0   Suicidal thoughts 0   PHQ-9 Score 1   Difficult doing work/chores Not difficult at all      Interpretation of Total Score  Total Score Depression Severity:  1-4 = Minimal depression, 5-9 = Mild depression, 10-14 = Moderate depression, 15-19 = Moderately severe depression, 20-27 = Severe depression   Psychosocial Evaluation and Intervention:  Psychosocial Evaluation - 05/01/21 0954       Psychosocial Evaluation & Interventions   Interventions  Relaxation education;Stress management education    Comments Patient hs no psychosocial barriers or issues identified at his orientation visit. His initial PHQ-9 score was 1 due to not being able to stay asleep. He lives with his wife of 77 years who was present with him during his visit. He names her as his support as well as 2 brothers and their wives. He denies any depression, anxiety or stress and has been in good health until his aortic valve stenosis. He was very active prior to his surgery playing golf and doing his own yard work and having a relatively large vegetable garden every year. He is ready to start the program hoping to be able to do these things again in the Spring.    Expected Outcomes Patient wil continue to have no psychosocial barriers identified.    Continue Psychosocial Services  No Follow up required             Psychosocial Re-Evaluation:   Psychosocial Discharge (Final Psychosocial Re-Evaluation):   Vocational Rehabilitation: Provide vocational rehab assistance to qualifying candidates.   Vocational Rehab Evaluation & Intervention:  Vocational Rehab - 05/01/21 0950       Initial Vocational Rehab Evaluation & Intervention   Assessment shows need for Vocational Rehabilitation No      Vocational Rehab Re-Evaulation   Comments Patient is retired and does not need vocational rehab.             Education: Education Goals: Education classes will be provided on a weekly basis, covering required topics. Participant will state understanding/return demonstration of topics presented.  Learning Barriers/Preferences:   Education Topics: Hypertension, Hypertension Reduction -Define heart disease and high blood pressure. Discus how high blood pressure affects the body and ways to reduce high blood pressure.   Exercise and Your Heart -Discuss why it is important to exercise, the FITT principles of exercise, normal and abnormal responses to exercise, and how  to exercise safely.   Angina -Discuss definition of angina, causes of  angina, treatment of angina, and how to decrease risk of having angina.   Cardiac Medications -Review what the following cardiac medications are used for, how they affect the body, and side effects that may occur when taking the medications.  Medications include Aspirin, Beta blockers, calcium channel blockers, ACE Inhibitors, angiotensin receptor blockers, diuretics, digoxin, and antihyperlipidemics.   Congestive Heart Failure -Discuss the definition of CHF, how to live with CHF, the signs and symptoms of CHF, and how keep track of weight and sodium intake.   Heart Disease and Intimacy -Discus the effect sexual activity has on the heart, how changes occur during intimacy as we age, and safety during sexual activity.   Smoking Cessation / COPD -Discuss different methods to quit smoking, the health benefits of quitting smoking, and the definition of COPD.   Nutrition I: Fats -Discuss the types of cholesterol, what cholesterol does to the heart, and how cholesterol levels can be controlled.   Nutrition II: Labels -Discuss the different components of food labels and how to read food label   Heart Parts/Heart Disease and PAD -Discuss the anatomy of the heart, the pathway of blood circulation through the heart, and these are affected by heart disease.   Stress I: Signs and Symptoms -Discuss the causes of stress, how stress may lead to anxiety and depression, and ways to limit stress.   Stress II: Relaxation -Discuss different types of relaxation techniques to limit stress.   Warning Signs of Stroke / TIA -Discuss definition of a stroke, what the signs and symptoms are of a stroke, and how to identify when someone is having stroke.   Knowledge Questionnaire Score:  Knowledge Questionnaire Score - 05/01/21 0950       Knowledge Questionnaire Score   Pre Score 20/24             Core  Components/Risk Factors/Patient Goals at Admission:  Personal Goals and Risk Factors at Admission - 05/01/21 0950       Core Components/Risk Factors/Patient Goals on Admission    Weight Management Weight Maintenance    Heart Failure Yes    Intervention Provide a combined exercise and nutrition program that is supplemented with education, support and counseling about heart failure. Directed toward relieving symptoms such as shortness of breath, decreased exercise tolerance, and extremity edema.    Expected Outcomes Short term: Daily weights obtained and reported for increase. Utilizing diuretic protocols set by physician.    Hypertension Yes    Intervention Provide education on lifestyle modifcations including regular physical activity/exercise, weight management, moderate sodium restriction and increased consumption of fresh fruit, vegetables, and low fat dairy, alcohol moderation, and smoking cessation.    Expected Outcomes Short Term: Continued assessment and intervention until BP is < 140/11mm HG in hypertensive participants. < 130/60mm HG in hypertensive participants with diabetes, heart failure or chronic kidney disease.;Long Term: Maintenance of blood pressure at goal levels.    Personal Goal Other Yes    Personal Goal Patient wants to improve his strength and stamina and be able to work in his yard and garden this Spring without having to stop and rest. He would also like to play golf again.    Intervention Patient will attend CR 3 days/week with exercise and education and supplement with exercise at home.    Expected Outcomes Patient will complete the program meeting both program and personal goals.             Core Components/Risk Factors/Patient Goals Review:    Core  Components/Risk Factors/Patient Goals at Discharge (Final Review):    ITP Comments:   Comments: Patient arrived for 1st visit/orientation/education at 0800. Patient was referred to CR by Dr. Roxan Hockey due to  s/p CABGx2 (Z95.1) and AV Replacement (Z95.2). During orientation advised patient on arrival and appointment times what to wear, what to do before, during and after exercise. Reviewed attendance and class policy.  Pt is scheduled to return Cardiac Rehab on 05/07/21  at 1445. Pt was advised to come to class 15 minutes before class starts.  Discussed RPE/Dpysnea scales. Patient participated in warm up stretches. Patient was able to complete 6 minute walk test.  Telemetry:NSR. Patient was measured for the equipment. Discussed equipment safety with patient. Took patient pre-anthropometric measurements. Patient finished visit at 1015.

## 2021-05-07 ENCOUNTER — Encounter (HOSPITAL_COMMUNITY)
Admission: RE | Admit: 2021-05-07 | Discharge: 2021-05-07 | Disposition: A | Discharge status: Home / Self Care | Payer: Medicare HMO | Source: Ambulatory Visit | Attending: Cardiology | Admitting: Cardiology

## 2021-05-07 VITALS — Wt 161.6 lb

## 2021-05-07 DIAGNOSIS — Z952 Presence of prosthetic heart valve: Secondary | ICD-10-CM

## 2021-05-07 DIAGNOSIS — Z951 Presence of aortocoronary bypass graft: Secondary | ICD-10-CM

## 2021-05-07 NOTE — Progress Notes (Signed)
Daily Session Note ° °Patient Details  °Name: Keith Wagner °MRN: [id number] °Date of Birth: 06/16/1940 °Referring Provider:   °Flowsheet Row CARDIAC REHAB PHASE II ORIENTATION from 05/01/2021 in [place] [facility]  °Referring Provider Dr. Hendrickson  ° °  ° ° °Encounter Date: 05/07/2021 ° °Check In: ° Session Check In - 05/07/21 1430   ° °  ° Check-In  ° Supervising physician immediately available to respond to emergencies [facility] MD immediately available   ° Physician(s) Dr Branch   ° Location [facility]   ° Staff Present Debra Johnson, RN, BSN;Danny Gleber, RN, BSN;Dalton Fletcher, MS, ACSM-CEP, Exercise Physiologist;Heather Jachimiak, BS, Exercise Physiologist   ° Virtual Visit No   ° Medication changes reported     No   ° Fall or balance concerns reported    No   ° Tobacco Cessation No Change   ° Warm-up and Cool-down Performed as group-led instruction   ° Resistance Training Performed No   ° VAD Patient? No   ° PAD/SET Patient? No   °  ° Pain Assessment  ° Currently in Pain? No/denies   ° Pain Score 0-No pain   ° Multiple Pain Sites No   ° °  °  ° °  ° ° °Capillary Blood Glucose: °No results found for this or any previous visit (from the past 24 hour(s)). ° ° ° °Social History  ° °Tobacco Use  °Smoking Status Former  ° Types: Cigars  °Smokeless Tobacco Never  ° ° °Goals Met:  °Independence with exercise equipment °Exercise tolerated well °Personal goals reviewed °No report of concerns or symptoms today ° °Goals Unmet:  °Not Applicable ° °Comments: checkout 1545 ° ° ° °Dr. Jonathan Branch is Medical Director for [facility] ° °

## 2021-05-09 ENCOUNTER — Other Ambulatory Visit: Payer: Self-pay

## 2021-05-09 ENCOUNTER — Encounter (HOSPITAL_COMMUNITY): Payer: Medicare HMO

## 2021-05-09 ENCOUNTER — Encounter (HOSPITAL_COMMUNITY)
Admission: RE | Admit: 2021-05-09 | Discharge: 2021-05-09 | Disposition: A | Discharge status: Home / Self Care | Payer: Medicare HMO | Source: Ambulatory Visit | Attending: Thoracic Surgery (Cardiothoracic Vascular Surgery) | Admitting: Thoracic Surgery (Cardiothoracic Vascular Surgery)

## 2021-05-09 DIAGNOSIS — Z951 Presence of aortocoronary bypass graft: Secondary | ICD-10-CM

## 2021-05-09 DIAGNOSIS — Z952 Presence of prosthetic heart valve: Secondary | ICD-10-CM

## 2021-05-09 LAB — GLUCOSE, CAPILLARY: Glucose-Capillary: 118 mg/dL — ABNORMAL HIGH (ref 70–99)

## 2021-05-09 NOTE — Progress Notes (Signed)
Daily Session Note  Patient Details  Name: Keith Wagner MRN: [id number] Date of Birth: 10-Jul-1940 Referring Provider:   Flowsheet Row CARDIAC REHAB PHASE II ORIENTATION from 05/01/2021 in [place]  Referring Provider Dr. Roxan Hockey       Encounter Date: 05/09/2021  Check In:  Session Check In - 05/09/21 1445       Check-In   Supervising physician immediately available to respond to emergencies [facility] MD immediately available    Physician(s) Dr Harl Bowie    Location [facility]    Staff Present Geanie Cooley, RN;Heather Otho Ket, BS, Exercise Physiologist;Debra Wynetta Emery, RN, BSN    Virtual Visit No    Medication changes reported     No    Fall or balance concerns reported    No    Tobacco Cessation No Change    Warm-up and Cool-down Performed as group-led instruction    Resistance Training Performed Yes    VAD Patient? No    PAD/SET Patient? No      Pain Assessment   Currently in Pain? No/denies    Pain Score 0-No pain    Multiple Pain Sites No             Capillary Blood Glucose: No results found for this or any previous visit (from the past 24 hour(s)).    Social History   Tobacco Use  Smoking Status Former   Types: Cigars  Smokeless Tobacco Never    Goals Met:  Independence with exercise equipment Exercise tolerated well No report of concerns or symptoms today Strength training completed today  Goals Unmet:  Not Applicable  Comments: checkout @ 3:45pm   Dr. Carlyle Dolly is Medical Director for [facility]

## 2021-05-10 ENCOUNTER — Encounter (HOSPITAL_COMMUNITY): Payer: Medicare HMO

## 2021-05-11 ENCOUNTER — Encounter (HOSPITAL_COMMUNITY): Payer: Medicare HMO

## 2021-05-11 ENCOUNTER — Encounter (HOSPITAL_COMMUNITY)
Admission: RE | Admit: 2021-05-11 | Discharge: 2021-05-11 | Disposition: A | Discharge status: Home / Self Care | Payer: Medicare HMO | Source: Ambulatory Visit | Attending: Thoracic Surgery (Cardiothoracic Vascular Surgery) | Admitting: Thoracic Surgery (Cardiothoracic Vascular Surgery)

## 2021-05-11 DIAGNOSIS — Z951 Presence of aortocoronary bypass graft: Secondary | ICD-10-CM

## 2021-05-11 DIAGNOSIS — Z952 Presence of prosthetic heart valve: Secondary | ICD-10-CM

## 2021-05-11 LAB — GLUCOSE, CAPILLARY: Glucose-Capillary: 152 mg/dL — ABNORMAL HIGH (ref 70–99)

## 2021-05-11 NOTE — Progress Notes (Signed)
Daily Session Note  Patient Details  Name: Keith Wagner MRN: [id number] Date of Birth: 1940-10-13 Referring Provider:   Flowsheet Row CARDIAC REHAB PHASE II ORIENTATION from 05/01/2021 in [place]  Referring Provider Dr. Roxan Hockey       Encounter Date: 05/11/2021  Check In:  Session Check In - 05/11/21 1442       Check-In   Supervising physician immediately available to respond to emergencies [facility] MD immediately available    Physician(s) Dr. Harrington Challenger    Location [facility]    Staff Present Geanie Cooley, RN;Dalton Kris Mouton, MS, ACSM-CEP, Exercise Physiologist;Heather Zigmund Daniel, Exercise Physiologist    Virtual Visit No    Medication changes reported     No    Fall or balance concerns reported    No    Tobacco Cessation No Change    Warm-up and Cool-down Performed as group-led instruction    Resistance Training Performed Yes    VAD Patient? No    PAD/SET Patient? No      Pain Assessment   Currently in Pain? No/denies    Pain Score 0-No pain             Capillary Blood Glucose: Results for orders placed or performed during the hospital encounter of 05/11/21 (from the past 24 hour(s))  Glucose, capillary     Status: Abnormal   Collection Time: 05/11/21  2:36 PM  Result Value Ref Range   Glucose-Capillary 152 (H) 70 - 99 mg/dL      Social History   Tobacco Use  Smoking Status Former   Types: Cigars  Smokeless Tobacco Never    Goals Met:  Independence with exercise equipment Exercise tolerated well No report of concerns or symptoms today Strength training completed today  Goals Unmet:  Not Applicable  Comments: check out @ 3:45pm   Dr. Carlyle Dolly is Medical Director for [place]

## 2021-05-12 ENCOUNTER — Other Ambulatory Visit: Payer: Self-pay | Admitting: Cardiology

## 2021-05-14 ENCOUNTER — Encounter (HOSPITAL_COMMUNITY)
Admission: RE | Admit: 2021-05-14 | Discharge: 2021-05-14 | Disposition: A | Discharge status: Home / Self Care | Payer: Medicare HMO | Source: Ambulatory Visit | Attending: Thoracic Surgery (Cardiothoracic Vascular Surgery) | Admitting: Thoracic Surgery (Cardiothoracic Vascular Surgery)

## 2021-05-14 ENCOUNTER — Encounter (HOSPITAL_COMMUNITY): Payer: Medicare HMO

## 2021-05-14 DIAGNOSIS — Z951 Presence of aortocoronary bypass graft: Secondary | ICD-10-CM

## 2021-05-14 DIAGNOSIS — Z952 Presence of prosthetic heart valve: Secondary | ICD-10-CM

## 2021-05-14 NOTE — Progress Notes (Signed)
Daily Session Note  Patient Details  Name: Keith Wagner MRN: [id number] Date of Birth: 1940/05/24 Referring Provider:   Flowsheet Row CARDIAC REHAB PHASE II ORIENTATION from 05/01/2021 in [place]  Referring Provider Dr. Roxan Hockey       Encounter Date: 05/14/2021  Check In:  Session Check In - 05/14/21 1441       Check-In   Supervising physician immediately available to respond to emergencies [facility] MD immediately available    Physician(s) Dr. Harl Bowie    Location [facility]    Staff Present Geanie Cooley, RN;Dalton Kris Mouton, MS, ACSM-CEP, Exercise Physiologist;Heather Zigmund Daniel, Exercise Physiologist    Virtual Visit No    Medication changes reported     No    Fall or balance concerns reported    No    Tobacco Cessation No Change    Warm-up and Cool-down Performed as group-led instruction    Resistance Training Performed Yes    VAD Patient? No    PAD/SET Patient? No      Pain Assessment   Currently in Pain? No/denies    Pain Score 0-No pain    Multiple Pain Sites No             Capillary Blood Glucose: No results found for this or any previous visit (from the past 24 hour(s)).    Social History   Tobacco Use  Smoking Status Former   Types: Cigars  Smokeless Tobacco Never    Goals Met:  Independence with exercise equipment Exercise tolerated well No report of concerns or symptoms today Strength training completed today  Goals Unmet:  Not Applicable  Comments: check out @ 3:45pm   Dr. Carlyle Dolly is Medical Director for [place]

## 2021-05-16 ENCOUNTER — Encounter (HOSPITAL_COMMUNITY)
Admission: RE | Admit: 2021-05-16 | Discharge: 2021-05-16 | Disposition: A | Discharge status: Home / Self Care | Payer: Medicare HMO | Source: Ambulatory Visit | Attending: Thoracic Surgery (Cardiothoracic Vascular Surgery) | Admitting: Thoracic Surgery (Cardiothoracic Vascular Surgery)

## 2021-05-16 ENCOUNTER — Encounter (HOSPITAL_COMMUNITY): Payer: Medicare HMO

## 2021-05-16 DIAGNOSIS — Z951 Presence of aortocoronary bypass graft: Secondary | ICD-10-CM

## 2021-05-16 DIAGNOSIS — Z952 Presence of prosthetic heart valve: Secondary | ICD-10-CM

## 2021-05-16 NOTE — Progress Notes (Signed)
Daily Session Note  Patient Details  Name: Keith Wagner MRN: [id number] Date of Birth: 1940/11/17 Referring Provider:   Flowsheet Row CARDIAC REHAB PHASE II ORIENTATION from 05/01/2021 in [place]  Referring Provider Dr. Roxan Hockey       Encounter Date: 05/16/2021  Check In:  Session Check In - 05/16/21 1442       Check-In   Supervising physician immediately available to respond to emergencies [facility] MD immediately available    Physician(s) Dr. Domenic Polite    Location [facility]    Staff Present Geanie Cooley, RN;Heather Otho Ket, BS, Exercise Physiologist    Virtual Visit No    Medication changes reported     No    Fall or balance concerns reported    No    Tobacco Cessation No Change    Warm-up and Cool-down Performed as group-led instruction    Resistance Training Performed Yes    VAD Patient? No    PAD/SET Patient? No      Pain Assessment   Currently in Pain? No/denies    Pain Score 0-No pain    Multiple Pain Sites No             Capillary Blood Glucose: No results found for this or any previous visit (from the past 24 hour(s)).    Social History   Tobacco Use  Smoking Status Former   Types: Cigars  Smokeless Tobacco Never    Goals Met:  Independence with exercise equipment Exercise tolerated well No report of concerns or symptoms today Strength training completed today  Goals Unmet:  Not Applicable  Comments: check out @ 3:45pm   Dr. Carlyle Dolly is Medical Director for [place]

## 2021-05-17 ENCOUNTER — Encounter: Payer: Self-pay | Admitting: Cardiology

## 2021-05-17 ENCOUNTER — Other Ambulatory Visit: Payer: Self-pay

## 2021-05-17 ENCOUNTER — Ambulatory Visit: Payer: Medicare HMO | Admitting: Cardiology

## 2021-05-17 VITALS — BP 147/88 | HR 60 | Temp 97.6°F | Resp 16 | Ht 66.0 in | Wt 167.8 lb

## 2021-05-17 DIAGNOSIS — Z7901 Long term (current) use of anticoagulants: Secondary | ICD-10-CM

## 2021-05-17 DIAGNOSIS — I2510 Atherosclerotic heart disease of native coronary artery without angina pectoris: Secondary | ICD-10-CM

## 2021-05-17 DIAGNOSIS — I10 Essential (primary) hypertension: Secondary | ICD-10-CM

## 2021-05-17 DIAGNOSIS — Z87891 Personal history of nicotine dependence: Secondary | ICD-10-CM

## 2021-05-17 DIAGNOSIS — Z952 Presence of prosthetic heart valve: Secondary | ICD-10-CM

## 2021-05-17 DIAGNOSIS — E782 Mixed hyperlipidemia: Secondary | ICD-10-CM

## 2021-05-17 DIAGNOSIS — I480 Paroxysmal atrial fibrillation: Secondary | ICD-10-CM

## 2021-05-17 DIAGNOSIS — Z79899 Other long term (current) drug therapy: Secondary | ICD-10-CM

## 2021-05-17 DIAGNOSIS — I5042 Chronic combined systolic (congestive) and diastolic (congestive) heart failure: Secondary | ICD-10-CM

## 2021-05-17 MED ORDER — AMIODARONE HCL 200 MG PO TABS: 200.0000 mg | ORAL_TABLET | Freq: Every day | ORAL | Status: DC

## 2021-05-17 MED ORDER — ENTRESTO 97-103 MG PO TABS: 1.0000 | ORAL_TABLET | Freq: Two times a day (BID) | ORAL | 0 refills | Status: AC

## 2021-05-17 NOTE — Progress Notes (Signed)
ID:  Keith Wagner, DOB February 19, 1941, MRN [id number]  PCP:  Keith Wagner., PA-C  Cardiologist:  Keith Kras, DO, [facility] (established care 06/10/2019) Cardiothoracic surgeon: Dr. Roxan Wagner Former Cardiology Providers: Keith Lager, APRN, FNP-C  Date: 05/17/21 Last Office Visit: 03/15/2021  Chief Complaint  Patient presents with   Atrial Fibrillation   Hypertension   Follow-up    HPI  Keith Wagner is a 81 y.o. 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  Patient is accompanied by his wife Keith Wagner) at today's visit.  Patient is being followed for management of aortic stenosis.  Presented in September 2022 sooner than his scheduled appointment due to heart failure echocardiogram noted reduced LVEF, grade 2 diastolic impairment, left atrial pressures being elevated and aortic stenosis at least moderate per dimensional index.  Patient underwent angiography and was found to have CAD and referred to cardiothoracic surgery.  He was hospitalized between March 01, 2021 through December 19th 2022 during which he underwent two-vessel CABG (LIMA to LAD and SVG to OM with bioprosthetic aortic valve replacement with 21 mm Edwards Inspiris Resilia bioprosthetic valve.  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  Post bypass surgery/valve replacement he went into atrial fibrillation and since then has been on oral anticoagulation.  He now presents for follow-up.  Since last office visit his losartan has been discontinued and uptitrated to [facility].  He is currently taking amiodarone 200 mg p.o. daily.  No longer taking hydralazine.  Continues to do cardiac rehab 3 times a week.  He is slowly getting back to baseline.  He denies precordial discomfort or heart failure symptoms.   ALLERGIES: No Known Allergies  MEDICATION LIST PRIOR TO VISIT: Current Meds  Medication Sig   acetaminophen (TYLENOL) 500 MG tablet Take 1,000 mg by mouth every 6 (six) hours as needed for moderate pain.   apixaban (ELIQUIS) 5 MG TABS tablet Take 1 tablet (5 mg total) by mouth 2 (two) times daily.   aspirin 81 MG tablet Take 81 mg by mouth daily.   atorvastatin (LIPITOR) 40 MG tablet Take 1 tablet (40 mg total) by mouth daily.   calcium carbonate (TUMS - DOSED IN MG ELEMENTAL CALCIUM) 500 MG chewable tablet Chew 500-1,000 mg by mouth daily as needed for indigestion or heartburn.   clobetasol cream (TEMOVATE) 2.53 % Apply 1 application topically 2 (two) times daily as needed (eczema).   dapagliflozin propanediol (FARXIGA) 10 MG TABS tablet Take by mouth daily.   feeding supplement (ENSURE ENLIVE / ENSURE PLUS) LIQD Take 237 mLs by mouth 2 (two) times daily between meals.   furosemide (LASIX) 20 MG tablet Take 1 tablet (20 mg total) by mouth every morning.   Lutein 6 MG TABS Take 6 mg by mouth daily.   magnesium oxide (MAG-OX) 400 MG tablet Take 1 tablet (400 mg total) by mouth daily.   metoprolol succinate (TOPROL-XL) 25 MG  24 hr tablet TAKE 1 TABLET BY MOUTH ONCE DAILY. HOLD IF TOP BLOOD PRESSURE NUMBER IS LESS THAN 100 MMHG OR PILSE LES THAN 60 BPM (Patient taking differently: Take 25 mg by mouth daily. HOLD IF TOP BLOOD PRESSURE NUMBER IS LESS THAN 100 MMHG OR PILSE LES THAN 60 BPM)   Multiple  Vitamins-Minerals (MULTIVITAMIN PO) Take 1 tablet by mouth daily.   Omega-3 Fatty Acids (FISH OIL) 1000 MG CAPS Take 1,000 mg by mouth daily.   OVER THE COUNTER MEDICATION Take 1 tablet by mouth daily. Super Beta Prostate Advanced   potassium chloride SA (KLOR-CON M) 20 MEQ tablet Take 1 tablet (20 mEq total) by mouth daily.   sacubitril-valsartan (ENTRESTO) 97-103 MG Take 1 tablet by mouth 2 (two) times daily.   triamcinolone cream (KENALOG) 0.1 % Apply 1 application topically 2 (two) times daily as needed (eczema).   [DISCONTINUED] amiodarone (PACERONE) 200 MG tablet Take 1 tablet (200 mg total) by mouth 2 (two) times daily. (Patient taking differently: Take 200 mg by mouth daily.)   [DISCONTINUED] sacubitril-valsartan (ENTRESTO) 49-51 MG Take 1 tablet by mouth 2 (two) times daily.     PAST MEDICAL HISTORY: Past Medical History:  Diagnosis Date   Arthritis    Asthma    uses inhaler PRN   Bilateral carotid bruits 06/12/2018   Cataract    Diabetes mellitus without complication ([place])    per pt "pre-diabetic"- off Metformin- diet controlled    GERD (gastroesophageal reflux disease)    Gout    Heart murmur    Hypercholesteremia 06/12/2018   Hyperlipidemia    Hypertension    Mild aortic stenosis 06/12/2018    PAST SURGICAL HISTORY: Past Surgical History:  Procedure Laterality Date   AORTIC VALVE REPLACEMENT N/A 03/01/2021   Procedure: AORTIC VALVE REPLACEMENT (AVR) WITH INSPIRIS RESILIA  AORTIC VALVE SIZE 21MM;  Surgeon: [name], MD;  Location: [place];  Service: Open Heart Surgery;  Laterality: N/A;   CARDIAC CATHETERIZATION     CATARACT EXTRACTION Left    CHEST EXPLORATION  03/01/2021   Procedure: CHEST EXPLORATION;  Surgeon: [name], MD;  Location: [facility] OR;  Service: Open Heart Surgery;;   COLONOSCOPY     CORONARY ARTERY BYPASS GRAFT N/A 03/01/2021   Procedure: CORONARY ARTERY BYPASS GRAFTING (CABG) X 2 USING LEFT INTERNAL MAMMARY ARTERY AND RIGHT ENDOSCOPIC  GREATER SAPHENOUS VEIN CONDUITS;  Surgeon: [name], MD;  Location: [place];  Service: Open Heart Surgery;  Laterality: N/A;   ENDOVEIN HARVEST OF GREATER SAPHENOUS VEIN Right 03/01/2021   Procedure: ENDOVEIN HARVEST OF GREATER SAPHENOUS VEIN;  Surgeon: [name], MD;  Location: [place];  Service: Open Heart Surgery;  Laterality: Right;   EYE SURGERY     POLYPECTOMY     RIGHT HEART CATH AND CORONARY ANGIOGRAPHY N/A 02/06/2021   Procedure: RIGHT HEART CATH AND CORONARY ANGIOGRAPHY;  Surgeon: Nigel Mormon, MD;  Location: [place] CV LAB;  Service: Cardiovascular;  Laterality: N/A;   TEE WITHOUT CARDIOVERSION N/A 03/01/2021   Procedure: TRANSESOPHAGEAL ECHOCARDIOGRAM (TEE);  Surgeon: [name], MD;  Location: [place];  Service: Open Heart Surgery;  Laterality: N/A;   TEMPORARY PACEMAKER N/A 03/03/2021   Procedure: TEMPORARY PACEMAKER;  Surgeon: Adrian Prows, MD;  Location: [place] CV LAB;  Service: Cardiovascular;  Laterality: N/A;   THORACIC AORTOGRAM N/A 02/06/2021   Procedure: THORACIC AORTOGRAM;  Surgeon: Nigel Mormon, MD;  Location: [place] [facility];  Service: Cardiovascular;  Laterality:  N/A;    FAMILY HISTORY: No family history of premature coronary disease or sudden cardiac death.  SOCIAL HISTORY:  The patient  reports that he has quit smoking. His smoking use included cigars. He has never used smokeless tobacco. He reports that he does not drink alcohol and does not use drugs.  REVIEW OF SYSTEMS: Review of Systems  Cardiovascular:  Negative for chest pain, claudication, dyspnea on exertion, leg swelling, orthopnea, palpitations, paroxysmal nocturnal dyspnea and syncope.  Respiratory:  Negative for shortness of breath.    PHYSICAL EXAM: Vitals with BMI 05/17/2021 05/07/2021 05/01/2021  Height 5' 6" - 5' 6"  Weight 167 lbs 13 oz 161 lbs 10 oz 161 lbs 3 oz  BMI 27.1 80.99 83.38  Systolic 250 - 539  Diastolic 88 - 60  Pulse 60 - -    CONSTITUTIONAL: Appears older than stated age, hemodynamically stable, no acute distress.  SKIN: Skin is warm and dry. No rash noted. No cyanosis. No pallor. No jaundice HEAD: Normocephalic and atraumatic.  EYES: No scleral icterus MOUTH/THROAT: Moist oral membranes.  NECK: No JVD present. No thyromegaly noted. LYMPHATIC: No visible cervical adenopathy.  CHEST Normal respiratory effort. No intercostal retractions.  Sternotomy site is healing well. LUNGS: Clear to auscultation bilaterally.  No rales.  No stridor. No wheezes.  CARDIOVASCULAR: Regular, positive J6-B3, soft holosystolic murmur heard at the apex, no gallops or rubs. ABDOMINAL: Soft, nontender, nondistended, positive bowel sounds in all 4 quadrants no apparent ascites.  EXTREMITIES: Trace bilateral peripheral edema,  2+ DP and PT pulses. HEMATOLOGIC: No significant bruising NEUROLOGIC: Oriented to person, place, and time. Nonfocal. Normal muscle tone.  PSYCHIATRIC: Normal mood and affect. Normal behavior. Cooperative   CARDIAC DATABASE: CABG x2 on 03/01/2021: LIMA to LAD and SVG to OM and aortic valve replacement with a 21 mm Edwards InspirEase Resilia bioprosthetic valve.   Temporary Pacer Placement 03/03/2021.  EKG: 12/01/2020: Normal sinus rhythm, 94 bpm, left axis deviation, left anterior fascicular block, LVH, nonspecific T wave abnormality, PVCs, without underlying injury pattern.  03/03/2021: Episodes of sinus arrest with junctional escape, PACs.  Nonspecific T abnormality. 03/15/2021: Atrial fibrillation, 120 bpm, nonspecific IVCD, consider old anteroseptal infarct, ST-T changes in the high lateral and lateral leads suggestive of ischemia, without underlying injury pattern. 05/17/2021: NSR, 60 bpm, diffuse T wave abnormality, compared to prior EKG atrial fibrillation has transitioned to NSR.  Echocardiogram: 05/25/2019: LVEF 41-93%, grade 1 diastolic impairment  79/04/4095: LVEF 30-35%, moderate LVH, global HK, grade  2 DD, elevated LAP, mild LAE, moderate AAS, moderate AR, moderate MR, mild TR, mild PHTN.  03/03/2021:  1. Left ventricular ejection fraction, by estimation, is 50 to 55%. Left ventricular ejection fraction by PLAX is 45 %. The left ventricle has low normal function. The left ventricle has no regional wall motion normalities. There is mild left ventricular hypertrophy. Left ventricular diastolic parameters are consistent with Grade II diastolic dysfunction (pseudonormalization). Elevated left ventricular end-diastolic pressure.   2. Right ventricular systolic function is normal. The right ventricular size is normal.   3. Left atrial size was moderately dilated.   4. The mitral valve was not well visualized. Trivial mitral valve regurgitation.   5. The aortic valve was not well visualized. Aortic valve regurgitation is not visualized. Mild aortic valve stenosis. There is a 21 mm Edwards InspirEase Resilia valve present in the aortic position. Aortic valve area, by VTI measures 0.98 cm.  Aortic valve mean gradient measures 17.5 mmHg. Aortic valve Vmax measures 2.77 m/s.  6. The inferior vena cava is dilated in size with <50% respiratory variability, suggesting right atrial pressure of 15 mmHg.    Stress Testing: Treadmill Exercise stress 11/11/2014: Indication: Diabetes, HTN The patient exercised according to [facility] Protocol, Total time recorded  6:00 min achieving max heart rate of  148 which was  100% of MPHR for age and  7.05 METS of work. Occasional PVC during exercise.  Normal BP response. Resting ECG showing NSR. There was no ST-T changes of ischemia with exercise stress test. No exercise induced arrythmias noted. Stress terminated due to [facility] met.  Heart Catheterization: LHC / RHC / Thoracic Aortogram 02/06/2021: LM: Distal calcific 75% stenosis LAD: Mid 80% stenosis LCX: large vessel. Ostial 70% stenosis.  RCA: Dominant, but small vessel. No significant disease seen on well opacified  non-selective angiogram.   Normal PCW Unable to cross aortic valve due to severe aorta tortuosity.   CVTS consult for CABG +/- AVR  Carotid artery duplex 12/04/2017: Minimal stenosis in the right internal carotid artery (1-15%). Minimal stenosis in the left internal carotid artery (1-15%). Antegrade right vertebral artery flow. Antegrade left vertebral artery flow. Follow up when clinically indicated.  LABORATORY DATA: CBC Latest Ref Rng & Units 04/16/2021 03/09/2021 03/07/2021  WBC 4.0 - 10.5 K/uL - 10.2 9.5  Hemoglobin 13.0 - 17.7 g/dL 11.2(L) 8.7(L) 8.0(L)  Hematocrit 37.5 - 51.0 % 37.8 28.0(L) 25.2(L)  Platelets 150 - 400 K/uL - 359 252    CMP Latest Ref Rng & Units 04/26/2021 04/16/2021 03/27/2021  Glucose 70 - 99 mg/dL 111(H) 109(H) 105(H)  BUN 8 - 27 mg/dL 16 23 28(H)  Creatinine 0.76 - 1.27 mg/dL 1.31(H) 1.36(H) 1.40(H)  Sodium 134 - 144 mmol/L 140 144 138  Potassium 3.5 - 5.2 mmol/L 4.9 4.8 4.8  Chloride 96 - 106 mmol/L 103 99 101  CO2 20 - 29 mmol/L _0 Calcium 8.6 - 10.2 mg/dL 9.3 9.8 9.7  Total Protein 6.5 - 8.1 g/dL - - -  Total Bilirubin 0.3 - 1.2 mg/dL - - -  Alkaline Phos 38 - 126 U/L - - -  AST 15 - 41 U/L - - -  ALT 0 - 44 U/L - - -    Lipid Panel  No results found for: CHOL, TRIG, HDL, CHOLHDL, VLDL, LDLCALC, LDLDIRECT, LABVLDL  No components found for: NTPROBNP Recent Labs    12/08/20 0909 12/22/20 0833 01/01/21 0930 01/30/21 0920 03/27/21 0939 04/16/21 1024 04/26/21 1037  PROBNP 1,668* 1,673* 1,231* 412 922* 739* 484   Recent Labs    03/11/21 1246  TSH 8.253*    BMP Recent Labs    03/10/21 0113 03/11/21 0059 03/12/21 0410 03/27/21 0939 04/16/21 1024 04/26/21 1036  NA 135 137 135 138 144 140  K 3.9 4.1 4.2 4.8 4.8 4.9  CL 102 104 104 101 99 103  CO2 _1 GLUCOSE 109* 99 99 105* 109* 111*  BUN 29* 27* 26* 28* 23 16  CREATININE 1.52* 1.41* 1.41* 1.40* 1.36* 1.31*  CALCIUM 8.2* 8.4* 8.3* 9.7 9.8 9.3  GFRNONAA  46* 51* 51*  --   --   --     HEMOGLOBIN A1C Lab Results  Component Value Date   HGBA1C 6.3 (H) 02/27/2021   MPG 134.11 02/27/2021    11/29/2020 Sodium 142, potassium 4, chloride 105, bicarb 29, BUN 16, creatinine 1.23. Magnesium 2.4. BNP 1351  11/22/2020: Hemoglobin 11.8 g/dL, hematocrit 36.6%  IMPRESSION:    ICD-10-CM  1. Chronic combined systolic and diastolic congestive heart failure, NYHA class 2 (HCC)  I50.42 EKG 12-Lead    sacubitril-valsartan (ENTRESTO) 97-103 MG    Magnesium    Basic Metabolic Panel (BMET)    Pro b natriuretic peptide (BNP)[facility])    2. Coronary artery disease involving native coronary artery of native heart without angina pectoris  I25.10     3. S/P Bioprosthetic AVR 21 mm Edward InspirEase Resilia  Z95.2     4. Paroxysmal atrial fibrillation (HCC)  I48.0     5. Long term current use of antiarrhythmic drug  Z79.899 amiodarone (PACERONE) 200 MG tablet    6. Long term (current) use of anticoagulants  Z79.01     7. Benign essential hypertension  I10     8. Mixed hyperlipidemia  E78.2     9. Former smoker  Z87.891         RECOMMENDATIONS: IRVAN TIEDT is a 81 y.o. 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  Chronic combined systolic and diastolic congestive heart failure, NYHA class 2 (HCC) Euvolemic.   Stage C, NYHA class II Weight prior to discharge from December 2022 77.1 kg and today approximately 76.1 kg. Uptitrate Entresto from 49/51 mg p.o. twice daily to 97/103 mg p.o. twice daily. Medications reconciled. If room to uptitrate GDMT we will add spironolactone  next. Blood work in 1 week to reevaluate kidney function and electrolytes. Reemphasized the importance of using incentive spirometry, and compression stockings. Continue cardiac rehab.  Coronary artery disease involving native coronary artery of native heart without angina pectoris Denies angina pectoris. Medications reconciled. Sternotomy site is healing well. Continue cardiac rehab.  S/P Bioprosthetic AVR 21 mm Edward InspirEase Resilia Fingerprint echo 03/03/2021. Monitor for now  Paroxysmal atrial fibrillation [facility]) Discovered postop December 2022. Rate control: Toprol-XL. Rhythm control: Amiodarone. Thromboembolic prophylaxis: Eliquis Amiodarone has been reduced to 200 mg p.o. daily.  Long term current use of antiarrhythmic drug Amiodarone was started 03/03/2021. TSH 8.18 March 2021. AST/ALT within normal limits as of 02/27/2021. Patient is encouraged to have an annual eye exam, annual PFTs/chest x-ray. EKG notes normal sinus rhythm -as opposed to A-fib back in December 2022  Long term (current) use of anticoagulants Indication: Paroxysmal atrial fibrillation. Hemoglobin improved. He does not endorse evidence of bleeding. CHA2DS2-VASc SCORE is 5 which correlates to 7.2% risk of stroke per year (age, CAD, CHF, HTN).  Benign essential hypertension Blood pressure within acceptable range. We will increase Entresto to 97/103 mg p.o. twice daily.  FINAL MEDICATION LIST END OF ENCOUNTER: Meds ordered this encounter  Medications   sacubitril-valsartan (ENTRESTO) 97-103 MG    Sig: Take 1 tablet by mouth 2 (two) times daily.    Dispense:  180 tablet    Refill:  0   amiodarone (PACERONE) 200 MG tablet    Sig: Take 1 tablet (200 mg total) by mouth daily.     Medications Discontinued During This Encounter  Medication Reason   hydrALAZINE (APRESOLINE) 25 MG tablet Change in therapy   sacubitril-valsartan (ENTRESTO) 49-51 MG Dose change   amiodarone (PACERONE) 200 MG  tablet      Current Outpatient Medications:    acetaminophen (TYLENOL) 500 MG tablet,  Take 1,000 mg by mouth every 6 (six) hours as needed for moderate pain., Disp: , Rfl:    apixaban (ELIQUIS) 5 MG TABS tablet, Take 1 tablet (5 mg total) by mouth 2 (two) times daily., Disp: 60 tablet, Rfl: 3   aspirin 81 MG tablet, Take 81 mg by mouth daily., Disp: , Rfl:    atorvastatin (LIPITOR) 40 MG tablet, Take 1 tablet (40 mg total) by mouth daily., Disp: 30 tablet, Rfl: 3   calcium carbonate (TUMS - DOSED IN MG ELEMENTAL CALCIUM) 500 MG chewable tablet, Chew 500-1,000 mg by mouth daily as needed for indigestion or heartburn., Disp: , Rfl:    clobetasol cream (TEMOVATE) 9.70 %, Apply 1 application topically 2 (two) times daily as needed (eczema)., Disp: , Rfl:    dapagliflozin propanediol (FARXIGA) 10 MG TABS tablet, Take by mouth daily., Disp: , Rfl:    feeding supplement (ENSURE ENLIVE / ENSURE PLUS) LIQD, Take 237 mLs by mouth 2 (two) times daily between meals., Disp: 237 mL, Rfl: 12   furosemide (LASIX) 20 MG tablet, Take 1 tablet (20 mg total) by mouth every morning., Disp: 90 tablet, Rfl: 0   Lutein 6 MG TABS, Take 6 mg by mouth daily., Disp: , Rfl:    magnesium oxide (MAG-OX) 400 MG tablet, Take 1 tablet (400 mg total) by mouth daily., Disp: 30 tablet, Rfl: 11   metoprolol succinate (TOPROL-XL) 25 MG 24 hr tablet, TAKE 1 TABLET BY MOUTH ONCE DAILY. HOLD IF TOP BLOOD PRESSURE NUMBER IS LESS THAN 100 MMHG OR PILSE LES THAN 60 BPM (Patient taking differently: Take 25 mg by mouth daily. HOLD IF TOP BLOOD PRESSURE NUMBER IS LESS THAN 100 MMHG OR PILSE LES THAN 60 BPM), Disp: 30 tablet, Rfl: 3   Multiple Vitamins-Minerals (MULTIVITAMIN PO), Take 1 tablet by mouth daily., Disp: , Rfl:    Omega-3 Fatty Acids (FISH OIL) 1000 MG CAPS, Take 1,000 mg by mouth daily., Disp: , Rfl:    OVER THE COUNTER MEDICATION, Take 1 tablet by mouth daily. Super Beta Prostate Advanced, Disp: , Rfl:    potassium chloride SA  (KLOR-CON M) 20 MEQ tablet, Take 1 tablet (20 mEq total) by mouth daily., Disp: 7 tablet, Rfl: 0   sacubitril-valsartan (ENTRESTO) 97-103 MG, Take 1 tablet by mouth 2 (two) times daily., Disp: 180 tablet, Rfl: 0   triamcinolone cream (KENALOG) 0.1 %, Apply 1 application topically 2 (two) times daily as needed (eczema)., Disp: , Rfl:    amiodarone (PACERONE) 200 MG tablet, Take 1 tablet (200 mg total) by mouth daily., Disp: , Rfl:   Orders Placed This Encounter  Procedures   EKG 12-Lead    There are no Patient Instructions on file for this visit.   --Continue cardiac medications as reconciled in final medication list. --Return in about 3 months (around 08/14/2021) for Follow up, CAD, s/p 2v CABG, AVR. Or sooner if needed. --Continue follow-up with your primary care physician regarding the management of your other chronic comorbid conditions.  Patient's questions and concerns were addressed to his satisfaction. He voices understanding of the instructions provided during this encounter.   This note was created using a voice recognition software as a result there may be grammatical errors inadvertently enclosed that do not reflect the nature of this encounter. Every attempt is made to correct such errors.  Keith Wagner, [place], [facility]  Pager: [phone] Office: [phone]

## 2021-05-18 ENCOUNTER — Encounter (HOSPITAL_COMMUNITY): Payer: Medicare HMO

## 2021-05-18 ENCOUNTER — Encounter (HOSPITAL_COMMUNITY)
Admission: RE | Admit: 2021-05-18 | Discharge: 2021-05-18 | Disposition: A | Discharge status: Home / Self Care | Payer: Medicare HMO | Source: Ambulatory Visit | Attending: Thoracic Surgery (Cardiothoracic Vascular Surgery) | Admitting: Thoracic Surgery (Cardiothoracic Vascular Surgery)

## 2021-05-18 DIAGNOSIS — Z952 Presence of prosthetic heart valve: Secondary | ICD-10-CM

## 2021-05-18 DIAGNOSIS — Z951 Presence of aortocoronary bypass graft: Secondary | ICD-10-CM

## 2021-05-18 NOTE — Progress Notes (Signed)
Daily Session Note ° °Patient Details  °Name: Keith Wagner °MRN: [id number] °Date of Birth: 11/09/1940 °Referring Provider:   °Flowsheet Row CARDIAC REHAB PHASE II ORIENTATION from 05/01/2021 in [place] [facility]  °Referring Provider Dr. Hendrickson  ° °  ° ° °Encounter Date: 05/18/2021 ° °Check In: ° Session Check In - 05/18/21 1445   ° °  ° Check-In  ° Supervising physician immediately available to respond to emergencies [facility] MD immediately available   ° Physician(s) Dr. McDowell   ° Location [facility]   ° Staff Present Heather Jachimiak, BS, Exercise Physiologist;Other   ° Virtual Visit No   ° Medication changes reported     No   ° Fall or balance concerns reported    No   ° Tobacco Cessation No Change   ° Warm-up and Cool-down Performed as group-led instruction   ° Resistance Training Performed Yes   ° VAD Patient? No   ° PAD/SET Patient? No   °  ° Pain Assessment  ° Currently in Pain? No/denies   ° Pain Score 0-No pain   ° Multiple Pain Sites No   ° °  °  ° °  ° ° °Capillary Blood Glucose: °No results found for this or any previous visit (from the past 24 hour(s)). ° ° ° °Social History  ° °Tobacco Use  °Smoking Status Former  ° Types: Cigars  °Smokeless Tobacco Never  ° ° °Goals Met:  °Independence with exercise equipment °Exercise tolerated well °No report of concerns or symptoms today °Strength training completed today ° °Goals Unmet:  °Not Applicable ° °Comments: check out 1545 ° ° °Dr. Jonathan Branch is Medical Director for [facility] °

## 2021-05-21 ENCOUNTER — Encounter (HOSPITAL_COMMUNITY): Payer: Medicare HMO

## 2021-05-21 ENCOUNTER — Encounter (HOSPITAL_COMMUNITY)
Admission: RE | Admit: 2021-05-21 | Discharge: 2021-05-21 | Disposition: A | Discharge status: Home / Self Care | Payer: Medicare HMO | Source: Ambulatory Visit | Attending: Thoracic Surgery (Cardiothoracic Vascular Surgery) | Admitting: Thoracic Surgery (Cardiothoracic Vascular Surgery)

## 2021-05-21 VITALS — Wt 164.9 lb

## 2021-05-21 DIAGNOSIS — Z952 Presence of prosthetic heart valve: Secondary | ICD-10-CM

## 2021-05-21 DIAGNOSIS — Z951 Presence of aortocoronary bypass graft: Secondary | ICD-10-CM

## 2021-05-21 NOTE — Progress Notes (Signed)
Daily Session Note  Patient Details  Name: Keith Wagner MRN: [id number] Date of Birth: 11/19/1940 Referring Provider:   Flowsheet Row CARDIAC REHAB PHASE II ORIENTATION from 05/01/2021 in [place]  Referring Provider Dr. Roxan Hockey       Encounter Date: 05/21/2021  Check In:  Session Check In - 05/21/21 1443       Check-In   Supervising physician immediately available to respond to emergencies [facility] MD immediately available    Physician(s) Dr Harl Bowie    Location [facility]    Staff Present Jilda Roche, RN, Bjorn Loser, MS, ACSM-CEP, Exercise Physiologist;Heather Zigmund Graylen [place], Exercise Physiologist    Virtual Visit No    Medication changes reported     No    Fall or balance concerns reported    No    Tobacco Cessation No Change    Warm-up and Cool-down Performed as group-led instruction    Resistance Training Performed Yes    VAD Patient? No    PAD/SET Patient? No      Pain Assessment   Currently in Pain? No/denies    Pain Score 0-No pain             Capillary Blood Glucose: No results found for this or any previous visit (from the past 24 hour(s)).    Social History   Tobacco Use  Smoking Status Former   Types: Cigars  Smokeless Tobacco Never    Goals Met:  Independence with exercise equipment Exercise tolerated well No report of concerns or symptoms today Strength training completed today  Goals Unmet:  Not Applicable  Comments: checkout 1545   Dr. Carlyle Dolly is Medical Director for [place]

## 2021-05-23 ENCOUNTER — Encounter (HOSPITAL_COMMUNITY): Payer: Medicare HMO

## 2021-05-23 ENCOUNTER — Encounter (HOSPITAL_COMMUNITY)
Admission: RE | Admit: 2021-05-23 | Discharge: 2021-05-23 | Disposition: A | Discharge status: Home / Self Care | Payer: Medicare HMO | Source: Ambulatory Visit | Attending: Thoracic Surgery (Cardiothoracic Vascular Surgery) | Admitting: Thoracic Surgery (Cardiothoracic Vascular Surgery)

## 2021-05-23 DIAGNOSIS — Z951 Presence of aortocoronary bypass graft: Secondary | ICD-10-CM | POA: Diagnosis present

## 2021-05-23 DIAGNOSIS — Z952 Presence of prosthetic heart valve: Secondary | ICD-10-CM | POA: Insufficient documentation

## 2021-05-23 NOTE — Progress Notes (Signed)
Cardiac Individual Treatment Plan  Patient Details  Name: Keith Wagner MRN: [id number] Date of Birth: 10-04-1940 Referring Provider:   Flowsheet Row CARDIAC REHAB PHASE II ORIENTATION from 05/01/2021 in [facility]  Referring Provider Dr. Roxan Hockey       Initial Encounter Date:  Flowsheet Row CARDIAC REHAB PHASE II ORIENTATION from 05/01/2021 in [place]  Date 05/01/21       Visit Diagnosis: S/P AVR (aortic valve replacement)  S/P CABG x 2  Patient's Home Medications on Admission:  Current Outpatient Medications:    acetaminophen (TYLENOL) 500 MG tablet, Take 1,000 mg by mouth every 6 (six) hours as needed for moderate pain., Disp: , Rfl:    amiodarone (PACERONE) 200 MG tablet, Take 1 tablet (200 mg total) by mouth daily., Disp: , Rfl:    apixaban (ELIQUIS) 5 MG TABS tablet, Take 1 tablet (5 mg total) by mouth 2 (two) times daily., Disp: 60 tablet, Rfl: 3   aspirin 81 MG tablet, Take 81 mg by mouth daily., Disp: , Rfl:    atorvastatin (LIPITOR) 40 MG tablet, Take 1 tablet (40 mg total) by mouth daily., Disp: 30 tablet, Rfl: 3   calcium carbonate (TUMS - DOSED IN MG ELEMENTAL CALCIUM) 500 MG chewable tablet, Chew 500-1,000 mg by mouth daily as needed for indigestion or heartburn., Disp: , Rfl:    clobetasol cream (TEMOVATE) 0.93 %, Apply 1 application topically 2 (two) times daily as needed (eczema)., Disp: , Rfl:    dapagliflozin propanediol (FARXIGA) 10 MG TABS tablet, Take by mouth daily., Disp: , Rfl:    feeding supplement (ENSURE ENLIVE / ENSURE PLUS) LIQD, Take 237 mLs by mouth 2 (two) times daily between meals., Disp: 237 mL, Rfl: 12   furosemide (LASIX) 20 MG tablet, Take 1 tablet (20 mg total) by mouth every morning., Disp: 90 tablet, Rfl: 0   Lutein 6 MG TABS, Take 6 mg by mouth daily., Disp: , Rfl:    magnesium oxide (MAG-OX) 400 MG tablet, Take 1 tablet (400 mg total) by mouth daily., Disp: 30 tablet, Rfl: 11    metoprolol succinate (TOPROL-XL) 25 MG 24 hr tablet, TAKE 1 TABLET BY MOUTH ONCE DAILY. HOLD IF TOP BLOOD PRESSURE NUMBER IS LESS THAN 100 MMHG OR PILSE LES THAN 60 BPM (Patient taking differently: Take 25 mg by mouth daily. HOLD IF TOP BLOOD PRESSURE NUMBER IS LESS THAN 100 MMHG OR PILSE LES THAN 60 BPM), Disp: 30 tablet, Rfl: 3   Multiple Vitamins-Minerals (MULTIVITAMIN PO), Take 1 tablet by mouth daily., Disp: , Rfl:    Omega-3 Fatty Acids (FISH OIL) 1000 MG CAPS, Take 1,000 mg by mouth daily., Disp: , Rfl:    OVER THE COUNTER MEDICATION, Take 1 tablet by mouth daily. Super Beta Prostate Advanced, Disp: , Rfl:    potassium chloride SA (KLOR-CON M) 20 MEQ tablet, Take 1 tablet (20 mEq total) by mouth daily., Disp: 7 tablet, Rfl: 0   sacubitril-valsartan (ENTRESTO) 97-103 MG, Take 1 tablet by mouth 2 (two) times daily., Disp: 180 tablet, Rfl: 0   triamcinolone cream (KENALOG) 0.1 %, Apply 1 application topically 2 (two) times daily as needed (eczema)., Disp: , Rfl:   Past Medical History: Past Medical History:  Diagnosis Date   Arthritis    Asthma    uses inhaler PRN   Bilateral carotid bruits 06/12/2018   Cataract    Diabetes mellitus without complication ([place])    per pt "pre-diabetic"- off Metformin- diet controlled  GERD (gastroesophageal reflux disease)    Gout    Heart murmur    Hypercholesteremia 06/12/2018   Hyperlipidemia    Hypertension    Mild aortic stenosis 06/12/2018    Tobacco Use: Social History   Tobacco Use  Smoking Status Former   Types: Cigars  Smokeless Tobacco Never    Labs: Recent Review Flowsheet Data     Labs for ITP Cardiac and Pulmonary Rehab Latest Ref Rng & Units 03/03/2021 03/03/2021 03/03/2021 03/03/2021 03/04/2021   Hemoglobin A1c 4.8 - 5.6 % - - - - -   PHART 7.350 - 7.450 - 7.360 7.362 7.419 -   PCO2ART 32.0 - 48.0 mmHg - 28.7(L) 40.0 34.7 -   HCO3 20.0 - 28.0 mmol/L - 16.2(L) 22.7 22.6 -   TCO2 22 - 32 mmol/L - 17(L) 24 24 -   ACIDBASEDEF  0.0 - 2.0 mmol/L - 8.0(H) 3.0(H) 2.0 -   O2SAT % 52.3 78.0 59.0 97.0 62.9       Capillary Blood Glucose: Lab Results  Component Value Date   GLUCAP 152 (H) 05/11/2021   GLUCAP 118 (H) 05/09/2021   GLUCAP 106 (H) 05/01/2021   GLUCAP 128 (H) 03/11/2021   GLUCAP 108 (H) 03/11/2021     Exercise Target Goals: Exercise Program Goal: Individual exercise prescription set using results from initial 6 min walk test and THRR while considering  patients activity barriers and safety.   Exercise Prescription Goal: Starting with aerobic activity 30 plus minutes a day, 3 days per week for initial exercise prescription. Provide home exercise prescription and guidelines that participant acknowledges understanding prior to discharge.  Activity Barriers & Risk Stratification:  Activity Barriers & Cardiac Risk Stratification - 05/01/21 0825       Activity Barriers & Cardiac Risk Stratification   Activity Barriers None    Cardiac Risk Stratification High             6 Minute Walk:  6 Minute Walk     Row Name 05/01/21 1013         6 Minute Walk   Phase Initial     Distance 1000 feet     Walk Time 6 minutes     # of Rest Breaks 1     MPH 1.9     METS 1.84     RPE 12     VO2 Peak 6.42     Symptoms Yes (comment)     Comments one standing rest break 15 sec     Resting HR 62 bpm     Resting BP 116/60     Resting Oxygen Saturation  97 %     Exercise Oxygen Saturation  during 6 min walk 98 %     Max Ex. HR 90 bpm     Max Ex. BP 128/65     2 Minute Post BP 118/63              Oxygen Initial Assessment:   Oxygen Re-Evaluation:   Oxygen Discharge (Final Oxygen Re-Evaluation):   Initial Exercise Prescription:  Initial Exercise Prescription - 05/01/21 1000       Date of Initial Exercise RX and Referring Provider   Date 05/01/21    Referring Provider Dr. Roxan Hockey    Expected Discharge Date 07/20/21      Treadmill   MPH 1.4    Grade 0    Minutes 17       NuStep   Level 1    SPM 60  Minutes 22      Prescription Details   Frequency (times per week) 3    Duration Progress to 30 minutes of continuous aerobic without signs/symptoms of physical distress      Intensity   THRR 40-80% of Max Heartrate 56-113    Ratings of Perceived Exertion 11-13    Perceived Dyspnea 0-4      Resistance Training   Training Prescription Yes    Weight 3    Reps 10-15             Perform Capillary Blood Glucose checks as needed.  Exercise Prescription Changes:   Exercise Prescription Changes     Row Name 05/07/21 1543 05/21/21 1540           Response to Exercise   Blood Pressure (Admit) 110/58 120/70      Blood Pressure (Exercise) 140/82 128/62      Blood Pressure (Exit) 120/80 130/80      Heart Rate (Admit) 65 bpm 69 bpm      Heart Rate (Exercise) 106 bpm 109 bpm      Heart Rate (Exit) 83 bpm 77 bpm      Rating of Perceived Exertion (Exercise) 12 11      Duration Continue with 30 min of aerobic exercise without signs/symptoms of physical distress. Continue with 30 min of aerobic exercise without signs/symptoms of physical distress.      Intensity THRR unchanged THRR unchanged        Progression   Progression Continue to progress workloads to maintain intensity without signs/symptoms of physical distress. Continue to progress workloads to maintain intensity without signs/symptoms of physical distress.        Resistance Training   Training Prescription Yes Yes      Weight 3 3      Reps 10-15 10-15      Time 10 Minutes 10 Minutes        Treadmill   MPH 1.4 1.2      Grade 0 0      Minutes 17 17      METs 2.07 1.92        NuStep   Level 1 2      SPM 122 112      Minutes 22 22      METs 4.24 3.19               Exercise Comments:   Exercise Goals and Review:   Exercise Goals     Row Name 05/01/21 1017 05/21/21 1542           Exercise Goals   Increase Physical Activity Yes Yes      Intervention Provide advice,  education, support and counseling about physical activity/exercise needs.;Develop an individualized exercise prescription for aerobic and resistive training based on initial evaluation findings, risk stratification, comorbidities and participant's personal goals. Provide advice, education, support and counseling about physical activity/exercise needs.;Develop an individualized exercise prescription for aerobic and resistive training based on initial evaluation findings, risk stratification, comorbidities and participant's personal goals.      Expected Outcomes Short Term: Attend rehab on a regular basis to increase amount of physical activity.;Long Term: Add in home exercise to make exercise part of routine and to increase amount of physical activity.;Long Term: Exercising regularly at least 3-5 days a week. Short Term: Attend rehab on a regular basis to increase amount of physical activity.;Long Term: Add in home exercise to make exercise part of routine and to increase amount of  physical activity.;Long Term: Exercising regularly at least 3-5 days a week.      Increase Strength and Stamina Yes --      Intervention Provide advice, education, support and counseling about physical activity/exercise needs.;Develop an individualized exercise prescription for aerobic and resistive training based on initial evaluation findings, risk stratification, comorbidities and participant's personal goals. Provide advice, education, support and counseling about physical activity/exercise needs.;Develop an individualized exercise prescription for aerobic and resistive training based on initial evaluation findings, risk stratification, comorbidities and participant's personal goals.      Expected Outcomes Short Term: Increase workloads from initial exercise prescription for resistance, speed, and METs.;Short Term: Perform resistance training exercises routinely during rehab and add in resistance training at home;Long Term: Improve  cardiorespiratory fitness, muscular endurance and strength as measured by increased METs and functional capacity (6MWT) Short Term: Increase workloads from initial exercise prescription for resistance, speed, and METs.;Short Term: Perform resistance training exercises routinely during rehab and add in resistance training at home;Long Term: Improve cardiorespiratory fitness, muscular endurance and strength as measured by increased METs and functional capacity (6MWT)      Able to understand and use rate of perceived exertion (RPE) scale Yes Yes      Intervention Provide education and explanation on how to use RPE scale Provide education and explanation on how to use RPE scale      Expected Outcomes Short Term: Able to use RPE daily in rehab to express subjective intensity level;Long Term:  Able to use RPE to guide intensity level when exercising independently Short Term: Able to use RPE daily in rehab to express subjective intensity level;Long Term:  Able to use RPE to guide intensity level when exercising independently      Knowledge and understanding of Target Heart Rate Range (THRR) Yes Yes      Intervention Provide education and explanation of THRR including how the numbers were predicted and where they are located for reference Provide education and explanation of THRR including how the numbers were predicted and where they are located for reference      Expected Outcomes Short Term: Able to state/look up THRR;Long Term: Able to use THRR to govern intensity when exercising independently;Short Term: Able to use daily as guideline for intensity in rehab Short Term: Able to state/look up THRR;Long Term: Able to use THRR to govern intensity when exercising independently;Short Term: Able to use daily as guideline for intensity in rehab      Able to check pulse independently Yes Yes      Intervention Provide education and demonstration on how to check pulse in carotid and radial arteries.;Review the importance  of being able to check your own pulse for safety during independent exercise Provide education and demonstration on how to check pulse in carotid and radial arteries.;Review the importance of being able to check your own pulse for safety during independent exercise      Expected Outcomes Short Term: Able to explain why pulse checking is important during independent exercise;Long Term: Able to check pulse independently and accurately Short Term: Able to explain why pulse checking is important during independent exercise;Long Term: Able to check pulse independently and accurately      Understanding of Exercise Prescription Yes Yes      Intervention Provide education, explanation, and written materials on patient's individual exercise prescription Provide education, explanation, and written materials on patient's individual exercise prescription      Expected Outcomes Short Term: Able to explain program exercise prescription;Long Term: Able  to explain home exercise prescription to exercise independently Short Term: Able to explain program exercise prescription;Long Term: Able to explain home exercise prescription to exercise independently               Exercise Goals Re-Evaluation :  Exercise Goals Re-Evaluation     Row Name 05/21/21 1542             Exercise Goal Re-Evaluation   Exercise Goals Review Increase Physical Activity;Increase Strength and Stamina;Able to understand and use rate of perceived exertion (RPE) scale;Knowledge and understanding of Target Heart Rate Range (THRR);Able to check pulse independently;Understanding of Exercise Prescription       Comments Pt has completed 8 seesions of cardiac rehab. He is motivated when he is in class. He is increasing his workload when he can. He is currently exercising at 3.19 METs in the stepper. Will continue to monitor and progress as able.       Expected Outcomes Through exercise at rehab and at home, the patient will meet their stated  goals.                 Discharge Exercise Prescription (Final Exercise Prescription Changes):  Exercise Prescription Changes - 05/21/21 1540       Response to Exercise   Blood Pressure (Admit) 120/70    Blood Pressure (Exercise) 128/62    Blood Pressure (Exit) 130/80    Heart Rate (Admit) 69 bpm    Heart Rate (Exercise) 109 bpm    Heart Rate (Exit) 77 bpm    Rating of Perceived Exertion (Exercise) 11    Duration Continue with 30 min of aerobic exercise without signs/symptoms of physical distress.    Intensity THRR unchanged      Progression   Progression Continue to progress workloads to maintain intensity without signs/symptoms of physical distress.      Resistance Training   Training Prescription Yes    Weight 3    Reps 10-15    Time 10 Minutes      Treadmill   MPH 1.2    Grade 0    Minutes 17    METs 1.92      NuStep   Level 2    SPM 112    Minutes 22    METs 3.19             Nutrition:  Target Goals: Understanding of nutrition guidelines, daily intake of sodium 1500mg , cholesterol 200mg , calories 30% from fat and 7% or less from saturated fats, daily to have 5 or more servings of fruits and vegetables.  Biometrics:  Pre Biometrics - 05/01/21 1018       Pre Biometrics   Height 5\' 6"  (1.676 m)    Weight 73.1 kg    Waist Circumference 40.5 inches    Hip Circumference 38 inches    Waist to Hip Ratio 1.07 %    BMI (Calculated) 26.02    Triceps Skinfold 22 mm    % Body Fat 29.4 %    Grip Strength 28.6 kg    Flexibility 0 in    Single Leg Stand 5 seconds              Nutrition Therapy Plan and Nutrition Goals:  Nutrition Therapy & Goals - 05/01/21 0947       Personal Nutrition Goals   Comments Patient scored 9 on his diet assessment. Scores discussed with he and his wife. Handout provided and explained regarding heart healthy nutrition and  DM information  since he is taking [place] and has a history of DM. He and his wife verbalized  understanding. We offer 2 educational sessions regarding heart healthy nutrition with handouts and assistance with RD referral. His wife has patient following a low sodium, low fat, low carb diet.             Nutrition Assessments:  Nutrition Assessments - 05/01/21 0946       MEDFICTS Scores   Pre Score 9            MEDIFICTS Score Key: ?70 Need to make dietary changes  40-70 Heart Healthy Diet ? 40 Therapeutic Level Cholesterol Diet   Picture Your Plate Scores: <43 Unhealthy dietary pattern with much room for improvement. 41-50 Dietary pattern unlikely to meet recommendations for good health and room for improvement. 51-60 More healthful dietary pattern, with some room for improvement.  >60 Healthy dietary pattern, although there may be some specific behaviors that could be improved.    Nutrition Goals Re-Evaluation:   Nutrition Goals Discharge (Final Nutrition Goals Re-Evaluation):   Psychosocial: Target Goals: Acknowledge presence or absence of significant depression and/or stress, maximize coping skills, provide positive support system. Participant is able to verbalize types and ability to use techniques and skills needed for reducing stress and depression.  Initial Review & Psychosocial Screening:  Initial Psych Review & Screening - 05/01/21 0953       Initial Review   Current issues with None Identified      Family Dynamics   Good Support System? Yes      Barriers   Psychosocial barriers to participate in program The patient should benefit from training in stress management and relaxation.;There are no identifiable barriers or psychosocial needs.      Screening Interventions   Interventions Encouraged to exercise             Quality of Life Scores:  Quality of Life - 05/01/21 1019       Quality of Life   Select Quality of Life      Quality of Life Scores   Health/Function Pre 30 %    Socioeconomic Pre 30 %    Psych/Spiritual Pre 30 %     Family Pre 30 %    GLOBAL Pre 30 %            Scores of 19 and below usually indicate a poorer quality of life in these areas.  A difference of  2-3 points is a clinically meaningful difference.  A difference of 2-3 points in the total score of the Quality of Life Index has been associated with significant improvement in overall quality of life, self-image, physical symptoms, and general health in studies assessing change in quality of life.  PHQ-9: Recent Review Flowsheet Data     Depression screen [facility] 2/9 05/01/2021   Decreased Interest 0   Down, Depressed, Hopeless 0   PHQ - 2 Score 0   Altered sleeping 1   Tired, decreased energy 0   Change in appetite 0   Feeling bad or failure about yourself  0   Trouble concentrating 0   Moving slowly or fidgety/restless 0   Suicidal thoughts 0   PHQ-9 Score 1   Difficult doing work/chores Not difficult at all      Interpretation of Total Score  Total Score Depression Severity:  1-4 = Minimal depression, 5-9 = Mild depression, 10-14 = Moderate depression, 15-19 = Moderately severe depression, 20-27 = Severe depression   Psychosocial  Evaluation and Intervention:  Psychosocial Evaluation - 05/01/21 0954       Psychosocial Evaluation & Interventions   Interventions Relaxation education;Stress management education    Comments Patient hs no psychosocial barriers or issues identified at his orientation visit. His initial PHQ-9 score was 1 due to not being able to stay asleep. He lives with his wife of 28 years who was present with him during his visit. He names her as his support as well as 2 brothers and their wives. He denies any depression, anxiety or stress and has been in good health until his aortic valve stenosis. He was very active prior to his surgery playing golf and doing his own yard work and having a relatively large vegetable garden every year. He is ready to start the program hoping to be able to do these things again in the  Spring.    Expected Outcomes Patient wil continue to have no psychosocial barriers identified.    Continue Psychosocial Services  No Follow up required             Psychosocial Re-Evaluation:  Psychosocial Re-Evaluation     [place] Name 05/16/21 [phone]             Psychosocial Re-Evaluation   Current issues with None Identified       Comments Patient is new to the program completing 3 sessions. He continues to have no psychosocial barriers or issues identified. He seems to enjoy coming to the sessions and demonstrates an interest in improving his health. We will continue to monitor.       Expected Outcomes Patient will continue to have no psychosocial barriers identified.       Interventions Stress management education;Encouraged to attend Cardiac Rehabilitation for the exercise;Relaxation education       Continue Psychosocial Services  No Follow up required                Psychosocial Discharge (Final Psychosocial Re-Evaluation):  Psychosocial Re-Evaluation - 05/16/21 0732       Psychosocial Re-Evaluation   Current issues with None Identified    Comments Patient is new to the program completing 3 sessions. He continues to have no psychosocial barriers or issues identified. He seems to enjoy coming to the sessions and demonstrates an interest in improving his health. We will continue to monitor.    Expected Outcomes Patient will continue to have no psychosocial barriers identified.    Interventions Stress management education;Encouraged to attend Cardiac Rehabilitation for the exercise;Relaxation education    Continue Psychosocial Services  No Follow up required             Vocational Rehabilitation: Provide vocational rehab assistance to qualifying candidates.   Vocational Rehab Evaluation & Intervention:  Vocational Rehab - 05/01/21 0950       Initial Vocational Rehab Evaluation & Intervention   Assessment shows need for Vocational Rehabilitation No       Vocational Rehab Re-Evaulation   Comments Patient is retired and does not need vocational rehab.             Education: Education Goals: Education classes will be provided on a weekly basis, covering required topics. Participant will state understanding/return demonstration of topics presented.  Learning Barriers/Preferences:   Education Topics: Hypertension, Hypertension Reduction -Define heart disease and high blood pressure. Discus how high blood pressure affects the body and ways to reduce high blood pressure.   Exercise and Your Heart -Discuss why it is important to exercise, the FITT principles  of exercise, normal and abnormal responses to exercise, and how to exercise safely.   Angina -Discuss definition of angina, causes of angina, treatment of angina, and how to decrease risk of having angina.   Cardiac Medications -Review what the following cardiac medications are used for, how they affect the body, and side effects that may occur when taking the medications.  Medications include Aspirin, Beta blockers, calcium channel blockers, ACE Inhibitors, angiotensin receptor blockers, diuretics, digoxin, and antihyperlipidemics.   Congestive Heart Failure -Discuss the definition of CHF, how to live with CHF, the signs and symptoms of CHF, and how keep track of weight and sodium intake.   Heart Disease and Intimacy -Discus the effect sexual activity has on the heart, how changes occur during intimacy as we age, and safety during sexual activity.   Smoking Cessation / COPD -Discuss different methods to quit smoking, the health benefits of quitting smoking, and the definition of COPD. Flowsheet Row CARDIAC REHAB PHASE II EXERCISE from 05/16/2021 in [place]  Date 05/09/21  Educator pb  Instruction Review Code 1- Verbalizes Understanding       Nutrition I: Fats -Discuss the types of cholesterol, what cholesterol does to the heart, and how  cholesterol levels can be controlled. Flowsheet Row CARDIAC REHAB PHASE II EXERCISE from 05/16/2021 in [place]  Date 05/16/21  Educator pb  Instruction Review Code 1- Verbalizes Understanding       Nutrition II: Labels -Discuss the different components of food labels and how to read food label   Heart Parts/Heart Disease and PAD -Discuss the anatomy of the heart, the pathway of blood circulation through the heart, and these are affected by heart disease.   Stress I: Signs and Symptoms -Discuss the causes of stress, how stress may lead to anxiety and depression, and ways to limit stress.   Stress II: Relaxation -Discuss different types of relaxation techniques to limit stress.   Warning Signs of Stroke / TIA -Discuss definition of a stroke, what the signs and symptoms are of a stroke, and how to identify when someone is having stroke.   Knowledge Questionnaire Score:  Knowledge Questionnaire Score - 05/01/21 0950       Knowledge Questionnaire Score   Pre Score 20/24             Core Components/Risk Factors/Patient Goals at Admission:  Personal Goals and Risk Factors at Admission - 05/01/21 0950       Core Components/Risk Factors/Patient Goals on Admission    Weight Management Weight Maintenance    Heart Failure Yes    Intervention Provide a combined exercise and nutrition program that is supplemented with education, support and counseling about heart failure. Directed toward relieving symptoms such as shortness of breath, decreased exercise tolerance, and extremity edema.    Expected Outcomes Short term: Daily weights obtained and reported for increase. Utilizing diuretic protocols set by physician.    Hypertension Yes    Intervention Provide education on lifestyle modifcations including regular physical activity/exercise, weight management, moderate sodium restriction and increased consumption of fresh fruit, vegetables, and low fat dairy,  alcohol moderation, and smoking cessation.    Expected Outcomes Short Term: Continued assessment and intervention until BP is < 140/24mm HG in hypertensive participants. < 130/71mm HG in hypertensive participants with diabetes, heart failure or chronic kidney disease.;Long Term: Maintenance of blood pressure at goal levels.    Personal Goal Other Yes    Personal Goal Patient wants to improve his strength  and stamina and be able to work in his yard and garden this Spring without having to stop and rest. He would also like to play golf again.    Intervention Patient will attend CR 3 days/week with exercise and education and supplement with exercise at home.    Expected Outcomes Patient will complete the program meeting both program and personal goals.             Core Components/Risk Factors/Patient Goals Review:   Goals and Risk Factor Review     Row Name 05/16/21 0734             Core Components/Risk Factors/Patient Goals Review   Personal Goals Review Weight Management/Obesity;Hypertension;Heart Failure;Other       Review Patient was referred to CR with [facility]. He has completed 3 sessions with current weight at 163.9 up 3 lbs from his initial weight. He has multiple risk factors for CAD and is participating in the program for risk modification. His blood pressue is hypertensive. We will continue to monitor. His personal goals for the program are to improve his strength and stamina; be able to do his yardwork and gardening again and play golf again. We will continue to monitor his progress as he works towards meeting these goals.       Expected Outcomes Patient will complete the program meeting both personal and program goals.                Core Components/Risk Factors/Patient Goals at Discharge (Final Review):   Goals and Risk Factor Review - 05/16/21 0734       Core Components/Risk Factors/Patient Goals Review   Personal Goals Review Weight  Management/Obesity;Hypertension;Heart Failure;Other    Review Patient was referred to CR with [facility]. He has completed 3 sessions with current weight at 163.9 up 3 lbs from his initial weight. He has multiple risk factors for CAD and is participating in the program for risk modification. His blood pressue is hypertensive. We will continue to monitor. His personal goals for the program are to improve his strength and stamina; be able to do his yardwork and gardening again and play golf again. We will continue to monitor his progress as he works towards meeting these goals.    Expected Outcomes Patient will complete the program meeting both personal and program goals.             ITP Comments:   Comments: ITP REVIEW Pt is making expected progress toward Cardiac Rehab goals after completing 8 sessions. Recommend continued exercise, life style modification, education, and increased stamina and strength.

## 2021-05-23 NOTE — Progress Notes (Signed)
Daily Session Note ? ?Patient Details  ?Name: Keith Wagner ?MRN: [id number] ?Date of Birth: 02/02/1941 ?Referring Provider:   ?Flowsheet Row CARDIAC REHAB PHASE II ORIENTATION from 05/01/2021 in [facility]  ?Referring Provider Dr. Hendrickson  ? ?  ? ? ?Encounter Date: 05/23/2021 ? ?Check In: ? Session Check In - 05/23/21 1445   ? ?  ? Check-In  ? Supervising physician immediately available to respond to emergencies [facility] MD immediately available   ? Physician(s) Dr Branch   ? Location [facility]   ? Staff Present Phyllis Billingsley, RN;Dalton Fletcher, MS, ACSM-CEP, Exercise Physiologist;Heather Jachimiak, BS, Exercise Physiologist;Debra Johnson, RN, BSN   ? Virtual Visit No   ? Medication changes reported     No   ? Fall or balance concerns reported    No   ? Tobacco Cessation No Change   ? Warm-up and Cool-down Performed as group-led instruction   ? Resistance Training Performed Yes   ? VAD Patient? No   ? PAD/SET Patient? No   ?  ? Pain Assessment  ? Currently in Pain? No/denies   ? Pain Score 0-No pain   ? Multiple Pain Sites No   ? ?  ?  ? ?  ? ? ?Capillary Blood Glucose: ?No results found for this or any previous visit (from the past 24 hour(s)). ? ? ? ?Social History  ? ?Tobacco Use  ?Smoking Status Former  ? Types: Cigars  ?Smokeless Tobacco Never  ? ? ?Goals Met:  ?Independence with exercise equipment ?Exercise tolerated well ?No report of concerns or symptoms today ?Strength training completed today ? ?Goals Unmet:  ?Not Applicable ? ?Comments: check out @ 3:45pm ? ? ?Dr. Jonathan Branch is Medical Director for [facility] ?

## 2021-05-25 ENCOUNTER — Encounter (HOSPITAL_COMMUNITY): Payer: Medicare HMO

## 2021-05-25 ENCOUNTER — Encounter (HOSPITAL_COMMUNITY)
Admission: RE | Admit: 2021-05-25 | Discharge: 2021-05-25 | Disposition: A | Discharge status: Home / Self Care | Payer: Medicare HMO | Source: Ambulatory Visit | Attending: Thoracic Surgery (Cardiothoracic Vascular Surgery) | Admitting: Thoracic Surgery (Cardiothoracic Vascular Surgery)

## 2021-05-25 DIAGNOSIS — Z952 Presence of prosthetic heart valve: Secondary | ICD-10-CM

## 2021-05-25 DIAGNOSIS — Z951 Presence of aortocoronary bypass graft: Secondary | ICD-10-CM

## 2021-05-25 NOTE — Progress Notes (Signed)
Daily Session Note ? ?Patient Details  ?Name: Keith Wagner ?MRN: [id number] ?Date of Birth: November 16, 1940 ?Referring Provider:   ?Flowsheet Row CARDIAC REHAB PHASE II ORIENTATION from 05/01/2021 in [facility]  ?Referring Provider Dr. Roxan Hockey  ? ?  ? ? ?Encounter Date: 05/25/2021 ? ?Check In: ? Session Check In - 05/25/21 1438   ? ?  ? Check-In  ? Supervising physician immediately available to respond to emergencies [facility] MD immediately available   ? Physician(s) Dr Harl Bowie   ? Location [facility]   ? Staff Present Geanie Cooley, RN;Dalton Kris Mouton, MS, ACSM-CEP, Exercise Physiologist;Debra Wynetta Emery, RN, BSN   ? Virtual Visit No   ? Medication changes reported     No   ? Fall or balance concerns reported    No   ? Tobacco Cessation No Change   ? Warm-up and Cool-down Performed as group-led instruction   ? Resistance Training Performed Yes   ? VAD Patient? No   ? PAD/SET Patient? No   ?  ? Pain Assessment  ? Currently in Pain? No/denies   ? Pain Score 0-No pain   ? Multiple Pain Sites No   ? ?  ?  ? ?  ? ? ?Capillary Blood Glucose: ?No results found for this or any previous visit (from the past 24 hour(s)). ? ? ? ?Social History  ? ?Tobacco Use  ?Smoking Status Former  ? Types: Cigars  ?Smokeless Tobacco Never  ? ? ?Goals Met:  ?Independence with exercise equipment ?Exercise tolerated well ?No report of concerns or symptoms today ?Strength training completed today ? ?Goals Unmet:  ?Not Applicable ? ?Comments: check out @ 3:45pm ? ? ?Dr. Carlyle Dolly is Medical Director for [place] ?

## 2021-05-26 LAB — BASIC METABOLIC PANEL
BUN/Creatinine Ratio: 16 (ref 10–24)
BUN: 20 mg/dL (ref 8–27)
CO2: 26 mmol/L (ref 20–29)
Calcium: 9.4 mg/dL (ref 8.6–10.2)
Chloride: 100 mmol/L (ref 96–106)
Creatinine, Ser: 1.28 mg/dL — ABNORMAL HIGH (ref 0.76–1.27)
Glucose: 119 mg/dL — ABNORMAL HIGH (ref 70–99)
Potassium: 4.3 mmol/L (ref 3.5–5.2)
Sodium: 140 mmol/L (ref 134–144)
eGFR: 57 mL/min/{1.73_m2} — ABNORMAL LOW (ref >59)

## 2021-05-26 LAB — PRO B NATRIURETIC PEPTIDE: NT-Pro BNP: 403 pg/mL (ref 0–486)

## 2021-05-26 LAB — MAGNESIUM: Magnesium: 2.5 mg/dL — ABNORMAL HIGH (ref 1.6–2.3)

## 2021-05-28 ENCOUNTER — Encounter (HOSPITAL_COMMUNITY)
Admission: RE | Admit: 2021-05-28 | Discharge: 2021-05-28 | Disposition: A | Discharge status: Home / Self Care | Payer: Medicare HMO | Source: Ambulatory Visit | Attending: Thoracic Surgery (Cardiothoracic Vascular Surgery) | Admitting: Thoracic Surgery (Cardiothoracic Vascular Surgery)

## 2021-05-28 ENCOUNTER — Encounter (HOSPITAL_COMMUNITY): Payer: Medicare HMO

## 2021-05-28 DIAGNOSIS — Z952 Presence of prosthetic heart valve: Secondary | ICD-10-CM

## 2021-05-28 DIAGNOSIS — Z951 Presence of aortocoronary bypass graft: Secondary | ICD-10-CM

## 2021-05-28 NOTE — Progress Notes (Signed)
Daily Session Note ? ?Patient Details  ?Name: Keith Wagner ?MRN: [id number] ?Date of Birth: 11-Dec-1940 ?Referring Provider:   ?Flowsheet Row CARDIAC REHAB PHASE II ORIENTATION from 05/01/2021 in [place]  ?Referring Provider Dr. Roxan Hockey  ? ?  ? ? ?Encounter Date: 05/28/2021 ? ?Check In: ? Session Check In - 05/28/21 1441   ? ?  ? Check-In  ? Supervising physician immediately available to respond to emergencies [facility] MD immediately available   ? Physician(s) Dr Johnsie Cancel   ? Location [facility]   ? Staff Present Redge Gainer, BS, Exercise Physiologist;Danny Russella Dar, RN, Bjorn Loser, MS, ACSM-CEP, Exercise Physiologist   ? Virtual Visit No   ? Medication changes reported     No   ? Fall or balance concerns reported    No   ? Tobacco Cessation No Change   ? Warm-up and Cool-down Performed as group-led instruction   ? Resistance Training Performed Yes   ? VAD Patient? No   ?  ? Pain Assessment  ? Currently in Pain? No/denies   ? Pain Score 0-No pain   ? ?  ?  ? ?  ? ? ?Capillary Blood Glucose: ?No results found for this or any previous visit (from the past 24 hour(s)). ? ? ? ?Social History  ? ?Tobacco Use  ?Smoking Status Former  ? Types: Cigars  ?Smokeless Tobacco Never  ? ? ?Goals Met:  ?Independence with exercise equipment ?Exercise tolerated well ?No report of concerns or symptoms today ?Strength training completed today ? ?Goals Unmet:  ?Not Applicable ? ?Comments: checkout 1545  ? ? ?Dr. Carlyle Dolly is Medical Director for [place] ?

## 2021-05-30 ENCOUNTER — Encounter (HOSPITAL_COMMUNITY): Payer: Medicare HMO

## 2021-05-30 ENCOUNTER — Encounter (HOSPITAL_COMMUNITY)
Admission: RE | Admit: 2021-05-30 | Discharge: 2021-05-30 | Disposition: A | Discharge status: Home / Self Care | Payer: Medicare HMO | Source: Ambulatory Visit | Attending: Thoracic Surgery (Cardiothoracic Vascular Surgery) | Admitting: Thoracic Surgery (Cardiothoracic Vascular Surgery)

## 2021-05-30 ENCOUNTER — Other Ambulatory Visit: Payer: Self-pay | Admitting: Cardiology

## 2021-05-30 ENCOUNTER — Telehealth: Payer: Self-pay | Admitting: Pharmacist

## 2021-05-30 DIAGNOSIS — I5042 Chronic combined systolic (congestive) and diastolic (congestive) heart failure: Secondary | ICD-10-CM

## 2021-05-30 DIAGNOSIS — Z951 Presence of aortocoronary bypass graft: Secondary | ICD-10-CM

## 2021-05-30 DIAGNOSIS — Z952 Presence of prosthetic heart valve: Secondary | ICD-10-CM

## 2021-05-30 MED ORDER — SPIRONOLACTONE 25 MG PO TABS: 25.0000 mg | ORAL_TABLET | Freq: Every day | ORAL | 1 refills | Status: DC

## 2021-05-30 NOTE — Progress Notes (Signed)
Daily Session Note ? ?Patient Details  ?Name: Keith Wagner ?MRN: [id number] ?Date of Birth: May 04, 1940 ?Referring Provider:   ?Flowsheet Row CARDIAC REHAB PHASE II ORIENTATION from 05/01/2021 in [facility]  ?Referring Provider Dr. Roxan Hockey  ? ?  ? ? ?Encounter Date: 05/30/2021 ? ?Check In: ? Session Check In - 05/30/21 1442   ? ?  ? Check-In  ? Supervising physician immediately available to respond to emergencies [facility] MD immediately available   ? Physician(s) Dr. Domenic Polite   ? Location [facility]   ? Staff Present Redge Gainer, BS, Exercise Physiologist;Jariel Drost, RN;Debra Wynetta Emery, RN, BSN   ? Virtual Visit No   ? Medication changes reported     No   ? Fall or balance concerns reported    No   ? Tobacco Cessation No Change   ? Warm-up and Cool-down Performed as group-led instruction   ? Resistance Training Performed Yes   ? VAD Patient? No   ? PAD/SET Patient? No   ?  ? Pain Assessment  ? Currently in Pain? No/denies   ? Pain Score 0-No pain   ? Multiple Pain Sites No   ? ?  ?  ? ?  ? ? ?Capillary Blood Glucose: ?No results found for this or any previous visit (from the past 24 hour(s)). ? ? ? ?Social History  ? ?Tobacco Use  ?Smoking Status Former  ? Types: Cigars  ?Smokeless Tobacco Never  ? ? ?Goals Met:  ?Independence with exercise equipment ?Exercise tolerated well ?No report of concerns or symptoms today ?Strength training completed today ? ?Goals Unmet:  ?Not Applicable ? ?Comments: checkout @ 3:45pm ? ? ?Dr. Carlyle Dolly is Medical Director for [place] ?

## 2021-05-30 NOTE — Telephone Encounter (Signed)
Please see the telephone encounter from [facility].  ? ?Rex Kras, DO, FACC ? ?Pager: [phone] ?Office: [phone] ? ?

## 2021-06-01 ENCOUNTER — Encounter (HOSPITAL_COMMUNITY): Payer: Medicare HMO

## 2021-06-01 ENCOUNTER — Encounter (HOSPITAL_COMMUNITY)
Admission: RE | Admit: 2021-06-01 | Discharge: 2021-06-01 | Disposition: A | Discharge status: Home / Self Care | Payer: Medicare HMO | Source: Ambulatory Visit | Attending: Thoracic Surgery (Cardiothoracic Vascular Surgery) | Admitting: Thoracic Surgery (Cardiothoracic Vascular Surgery)

## 2021-06-01 DIAGNOSIS — Z951 Presence of aortocoronary bypass graft: Secondary | ICD-10-CM

## 2021-06-01 DIAGNOSIS — Z952 Presence of prosthetic heart valve: Secondary | ICD-10-CM

## 2021-06-01 NOTE — Progress Notes (Signed)
Daily Session Note ? ?Patient Details  ?Name: Keith Wagner ?MRN: [id number] ?Date of Birth: 09/26/1940 ?Referring Provider:   ?Flowsheet Row CARDIAC REHAB PHASE II ORIENTATION from 05/01/2021 in [facility]  ?Referring Provider Dr. Roxan Hockey  ? ?  ? ? ?Encounter Date: 06/01/2021 ? ?Check In: ? Session Check In - 06/01/21 1445   ? ?  ? Check-In  ? Supervising physician immediately available to respond to emergencies [facility] MD immediately available   ? Physician(s) Dr. Domenic Polite   ? Location [facility]   ? Staff Present Redge Gainer, BS, Exercise Physiologist;Gonzalo Waymire Kris Mouton, MS, ACSM-CEP, Exercise Physiologist;Phyllis Billingsley, RN   ? Virtual Visit No   ? Medication changes reported     No   ? Fall or balance concerns reported    No   ? Tobacco Cessation No Change   ? Warm-up and Cool-down Performed as group-led instruction   ? Resistance Training Performed Yes   ? VAD Patient? No   ? PAD/SET Patient? No   ?  ? Pain Assessment  ? Currently in Pain? No/denies   ? Pain Score 0-No pain   ? Multiple Pain Sites No   ? ?  ?  ? ?  ? ? ?Capillary Blood Glucose: ?No results found for this or any previous visit (from the past 24 hour(s)). ? ? ? ?Social History  ? ?Tobacco Use  ?Smoking Status Former  ? Types: Cigars  ?Smokeless Tobacco Never  ? ? ?Goals Met:  ?Independence with exercise equipment ?Exercise tolerated well ?No report of concerns or symptoms today ?Strength training completed today ? ?Goals Unmet:  ?Not Applicable ? ?Comments: checkout time is 1545 ? ? ?Dr. Carlyle Dolly is Medical Director for [place] ?

## 2021-06-04 ENCOUNTER — Encounter (HOSPITAL_COMMUNITY)
Admission: RE | Admit: 2021-06-04 | Discharge: 2021-06-04 | Disposition: A | Discharge status: Home / Self Care | Payer: Medicare HMO | Source: Ambulatory Visit | Attending: Thoracic Surgery (Cardiothoracic Vascular Surgery) | Admitting: Thoracic Surgery (Cardiothoracic Vascular Surgery)

## 2021-06-04 ENCOUNTER — Encounter (HOSPITAL_COMMUNITY): Payer: Medicare HMO

## 2021-06-04 VITALS — Wt 168.7 lb

## 2021-06-04 DIAGNOSIS — Z952 Presence of prosthetic heart valve: Secondary | ICD-10-CM | POA: Diagnosis not present

## 2021-06-04 DIAGNOSIS — Z951 Presence of aortocoronary bypass graft: Secondary | ICD-10-CM

## 2021-06-04 NOTE — Progress Notes (Signed)
Daily Session Note ? ?Patient Details  ?Name: Keith Wagner ?MRN: [id number] ?Date of Birth: [birth year] ?Referring Provider:   ?Flowsheet Row CARDIAC REHAB PHASE II ORIENTATION from 05/01/2021 in [place]  ?Referring Provider Dr. Roxan Hockey  ? ?  ? ? ?Encounter Date: 06/04/2021 ? ?Check In: ? Session Check In - 06/04/21 1445   ? ?  ? Check-In  ? Supervising physician immediately available to respond to emergencies [facility] MD immediately available   ? Physician(s) Dr. Johney Frame   ? Location [facility]   ? Staff Present Geanie Cooley, RN;Dalton Fletcher, MS, ACSM-CEP, Exercise Physiologist   ? Virtual Visit No   ? Medication changes reported     No   ? Fall or balance concerns reported    No   ? Tobacco Cessation No Change   ? Warm-up and Cool-down Performed as group-led instruction   ? Resistance Training Performed Yes   ? VAD Patient? No   ? PAD/SET Patient? No   ?  ? Pain Assessment  ? Currently in Pain? No/denies   ? Pain Score 0-No pain   ? Multiple Pain Sites No   ? ?  ?  ? ?  ? ? ?Capillary Blood Glucose: ?No results found for this or any previous visit (from the past 24 hour(s)). ? ? ? ?Social History  ? ?Tobacco Use  ?Smoking Status Former  ? Types: Cigars  ?Smokeless Tobacco Never  ? ? ?Goals Met:  ?Independence with exercise equipment ?Exercise tolerated well ?No report of concerns or symptoms today ?Strength training completed today ? ?Goals Unmet:  ?Not Applicable ? ?Comments: check out @ 3:45pm ? ? ?Dr. Carlyle Dolly is Medical Director for [place] ?

## 2021-06-06 ENCOUNTER — Encounter (HOSPITAL_COMMUNITY): Payer: Medicare HMO

## 2021-06-08 ENCOUNTER — Encounter (HOSPITAL_COMMUNITY): Payer: Medicare HMO

## 2021-06-08 ENCOUNTER — Encounter (HOSPITAL_COMMUNITY)
Admission: RE | Admit: 2021-06-08 | Discharge: 2021-06-08 | Disposition: A | Discharge status: Home / Self Care | Payer: Medicare HMO | Source: Ambulatory Visit | Attending: Thoracic Surgery (Cardiothoracic Vascular Surgery) | Admitting: Thoracic Surgery (Cardiothoracic Vascular Surgery)

## 2021-06-08 DIAGNOSIS — Z951 Presence of aortocoronary bypass graft: Secondary | ICD-10-CM

## 2021-06-08 DIAGNOSIS — Z952 Presence of prosthetic heart valve: Secondary | ICD-10-CM

## 2021-06-08 NOTE — Progress Notes (Signed)
Daily Session Note ? ?Patient Details  ?Name: Keith Wagner ?MRN: [id number] ?Date of Birth: 02/16/1941 ?Referring Provider:   ?Flowsheet Row CARDIAC REHAB PHASE II ORIENTATION from 05/01/2021 in [facility]  ?Referring Provider Dr. Hendrickson  ? ?  ? ? ?Encounter Date: 06/08/2021 ? ?Check In: ? Session Check In - 06/08/21 1435   ? ?  ? Check-In  ? Supervising physician immediately available to respond to emergencies [facility] MD immediately available   ? Physician(s) Dr branch   ? Location [facility]   ? Staff Present Phyllis Billingsley, RN;Debra Johnson, RN, BSN;Daphyne Martin, RN, BSN;Danny Gleber, RN, BSN   ? Virtual Visit No   ? Medication changes reported     No   ? Fall or balance concerns reported    No   ? Tobacco Cessation No Change   ? Warm-up and Cool-down Performed as group-led instruction   ? Resistance Training Performed Yes   ? VAD Patient? No   ? PAD/SET Patient? No   ?  ? Pain Assessment  ? Currently in Pain? No/denies   ? Pain Score 0-No pain   ? ?  ?  ? ?  ? ? ?Capillary Blood Glucose: ?No results found for this or any previous visit (from the past 24 hour(s)). ? ? ? ?Social History  ? ?Tobacco Use  ?Smoking Status Former  ? Types: Cigars  ?Smokeless Tobacco Never  ? ? ?Goals Met:  ?Independence with exercise equipment ?Exercise tolerated well ?No report of concerns or symptoms today ?Strength training completed today ? ?Goals Unmet:  ?Not Applicable ? ?Comments: checkout 1545 ? ? ?Dr. Jonathan Branch is Medical Director for [facility] ?

## 2021-06-09 LAB — BASIC METABOLIC PANEL
BUN/Creatinine Ratio: 14 (ref 10–24)
BUN: 20 mg/dL (ref 8–27)
CO2: 24 mmol/L (ref 20–29)
Calcium: 10 mg/dL (ref 8.6–10.2)
Chloride: 102 mmol/L (ref 96–106)
Creatinine, Ser: 1.43 mg/dL — ABNORMAL HIGH (ref 0.76–1.27)
Glucose: 110 mg/dL — ABNORMAL HIGH (ref 70–99)
Potassium: 5.3 mmol/L — ABNORMAL HIGH (ref 3.5–5.2)
Sodium: 139 mmol/L (ref 134–144)
eGFR: 50 mL/min/{1.73_m2} — ABNORMAL LOW (ref >59)

## 2021-06-09 LAB — MAGNESIUM: Magnesium: 2.4 mg/dL — ABNORMAL HIGH (ref 1.6–2.3)

## 2021-06-09 LAB — PRO B NATRIURETIC PEPTIDE: NT-Pro BNP: 378 pg/mL (ref 0–486)

## 2021-06-11 ENCOUNTER — Encounter (HOSPITAL_COMMUNITY)
Admission: RE | Admit: 2021-06-11 | Discharge: 2021-06-11 | Disposition: A | Discharge status: Home / Self Care | Payer: Medicare HMO | Source: Ambulatory Visit | Attending: Thoracic Surgery (Cardiothoracic Vascular Surgery) | Admitting: Thoracic Surgery (Cardiothoracic Vascular Surgery)

## 2021-06-11 ENCOUNTER — Encounter (HOSPITAL_COMMUNITY): Payer: Medicare HMO

## 2021-06-11 DIAGNOSIS — Z951 Presence of aortocoronary bypass graft: Secondary | ICD-10-CM

## 2021-06-11 DIAGNOSIS — Z952 Presence of prosthetic heart valve: Secondary | ICD-10-CM

## 2021-06-11 NOTE — Progress Notes (Signed)
Daily Session Note ? ?Patient Details  ?Name: Keith Wagner ?MRN: [id number] ?Date of Birth: May 13, 1940 ?Referring Provider:   ?Flowsheet Row CARDIAC REHAB PHASE II ORIENTATION from 05/01/2021 in [place]  ?Referring Provider Dr. Roxan Hockey  ? ?  ? ? ?Encounter Date: 06/11/2021 ? ?Check In: ? Session Check In - 06/11/21 1440   ? ?  ? Check-In  ? Supervising physician immediately available to respond to emergencies [facility] MD immediately available   ? Physician(s) Dr Harl Bowie   ? Location [facility]   ? Staff Present Redge Gainer, BS, Exercise Physiologist;Debra Wynetta Emery, RN, Joanette Gula, RN, Madlyn Frankel, RN, BSN   ? Virtual Visit No   ? Medication changes reported     No   ? Tobacco Cessation No Change   ? Warm-up and Cool-down Performed as group-led instruction   ? Resistance Training Performed Yes   ? VAD Patient? No   ? PAD/SET Patient? No   ?  ? Pain Assessment  ? Currently in Pain? No/denies   ? Pain Score 0-No pain   ? Multiple Pain Sites No   ? ?  ?  ? ?  ? ? ?Capillary Blood Glucose: ?No results found for this or any previous visit (from the past 24 hour(s)). ? ? ? ?Social History  ? ?Tobacco Use  ?Smoking Status Former  ? Types: Cigars  ?Smokeless Tobacco Never  ? ? ?Goals Met:  ?Independence with exercise equipment ?Exercise tolerated well ?No report of concerns or symptoms today ?Strength training completed today ? ?Goals Unmet:  ?Not Applicable ? ?Comments: Check out 1545 ? ? ?Dr. Carlyle Dolly is Medical Director for [place] ?

## 2021-06-11 NOTE — Progress Notes (Signed)
Called reviewed lab results with pt and pt's wife. Pt confirmed that they stopped lasix and potassium supplement prior to starting spironolactone 25 mg. Pt denies any excess dietary potassium intake or other herbal supplements. Home BP readings have improved since starting spironolactone. BP avg over the past week of 133/85 HR: 67. Pt denies any complains of lightheadedness, dizziness, SOB, edema, dyspnea concerns. Pt  reports that he currently only drinks one 16 oz bottle of water a day. Encouraged pt to increase fluid intake to 3-4 bottles of 16 oz of water daily. In the meantime, pt agrees to decrease spironolactone dose to 12.5 mg and getting repeat labs completed in 1 week. Will continue monitoring home BP readings and follow closely.

## 2021-06-13 ENCOUNTER — Encounter (HOSPITAL_COMMUNITY): Payer: Medicare HMO

## 2021-06-13 ENCOUNTER — Encounter (HOSPITAL_COMMUNITY)
Admission: RE | Admit: 2021-06-13 | Discharge: 2021-06-13 | Disposition: A | Discharge status: Home / Self Care | Payer: Medicare HMO | Source: Ambulatory Visit | Attending: Thoracic Surgery (Cardiothoracic Vascular Surgery) | Admitting: Thoracic Surgery (Cardiothoracic Vascular Surgery)

## 2021-06-13 DIAGNOSIS — Z951 Presence of aortocoronary bypass graft: Secondary | ICD-10-CM

## 2021-06-13 DIAGNOSIS — Z952 Presence of prosthetic heart valve: Secondary | ICD-10-CM

## 2021-06-13 NOTE — Progress Notes (Addendum)
Daily Session Note ? ?Patient Details  ?Name: Keith Wagner ?MRN: [id number] ?Date of Birth: 08/13/1940 ?Referring Provider:   ?Flowsheet Row CARDIAC REHAB PHASE II ORIENTATION from 05/01/2021 in [place]  ?Referring Provider Dr. Roxan Hockey  ? ?  ? ? ?Encounter Date: 06/13/2021 ? ?Check In: ? Session Check In - 06/13/21 1438   ? ?  ? Check-In  ? Supervising physician immediately available to respond to emergencies [facility] MD immediately available   ? Physician(s) Dr Domenic Polite   ? Location [facility]   ? Staff Present Geanie Cooley, RN;Heather Otho Ket, [place], Exercise Physiologist;Oris Staffieri Hassell Done, RN, Bjorn Loser, MS, ACSM-CEP, Exercise Physiologist   ? Virtual Visit No   ? Medication changes reported     No   ? Fall or balance concerns reported    No   ? Tobacco Cessation No Change   ? Warm-up and Cool-down Performed as group-led instruction   ? Resistance Training Performed Yes   ? VAD Patient? No   ? PAD/SET Patient? No   ?  ? Pain Assessment  ? Currently in Pain? No/denies   ? Pain Score 0-No pain   ? Multiple Pain Sites No   ? ?  ?  ? ?  ? ? ?Capillary Blood Glucose: ?No results found for this or any previous visit (from the past 24 hour(s)). ? ? ? ?Social History  ? ?Tobacco Use  ?Smoking Status Former  ? Types: Cigars  ?Smokeless Tobacco Never  ? ? ?Goals Met:  ?Independence with exercise equipment ?Exercise tolerated well ?No report of concerns or symptoms today ?Strength training completed today ? ?Goals Unmet:  ?Not Applicable ? ?Comments: Check out 1545. ? ? ?Dr. Carlyle Dolly is Medical Director for [place] ?

## 2021-06-15 ENCOUNTER — Encounter (HOSPITAL_COMMUNITY)
Admission: RE | Admit: 2021-06-15 | Discharge: 2021-06-15 | Disposition: A | Discharge status: Home / Self Care | Payer: Medicare HMO | Source: Ambulatory Visit | Attending: Thoracic Surgery (Cardiothoracic Vascular Surgery) | Admitting: Thoracic Surgery (Cardiothoracic Vascular Surgery)

## 2021-06-15 ENCOUNTER — Encounter (HOSPITAL_COMMUNITY): Payer: Medicare HMO

## 2021-06-15 DIAGNOSIS — Z951 Presence of aortocoronary bypass graft: Secondary | ICD-10-CM

## 2021-06-15 DIAGNOSIS — Z952 Presence of prosthetic heart valve: Secondary | ICD-10-CM | POA: Diagnosis not present

## 2021-06-15 NOTE — Progress Notes (Signed)
Daily Session Note ? ?Patient Details  ?Name: Keith Wagner ?MRN: [id number] ?Date of Birth: January 10, 1941 ?Referring Provider:   ?Flowsheet Row CARDIAC REHAB PHASE II ORIENTATION from 05/01/2021 in [place]  ?Referring Provider Dr. Roxan Hockey  ? ?  ? ? ?Encounter Date: 06/15/2021 ? ?Check In: ? Session Check In - 06/15/21 1434   ? ?  ? Check-In  ? Supervising physician immediately available to respond to emergencies [facility] MD immediately available   ? Physician(s) Dr Gardiner Rhyme   ? Location [facility]   ? Staff Present Redge Gainer, BS, Exercise Physiologist;Lincy Belles Hassell Done, RN, BSN;Christy Edwards, RN, BSN   ? Virtual Visit No   ? Medication changes reported     No   ? Fall or balance concerns reported    No   ? Tobacco Cessation No Change   ? Warm-up and Cool-down Performed as group-led instruction   ? Resistance Training Performed Yes   ? VAD Patient? No   ? PAD/SET Patient? No   ?  ? Pain Assessment  ? Currently in Pain? No/denies   ? Pain Score 0-No pain   ? Multiple Pain Sites No   ? ?  ?  ? ?  ? ? ?Capillary Blood Glucose: ?No results found for this or any previous visit (from the past 24 hour(s)). ? ? ? ?Social History  ? ?Tobacco Use  ?Smoking Status Former  ? Types: Cigars  ?Smokeless Tobacco Never  ? ? ?Goals Met:  ?Independence with exercise equipment ?Exercise tolerated well ?No report of concerns or symptoms today ?Strength training completed today ? ?Goals Unmet:  ?Not Applicable ? ?Comments: Check out  ? ? ?Dr. Carlyle Dolly is Medical Director for [place] ?

## 2021-06-18 ENCOUNTER — Encounter (HOSPITAL_COMMUNITY): Payer: Medicare HMO

## 2021-06-18 ENCOUNTER — Encounter (HOSPITAL_COMMUNITY)
Admission: RE | Admit: 2021-06-18 | Discharge: 2021-06-18 | Disposition: A | Discharge status: Home / Self Care | Payer: Medicare HMO | Source: Ambulatory Visit | Attending: Thoracic Surgery (Cardiothoracic Vascular Surgery) | Admitting: Thoracic Surgery (Cardiothoracic Vascular Surgery)

## 2021-06-18 DIAGNOSIS — Z952 Presence of prosthetic heart valve: Secondary | ICD-10-CM

## 2021-06-18 DIAGNOSIS — Z951 Presence of aortocoronary bypass graft: Secondary | ICD-10-CM

## 2021-06-18 NOTE — Progress Notes (Signed)
Daily Session Note ? ?Patient Details  ?Name: Keith Wagner ?MRN: [id number] ?Date of Birth: 1940/07/22 ?Referring Provider:   ?Flowsheet Row CARDIAC REHAB PHASE II ORIENTATION from 05/01/2021 in [place]  ?Referring Provider Dr. Roxan Hockey  ? ?  ? ? ?Encounter Date: 06/18/2021 ? ?Check In: ? Session Check In - 06/18/21 1438   ? ?  ? Check-In  ? Supervising physician immediately available to respond to emergencies [facility] MD immediately available   ? Physician(s) Dr Marlou Porch   ? Staff Present Aundra Dubin, RN, Joanette Gula, RN, BSN;Phyllis Billingsley, RN;Danny Russella Dar, RN, BSN;Heather Otho Ket, BS, Exercise Physiologist   ? Virtual Visit No   ? Medication changes reported     No   ? Fall or balance concerns reported    No   ? Tobacco Cessation No Change   ? Warm-up and Cool-down Performed as group-led instruction   ? Resistance Training Performed Yes   ? VAD Patient? No   ? PAD/SET Patient? No   ?  ? Pain Assessment  ? Currently in Pain? No/denies   ? Pain Score 0-No pain   ? Multiple Pain Sites No   ? ?  ?  ? ?  ? ? ?Capillary Blood Glucose: ?No results found for this or any previous visit (from the past 24 hour(s)). ? ? ? ?Social History  ? ?Tobacco Use  ?Smoking Status Former  ? Types: Cigars  ?Smokeless Tobacco Never  ? ? ?Goals Met:  ?Independence with exercise equipment ?Exercise tolerated well ?No report of concerns or symptoms today ?Strength training completed today ? ?Goals Unmet:  ?Not Applicable ? ?Comments: Checkout at 82. ? ? ?Dr. Carlyle Dolly is Medical Director for [place] ?

## 2021-06-19 ENCOUNTER — Other Ambulatory Visit: Payer: Self-pay

## 2021-06-19 DIAGNOSIS — I5042 Chronic combined systolic (congestive) and diastolic (congestive) heart failure: Secondary | ICD-10-CM

## 2021-06-20 ENCOUNTER — Encounter (HOSPITAL_COMMUNITY)
Admission: RE | Admit: 2021-06-20 | Discharge: 2021-06-20 | Disposition: A | Discharge status: Home / Self Care | Payer: Medicare HMO | Source: Ambulatory Visit | Attending: Thoracic Surgery (Cardiothoracic Vascular Surgery) | Admitting: Thoracic Surgery (Cardiothoracic Vascular Surgery)

## 2021-06-20 ENCOUNTER — Encounter (HOSPITAL_COMMUNITY): Payer: Medicare HMO

## 2021-06-20 DIAGNOSIS — Z952 Presence of prosthetic heart valve: Secondary | ICD-10-CM

## 2021-06-20 DIAGNOSIS — Z951 Presence of aortocoronary bypass graft: Secondary | ICD-10-CM

## 2021-06-20 LAB — BASIC METABOLIC PANEL
BUN/Creatinine Ratio: 23 (ref 10–24)
BUN: 45 mg/dL — ABNORMAL HIGH (ref 8–27)
CO2: 20 mmol/L (ref 20–29)
Calcium: 10.3 mg/dL — ABNORMAL HIGH (ref 8.6–10.2)
Chloride: 99 mmol/L (ref 96–106)
Creatinine, Ser: 1.95 mg/dL — ABNORMAL HIGH (ref 0.76–1.27)
Glucose: 107 mg/dL — ABNORMAL HIGH (ref 70–99)
Potassium: 5.8 mmol/L — ABNORMAL HIGH (ref 3.5–5.2)
Sodium: 136 mmol/L (ref 134–144)
eGFR: 34 mL/min/{1.73_m2} — ABNORMAL LOW (ref >59)

## 2021-06-20 LAB — PRO B NATRIURETIC PEPTIDE: NT-Pro BNP: 269 pg/mL (ref 0–486)

## 2021-06-20 LAB — MAGNESIUM: Magnesium: 2.5 mg/dL — ABNORMAL HIGH (ref 1.6–2.3)

## 2021-06-20 NOTE — Progress Notes (Signed)
I have reviewed a Home Exercise Prescription with Keith Wagner . [name] is currently exercising at home.  The patient was advised to walk 2 days a week for 30-45 minutes.  [name] and I discussed how to progress their exercise prescription.  The patient stated that their goals were build his endurance so he can do yard work.  The patient stated that they understand the exercise prescription.  We reviewed exercise guidelines, target heart rate during exercise, RPE Scale, weather conditions, NTG use, endpoints for exercise, warmup and cool down.  Patient is encouraged to come to me with any questions. I will continue to follow up with the patient to assist them with progression and safety.    ?

## 2021-06-20 NOTE — Progress Notes (Signed)
Cardiac Individual Treatment Plan ? ?Patient Details  ?Name: Keith Wagner ?MRN: [id number] ?Date of Birth: 01-26-1941 ?Referring Provider:   ?Flowsheet Row CARDIAC REHAB PHASE II ORIENTATION from 05/01/2021 in [place]  ?Referring Provider Dr. Roxan Hockey  ? ?  ? ? ?Initial Encounter Date:  ?Flowsheet Row CARDIAC REHAB PHASE II ORIENTATION from 05/01/2021 in [place]  ?Date 05/01/21  ? ?  ? ? ?Visit Diagnosis: S/P CABG x 2 ? ?S/P AVR (aortic valve replacement) ? ?Patient's Home Medications on Admission: ? ?Current Outpatient Medications:  ?  acetaminophen (TYLENOL) 500 MG tablet, Take 1,000 mg by mouth every 6 (six) hours as needed for moderate pain., Disp: , Rfl:  ?  amiodarone (PACERONE) 200 MG tablet, Take 1 tablet (200 mg total) by mouth daily., Disp: , Rfl:  ?  apixaban (ELIQUIS) 5 MG TABS tablet, Take 1 tablet (5 mg total) by mouth 2 (two) times daily., Disp: 60 tablet, Rfl: 3 ?  aspirin 81 MG tablet, Take 81 mg by mouth daily., Disp: , Rfl:  ?  atorvastatin (LIPITOR) 40 MG tablet, Take 1 tablet (40 mg total) by mouth daily., Disp: 30 tablet, Rfl: 3 ?  calcium carbonate (TUMS - DOSED IN MG ELEMENTAL CALCIUM) 500 MG chewable tablet, Chew 500-1,000 mg by mouth daily as needed for indigestion or heartburn., Disp: , Rfl:  ?  clobetasol cream (TEMOVATE) 0.16 %, Apply 1 application topically 2 (two) times daily as needed (eczema)., Disp: , Rfl:  ?  dapagliflozin propanediol (FARXIGA) 10 MG TABS tablet, Take by mouth daily., Disp: , Rfl:  ?  feeding supplement (ENSURE ENLIVE / ENSURE PLUS) LIQD, Take 237 mLs by mouth 2 (two) times daily between meals., Disp: 237 mL, Rfl: 12 ?  Lutein 6 MG TABS, Take 6 mg by mouth daily., Disp: , Rfl:  ?  magnesium oxide (MAG-OX) 400 MG tablet, Take 1 tablet (400 mg total) by mouth daily., Disp: 30 tablet, Rfl: 11 ?  metoprolol succinate (TOPROL-XL) 25 MG 24 hr tablet, TAKE 1 TABLET BY MOUTH ONCE DAILY. HOLD IF TOP BLOOD PRESSURE NUMBER  IS LESS THAN 100 MMHG OR PILSE LES THAN 60 BPM (Patient taking differently: Take 25 mg by mouth daily. HOLD IF TOP BLOOD PRESSURE NUMBER IS LESS THAN 100 MMHG OR PILSE LES THAN 60 BPM), Disp: 30 tablet, Rfl: 3 ?  Multiple Vitamins-Minerals (MULTIVITAMIN PO), Take 1 tablet by mouth daily., Disp: , Rfl:  ?  Omega-3 Fatty Acids (FISH OIL) 1000 MG CAPS, Take 1,000 mg by mouth daily., Disp: , Rfl:  ?  OVER THE COUNTER MEDICATION, Take 1 tablet by mouth daily. Super Beta Prostate Advanced, Disp: , Rfl:  ?  sacubitril-valsartan (ENTRESTO) 97-103 MG, Take 1 tablet by mouth 2 (two) times daily., Disp: 180 tablet, Rfl: 0 ?  spironolactone (ALDACTONE) 25 MG tablet, Take 1 tablet (25 mg total) by mouth daily., Disp: 30 tablet, Rfl: 1 ?  triamcinolone cream (KENALOG) 0.1 %, Apply 1 application topically 2 (two) times daily as needed (eczema)., Disp: , Rfl:  ? ?Past Medical History: ?Past Medical History:  ?Diagnosis Date  ? Arthritis   ? Asthma   ? uses inhaler PRN  ? Bilateral carotid bruits 06/12/2018  ? Cataract   ? Diabetes mellitus without complication ([place])   ? per pt "pre-diabetic"- off Metformin- diet controlled   ? GERD (gastroesophageal reflux disease)   ? Gout   ? Heart murmur   ? Hypercholesteremia 06/12/2018  ? Hyperlipidemia   ?  Hypertension   ? Mild aortic stenosis 06/12/2018  ? ? ?Tobacco Use: ?Social History  ? ?Tobacco Use  ?Smoking Status Former  ? Types: Cigars  ?Smokeless Tobacco Never  ? ? ?Labs: ?Review Flowsheet   ? ?  ?  Latest Ref Rng & Units 02/06/2021 02/27/2021 03/01/2021 03/03/2021  ?Labs for ITP Cardiac and Pulmonary Rehab  ?Hemoglobin A1c 4.8 - 5.6 %  6.3      ?PH, Arterial 7.350 - 7.450 7.380   7.397   7.364    ? 7.318    ? 7.352    ? 7.322    ? 7.334    ? 7.400    ? 7.432    ? 7.447    ? 7.452    ? 7.444   7.419    ? 7.362    ? 7.360    ?PCO2 arterial 32.0 - 48.0 mmHg 35.7   35.4   36.2    ? 37.3    ? 36.4    ? 38.3    ? 41.2    ? 40.0    ? 36.5    ? 35.3    ? 35.6    ? 34.4   34.7    ? 40.0    ?  28.7    ?Bicarbonate 20.0 - 28.0 mmol/L 21.1    ? 20.9   21.3   20.7    ? 19.2    ? 20.3    ? 19.9    ? 21.9    ? 24.8    ? 24.3    ? 24.4    ? 24.9    ? 22.4    ? 23.6   22.6    ? 22.7    ? 16.2    ?TCO2 22 - 32 mmol/L 22    ? 22    22    ? 20    ? 21    ? 21    ? 22    ? 23    ? 26    ? 24    ? 25    ? 25    ? 26    ? 26    ? 23    ? 25    ? 22    ? 24   24    ? 24    ? 17    ?Acid-base deficit 0.0 - 2.0 mmol/L 3.0    ? 5.0   2.8   4.0    ? 6.0    ? 5.0    ? 6.0    ? 4.0    ? 2.0   2.0    ? 3.0    ? 8.0    ?O2 Saturation % 97.0    ? 68.0   96.9   95.0    ? 98.0    ? 99.0    ? 100.0    ? 100.0    ? 100.0    ? 100.0    ? 100.0    ? 100.0    ? 80.0    ? 100.0   97.0    ? 59.0    ? 78.0    ? 52.3    ? ?  03/04/2021  ?Labs for ITP Cardiac and Pulmonary Rehab  ?Hemoglobin A1c   ?PH, Arterial   ?PCO2 arterial   ?Bicarbonate   ?TCO2   ?Acid-base deficit   ?O2 Saturation 62.9    ?  ? ?  Multiple values from one day are sorted in reverse-chronological order  ?  ?  ? ? ?Capillary Blood Glucose: ?Lab Results  ?Component Value Date  ? GLUCAP 152 (H) 05/11/2021  ? GLUCAP 118 (H) 05/09/2021  ? GLUCAP 106 (H) 05/01/2021  ? GLUCAP 128 (H) 03/11/2021  ? GLUCAP 108 (H) 03/11/2021  ? ? ? ?Exercise Target Goals: ?Exercise Program Goal: ?Individual exercise prescription set using results from initial 6 min walk test and THRR while considering  patient?s activity barriers and safety.  ? ?Exercise Prescription Goal: ?Starting with aerobic activity 30 plus minutes a day, 3 days per week for initial exercise prescription. Provide home exercise prescription and guidelines that participant acknowledges understanding prior to discharge. ? ?Activity Barriers & Risk Stratification: ? Activity Barriers & Cardiac Risk Stratification - 05/01/21 0825   ? ?  ? Activity Barriers & Cardiac Risk Stratification  ? Activity Barriers None   ? Cardiac Risk Stratification High   ? ?  ?  ? ?  ? ? ?6 Minute Walk: ? 6 Minute Walk   ? ? [place] Name 05/01/21 1013  ?   ?  ?  ? 6 Minute Walk  ? Phase Initial    ? Distance 1000 feet    ? Walk Time 6 minutes    ? # of Rest Breaks 1    ? MPH 1.9    ? METS 1.84    ? RPE 12    ? VO2 Peak 6.42    ? Symptoms Yes (comment)    ? Comments one standing rest break 15 sec    ? Resting HR 62 bpm    ? Resting BP 116/60    ? Resting Oxygen Saturation  97 %    ? Exercise Oxygen Saturation  during 6 min walk 98 %    ? Max Ex. HR 90 bpm    ? Max Ex. BP 128/65    ? 2 Minute Post BP 118/63    ? ?  ?  ? ?  ? ? ?Oxygen Initial Assessment: ? ? ?Oxygen Re-Evaluation: ? ? ?Oxygen Discharge (Final Oxygen Re-Evaluation): ? ? ?Initial Exercise Prescription: ? Initial Exercise Prescription - 05/01/21 1000   ? ?  ? Date of Initial Exercise RX and Referring Provider  ? Date 05/01/21   ? Referring Provider Dr. Roxan Hockey   ? Expected Discharge Date 07/20/21   ?  ? Treadmill  ? MPH 1.4   ? Grade 0   ? Minutes 17   ?  ? NuStep  ? Level 1   ? SPM 60   ? Minutes 22   ?  ? Prescription Details  ? Frequency (times per week) 3   ? Duration Progress to 30 minutes of continuous aerobic without signs/symptoms of physical distress   ?  ? Intensity  ? THRR 40-80% of Max Heartrate 56-113   ? Ratings of Perceived Exertion 11-13   ? Perceived Dyspnea 0-4   ?  ? Resistance Training  ? Training Prescription Yes   ? Weight 3   ? Reps 10-15   ? ?  ?  ? ?  ? ? ?Perform Capillary Blood Glucose checks as needed. ? ?Exercise Prescription Changes: ? ? Exercise Prescription Changes   ? ? [place] Name 05/07/21 1543 05/21/21 1540 06/04/21 1554 06/18/21 1554  ?  ?  ? Response to Exercise  ? Blood Pressure (Admit) 110/58 120/70 138/70 122/66   ? Blood Pressure (Exercise) 140/82 128/62 130/80 106/60   ?  Blood Pressure (Exit) 120/80 130/80 130/70 100/70   ? Heart Rate (Admit) 65 bpm 69 bpm 63 bpm 59 bpm   ? Heart Rate (Exercise) 106 bpm 109 bpm 96 bpm 110 bpm   ? Heart Rate (Exit) 83 bpm 77 bpm 77 bpm 68 bpm   ? Rating of Perceived Exertion (Exercise) '12 11 11 12   '$ ? Duration Continue with 30 min  of aerobic exercise without signs/symptoms of physical distress. Continue with 30 min of aerobic exercise without signs/symptoms of physical distress. Continue with 30 min of aerobic exercise without s

## 2021-06-20 NOTE — Progress Notes (Signed)
Daily Session Note ? ?Patient Details  ?Name: Keith Wagner ?MRN: [id number] ?Date of Birth: 04/28/1940 ?Referring Provider:   ?Flowsheet Row CARDIAC REHAB PHASE II ORIENTATION from 05/01/2021 in [place]  ?Referring Provider Dr. Roxan Hockey  ? ?  ? ? ?Encounter Date: 06/20/2021 ? ?Check In: ? Session Check In - 06/20/21 1435   ? ?  ? Check-In  ? Supervising physician immediately available to respond to emergencies [facility] MD immediately available   ? Physician(s) Dr Harl Bowie   ? Location [facility]-Cardiac & Pulmonary Rehab   ? Staff Present Aundra Dubin, RN, Joanette Gula, RN, BSN;Heather Otho Ket, BS, Exercise Physiologist   ? Virtual Visit No   ? Medication changes reported     No   ? Fall or balance concerns reported    No   ? Tobacco Cessation No Change   ? Warm-up and Cool-down Performed as group-led instruction   ? Resistance Training Performed Yes   ? VAD Patient? No   ? PAD/SET Patient? No   ?  ? Pain Assessment  ? Currently in Pain? No/denies   ? Pain Score 0-No pain   ? Multiple Pain Sites No   ? ?  ?  ? ?  ? ? ?Capillary Blood Glucose: ?No results found for this or any previous visit (from the past 24 hour(s)). ? ? ? ?Social History  ? ?Tobacco Use  ?Smoking Status Former  ? Types: Cigars  ?Smokeless Tobacco Never  ? ? ?Goals Met:  ?Independence with exercise equipment ?Exercise tolerated well ?No report of concerns or symptoms today ?Strength training completed today ? ?Goals Unmet:  ?Not Applicable ? ?Comments: Check out at 1545 ? ? ?Dr. Carlyle Dolly is Medical Director for [place] ?

## 2021-06-22 ENCOUNTER — Encounter (HOSPITAL_COMMUNITY): Payer: Medicare HMO

## 2021-06-22 ENCOUNTER — Encounter (HOSPITAL_COMMUNITY)
Admission: RE | Admit: 2021-06-22 | Discharge: 2021-06-22 | Disposition: A | Discharge status: Home / Self Care | Payer: Medicare HMO | Source: Ambulatory Visit | Attending: Thoracic Surgery (Cardiothoracic Vascular Surgery) | Admitting: Thoracic Surgery (Cardiothoracic Vascular Surgery)

## 2021-06-22 DIAGNOSIS — Z952 Presence of prosthetic heart valve: Secondary | ICD-10-CM

## 2021-06-22 DIAGNOSIS — Z951 Presence of aortocoronary bypass graft: Secondary | ICD-10-CM

## 2021-06-22 NOTE — Progress Notes (Signed)
Daily Session Note ? ?Patient Details  ?Name: Keith Wagner ?MRN: [id number] ?Date of Birth: 07-30-1940 ?Referring Provider:   ?Flowsheet Row CARDIAC REHAB PHASE II ORIENTATION from 05/01/2021 in [place]  ?Referring Provider Dr. Roxan Hockey  ? ?  ? ? ?Encounter Date: 06/22/2021 ? ?Check In: ? Session Check In - 06/22/21 1439   ? ?  ? Check-In  ? Supervising physician immediately available to respond to emergencies [facility] MD immediately available   ? Physician(s) Dr Harl Bowie   ? Location [facility]-Cardiac & Pulmonary Rehab   ? Staff Present Geanie Cooley, RN;Debra Wynetta Emery, RN, BSN;Christy Edwards, RN, Joanette Gula, RN, Bjorn Loser, MS, ACSM-CEP, Exercise Physiologist   ? Virtual Visit No   ? Medication changes reported     No   ? Fall or balance concerns reported    No   ? Tobacco Cessation No Change   ? Warm-up and Cool-down Performed as group-led instruction   ? Resistance Training Performed Yes   ? VAD Patient? No   ? PAD/SET Patient? No   ?  ? Pain Assessment  ? Currently in Pain? No/denies   ? Pain Score 0-No pain   ? Multiple Pain Sites No   ? ?  ?  ? ?  ? ? ?Capillary Blood Glucose: ?No results found for this or any previous visit (from the past 24 hour(s)). ? ? ? ?Social History  ? ?Tobacco Use  ?Smoking Status Former  ? Types: Cigars  ?Smokeless Tobacco Never  ? ? ?Goals Met:  ?Independence with exercise equipment ?Exercise tolerated well ?No report of concerns or symptoms today ?Strength training completed today ? ?Goals Unmet:  ?Not Applicable ? ?Comments: Checkout at 1345. ? ? ?Dr. Carlyle Dolly is Medical Director for [place] ?

## 2021-06-25 ENCOUNTER — Other Ambulatory Visit: Payer: Self-pay | Admitting: Surgical

## 2021-06-25 ENCOUNTER — Encounter (HOSPITAL_COMMUNITY)
Admission: RE | Admit: 2021-06-25 | Discharge: 2021-06-25 | Disposition: A | Discharge status: Home / Self Care | Payer: Medicare HMO | Source: Ambulatory Visit | Attending: Thoracic Surgery (Cardiothoracic Vascular Surgery) | Admitting: Thoracic Surgery (Cardiothoracic Vascular Surgery)

## 2021-06-25 ENCOUNTER — Encounter (HOSPITAL_COMMUNITY): Payer: Medicare HMO

## 2021-06-25 DIAGNOSIS — Z952 Presence of prosthetic heart valve: Secondary | ICD-10-CM | POA: Insufficient documentation

## 2021-06-25 DIAGNOSIS — Z951 Presence of aortocoronary bypass graft: Secondary | ICD-10-CM | POA: Insufficient documentation

## 2021-06-25 DIAGNOSIS — Z79899 Other long term (current) drug therapy: Secondary | ICD-10-CM

## 2021-06-25 NOTE — Progress Notes (Signed)
Daily Session Note ? ?Patient Details  ?Name: Keith Wagner ?MRN: [id number] ?Date of Birth: 1940/11/09 ?Referring Provider:   ?Flowsheet Row CARDIAC REHAB PHASE II ORIENTATION from 05/01/2021 in [place]  ?Referring Provider Dr. Roxan Hockey  ? ?  ? ? ?Encounter Date: 06/25/2021 ? ?Check In: ? Session Check In - 06/25/21 1444   ? ?  ? Check-In  ? Supervising physician immediately available to respond to emergencies [facility] MD immediately available   ? Physician(s) Dr Harl Bowie   ? Location [facility]-Cardiac & Pulmonary Rehab   ? Staff Present Geanie Cooley, RN;Heather Otho Ket, BS, Exercise Physiologist;Dalton Fletcher, MS, ACSM-CEP, Exercise Physiologist   ? Virtual Visit No   ? Medication changes reported     No   ? Fall or balance concerns reported    No   ? Tobacco Cessation No Change   ? Warm-up and Cool-down Performed as group-led instruction   ? Resistance Training Performed Yes   ? VAD Patient? No   ? PAD/SET Patient? No   ?  ? Pain Assessment  ? Currently in Pain? No/denies   ? Pain Score 0-No pain   ? Multiple Pain Sites No   ? ?  ?  ? ?  ? ? ?Capillary Blood Glucose: ?No results found for this or any previous visit (from the past 24 hour(s)). ? ? ? ?Social History  ? ?Tobacco Use  ?Smoking Status Former  ? Types: Cigars  ?Smokeless Tobacco Never  ? ? ?Goals Met:  ?Independence with exercise equipment ?Exercise tolerated well ?No report of concerns or symptoms today ?Strength training completed today ? ?Goals Unmet:  ?Not Applicable ? ?Comments: check out @ 3:45pm ? ? ?Dr. Carlyle Dolly is Medical Director for [place] ?

## 2021-06-27 ENCOUNTER — Telehealth: Payer: Self-pay | Admitting: Pharmacist

## 2021-06-27 ENCOUNTER — Encounter (HOSPITAL_COMMUNITY)
Admission: RE | Admit: 2021-06-27 | Discharge: 2021-06-27 | Disposition: A | Discharge status: Home / Self Care | Payer: Medicare HMO | Source: Ambulatory Visit | Attending: Thoracic Surgery (Cardiothoracic Vascular Surgery) | Admitting: Thoracic Surgery (Cardiothoracic Vascular Surgery)

## 2021-06-27 ENCOUNTER — Encounter (HOSPITAL_COMMUNITY): Payer: Medicare HMO

## 2021-06-27 DIAGNOSIS — Z952 Presence of prosthetic heart valve: Secondary | ICD-10-CM

## 2021-06-27 DIAGNOSIS — Z951 Presence of aortocoronary bypass graft: Secondary | ICD-10-CM | POA: Diagnosis not present

## 2021-06-27 DIAGNOSIS — I5042 Chronic combined systolic (congestive) and diastolic (congestive) heart failure: Secondary | ICD-10-CM

## 2021-06-27 NOTE — Progress Notes (Signed)
Discussed lab results with pt last week. Per discussion with pt last week, misinterpreted the recommendation to decrease spironolactone from 25 mg to 12.5 mg. Instead pt had been cutting his potassium supplement tablets in half and continuing with spironolactone 25 mg. Pt had stopped their lasix as instructed previously. Most recent labs reflect pt being on spironolactone 25 mg and Klor 10 mg. Pt stopped Klor and has been on spironolactone 12.5 mg since last week. Pt scheduled to get repeat labs tomorrow for 1 week f/u since those changes. Pt has been making active effort to stay well hydrated and avoiding any excess dietary potassium intakes. Denies any complains of nausea, vomiting, palpitations, SOB, CP, muscle weakness. Will wait for repeat labs to be able to determine next steps with regarding to managing pt's hyperkalemia concerns.

## 2021-06-27 NOTE — Progress Notes (Signed)
Daily Session Note ? ?Patient Details  ?Name: Keith Wagner ?MRN: [id number] ?Date of Birth: 1940/12/10 ?Referring Provider:   ?Flowsheet Row CARDIAC REHAB PHASE II ORIENTATION from 05/01/2021 in [place]  ?Referring Provider Dr. Roxan Hockey  ? ?  ? ? ?Encounter Date: 06/27/2021 ? ?Check In: ? Session Check In - 06/27/21 1440   ? ?  ? Check-In  ? Supervising physician immediately available to respond to emergencies [facility] MD immediately available   ? Physician(s) Dr Harl Bowie   ? Location [facility]-Cardiac & Pulmonary Rehab   ? Staff Present Madelyn Flavors, RN, Bjorn Loser, MS, ACSM-CEP, Exercise Physiologist;Heather Zigmund Daniel, Exercise Physiologist   ? Virtual Visit No   ? Medication changes reported     No   ? Fall or balance concerns reported    No   ? Tobacco Cessation No Change   ? Warm-up and Cool-down Performed as group-led instruction   ? Resistance Training Performed Yes   ? VAD Patient? No   ? PAD/SET Patient? No   ?  ? Pain Assessment  ? Currently in Pain? No/denies   ? Pain Score 0-No pain   ? Multiple Pain Sites No   ? ?  ?  ? ?  ? ? ?Capillary Blood Glucose: ?No results found for this or any previous visit (from the past 24 hour(s)). ? ? ? ?Social History  ? ?Tobacco Use  ?Smoking Status Former  ? Types: Cigars  ?Smokeless Tobacco Never  ? ? ?Goals Met:  ?Independence with exercise equipment ?Exercise tolerated well ?No report of concerns or symptoms today ?Strength training completed today ? ?Goals Unmet:  ?Not Applicable ? ?Comments: Check out 1545. ? ? ?Dr. Carlyle Dolly is Medical Director for [place] ?

## 2021-06-29 ENCOUNTER — Encounter (HOSPITAL_COMMUNITY): Payer: Medicare HMO

## 2021-06-29 ENCOUNTER — Encounter (HOSPITAL_COMMUNITY)
Admission: RE | Admit: 2021-06-29 | Discharge: 2021-06-29 | Disposition: A | Discharge status: Home / Self Care | Payer: Medicare HMO | Source: Ambulatory Visit | Attending: Thoracic Surgery (Cardiothoracic Vascular Surgery) | Admitting: Thoracic Surgery (Cardiothoracic Vascular Surgery)

## 2021-06-29 DIAGNOSIS — Z951 Presence of aortocoronary bypass graft: Secondary | ICD-10-CM

## 2021-06-29 DIAGNOSIS — Z952 Presence of prosthetic heart valve: Secondary | ICD-10-CM

## 2021-06-29 LAB — BASIC METABOLIC PANEL
BUN/Creatinine Ratio: 16 (ref 10–24)
BUN: 24 mg/dL (ref 8–27)
CO2: 22 mmol/L (ref 20–29)
Calcium: 8.9 mg/dL (ref 8.6–10.2)
Chloride: 101 mmol/L (ref 96–106)
Creatinine, Ser: 1.49 mg/dL — ABNORMAL HIGH (ref 0.76–1.27)
Glucose: 101 mg/dL — ABNORMAL HIGH (ref 70–99)
Potassium: 4.9 mmol/L (ref 3.5–5.2)
Sodium: 137 mmol/L (ref 134–144)
eGFR: 47 mL/min/{1.73_m2} — ABNORMAL LOW (ref >59)

## 2021-06-29 LAB — PRO B NATRIURETIC PEPTIDE: NT-Pro BNP: 464 pg/mL (ref 0–486)

## 2021-06-29 LAB — MAGNESIUM: Magnesium: 2.8 mg/dL — ABNORMAL HIGH (ref 1.6–2.3)

## 2021-06-29 NOTE — Progress Notes (Signed)
Daily Session Note ? ?Patient Details  ?Name: Keith Wagner ?MRN: [id number] ?Date of Birth: 1940-07-11 ?Referring Provider:   ?Flowsheet Row CARDIAC REHAB PHASE II ORIENTATION from 05/01/2021 in [place]  ?Referring Provider Dr. Roxan Hockey  ? ?  ? ? ?Encounter Date: 06/29/2021 ? ?Check In: ? Session Check In - 06/29/21 1444   ? ?  ? Check-In  ? Supervising physician immediately available to respond to emergencies [facility] MD immediately available   ? Physician(s) Dr Harrington Challenger   ? Location [facility]-Card[facility] & Pulmonary Rehab   ? Staff Present Geanie Cooley, RN;Heather Otho Ket, BS, Exercise Physiologist;Johnta Couts Hassell Done, RN, BSN   ? Virtual Visit No   ? Medication changes reported     No   ? Fall or balance concerns reported    No   ? Tobacco Cessation No Change   ? Warm-up and Cool-down Performed as group-led instruction   ? Resistance Training Performed Yes   ? VAD Patient? No   ? PAD/SET Patient? No   ?  ? Pain Assessment  ? Currently in Pain? No/denies   ? Pain Score 0-No pain   ? Multiple Pain Sites No   ? ?  ?  ? ?  ? ? ?Capillary Blood Glucose: ?No results found for this or any previous visit (from the past 24 hour(s)). ? ? ? ?Social History  ? ?Tobacco Use  ?Smoking Status Former  ? Types: Cigars  ?Smokeless Tobacco Never  ? ? ?Goals Met:  ?Independence with exercise equipment ?Personal goals reviewed ?No report of concerns or symptoms today ?Strength training completed today ? ?Goals Unmet:  ?Not Applicable ? ?Comments: Check out at 1545. ? ? ?Dr. Carlyle Dolly is Medical Director for [place] ?

## 2021-07-02 ENCOUNTER — Other Ambulatory Visit: Payer: Self-pay | Admitting: Surgical

## 2021-07-02 ENCOUNTER — Encounter (HOSPITAL_COMMUNITY)
Admission: RE | Admit: 2021-07-02 | Discharge: 2021-07-02 | Disposition: A | Discharge status: Home / Self Care | Payer: Medicare HMO | Source: Ambulatory Visit | Attending: Thoracic Surgery (Cardiothoracic Vascular Surgery) | Admitting: Thoracic Surgery (Cardiothoracic Vascular Surgery)

## 2021-07-02 ENCOUNTER — Encounter (HOSPITAL_COMMUNITY): Payer: Medicare HMO

## 2021-07-02 VITALS — Wt 166.9 lb

## 2021-07-02 DIAGNOSIS — Z951 Presence of aortocoronary bypass graft: Secondary | ICD-10-CM

## 2021-07-02 DIAGNOSIS — Z952 Presence of prosthetic heart valve: Secondary | ICD-10-CM

## 2021-07-02 DIAGNOSIS — Z79899 Other long term (current) drug therapy: Secondary | ICD-10-CM

## 2021-07-02 NOTE — Progress Notes (Signed)
Daily Session Note ? ?Patient Details  ?Name: Keith Wagner ?MRN: [id number] ?Date of Birth: [birth year] ?Referring Provider:   ?Flowsheet Row CARDIAC REHAB PHASE II ORIENTATION from 05/01/2021 in [place]  ?Referring Provider Dr. Roxan Hockey  ? ?  ? ? ?Encounter Date: 07/02/2021 ? ?Check In: ? Session Check In - 07/02/21 1443   ? ?  ? Check-In  ? Supervising physician immediately available to respond to emergencies [facility] MD immediately available   ? Physician(s) Dr Johnsie Cancel   ? Location [facility]-Cardiac & Pulmonary Rehab   ? Staff Present Geanie Cooley, RN;Heather Otho Ket, BS, Exercise Physiologist;Dalton Kris Mouton, MS, ACSM-CEP, Exercise Physiologist;Cristofer Yaffe Hassell Done, RN, BSN   ? Virtual Visit No   ? Medication changes reported     No   ? Fall or balance concerns reported    No   ? Tobacco Cessation No Change   ? Warm-up and Cool-down Performed as group-led instruction   ? Resistance Training Performed Yes   ? VAD Patient? No   ? PAD/SET Patient? No   ?  ? Pain Assessment  ? Currently in Pain? No/denies   ? Pain Score 0-No pain   ? Multiple Pain Sites No   ? ?  ?  ? ?  ? ? ?Capillary Blood Glucose: ?No results found for this or any previous visit (from the past 24 hour(s)). ? ? ? ?Social History  ? ?Tobacco Use  ?Smoking Status Former  ? Types: Cigars  ?Smokeless Tobacco Never  ? ? ?Goals Met:  ?Independence with exercise equipment ?Exercise tolerated well ?No report of concerns or symptoms today ?Strength training completed today ? ?Goals Unmet:  ?Not Applicable ? ?Comments: Check out 1545. ? ? ?Dr. Carlyle Dolly is Medical Director for [place] ?

## 2021-07-02 NOTE — Telephone Encounter (Signed)
Called and reviewed recent lab results with pt and pt's wife. Renal function and hyperkalemia concerns improved since discontinuing potassium supplements and decrease spironolactone dose from 25 mg to 12.5 mg. Home BP continues to remain stable at 119/75. Recommended pt continue current therapy of spironolactone 12.5 mg, Entresto 97/103 mg BID, farxiga 10 mg, metoprolol 25 mg. Pt reports to continue to do well without any complains of lightheadedness, dizziness, fatigue, SOB, dyspnea, edema concerns.  ?

## 2021-07-04 ENCOUNTER — Other Ambulatory Visit: Payer: Self-pay

## 2021-07-04 ENCOUNTER — Telehealth: Payer: Self-pay

## 2021-07-04 ENCOUNTER — Telehealth: Payer: Self-pay | Admitting: Cardiology

## 2021-07-04 ENCOUNTER — Encounter (HOSPITAL_COMMUNITY): Payer: Medicare HMO

## 2021-07-04 ENCOUNTER — Other Ambulatory Visit: Payer: Self-pay | Admitting: Surgical

## 2021-07-04 ENCOUNTER — Encounter (HOSPITAL_COMMUNITY)
Admission: RE | Admit: 2021-07-04 | Discharge: 2021-07-04 | Disposition: A | Discharge status: Home / Self Care | Payer: Medicare HMO | Source: Ambulatory Visit | Attending: Thoracic Surgery (Cardiothoracic Vascular Surgery) | Admitting: Thoracic Surgery (Cardiothoracic Vascular Surgery)

## 2021-07-04 DIAGNOSIS — Z952 Presence of prosthetic heart valve: Secondary | ICD-10-CM

## 2021-07-04 DIAGNOSIS — Z951 Presence of aortocoronary bypass graft: Secondary | ICD-10-CM

## 2021-07-04 NOTE — Progress Notes (Signed)
Daily Session Note ? ?Patient Details  ?Name: Keith Wagner ?MRN: [id number] ?Date of Birth: December 13, 1940 ?Referring Provider:   ?Flowsheet Row CARDIAC REHAB PHASE II ORIENTATION from 05/01/2021 in [place]  ?Referring Provider Dr. Roxan Hockey  ? ?  ? ? ?Encounter Date: 07/04/2021 ? ?Check In: ? Session Check In - 07/04/21 1439   ? ?  ? Check-In  ? Supervising physician immediately available to respond to emergencies [facility] MD immediately available   ? Physician(s) Dr Gasper Sells   ? Location [facility]   ? Staff Present Geanie Cooley, RN;Heather Otho Ket, [place], Exercise Physiologist;Anagha Loseke Hassell Done, RN, Bjorn Loser, MS, ACSM-CEP, Exercise Physiologist   ? Virtual Visit No   ? Medication changes reported     No   ? Fall or balance concerns reported    No   ? Tobacco Cessation No Change   ? Warm-up and Cool-down Performed as group-led instruction   ? Resistance Training Performed Yes   ? VAD Patient? No   ? PAD/SET Patient? No   ?  ? Pain Assessment  ? Currently in Pain? No/denies   ? Pain Score 0-No pain   ? Multiple Pain Sites No   ? ?  ?  ? ?  ? ? ?Capillary Blood Glucose: ?No results found for this or any previous visit (from the past 24 hour(s)). ? ? ? ?Social History  ? ?Tobacco Use  ?Smoking Status Former  ? Types: Cigars  ?Smokeless Tobacco Never  ? ? ?Goals Met:  ?Independence with exercise equipment ?Exercise tolerated well ?No report of concerns or symptoms today ?Strength training completed today ? ?Goals Unmet:  ?Not Applicable ? ?Comments: Check out at 1545. ? ? ?Dr. Carlyle Dolly is Medical Director for [place] ?

## 2021-07-04 NOTE — Telephone Encounter (Signed)
Patient's wife calling, needs refill on a couple medications. Please call back at [phone] or can also call [phone]. ?

## 2021-07-04 NOTE — Telephone Encounter (Signed)
Called and spoke to patient message has been sent to patient's provider regarding medications

## 2021-07-06 ENCOUNTER — Encounter (HOSPITAL_COMMUNITY): Payer: Medicare HMO

## 2021-07-06 ENCOUNTER — Encounter (HOSPITAL_COMMUNITY)
Admission: RE | Admit: 2021-07-06 | Discharge: 2021-07-06 | Disposition: A | Discharge status: Home / Self Care | Payer: Medicare HMO | Source: Ambulatory Visit | Attending: Thoracic Surgery (Cardiothoracic Vascular Surgery) | Admitting: Thoracic Surgery (Cardiothoracic Vascular Surgery)

## 2021-07-06 DIAGNOSIS — Z951 Presence of aortocoronary bypass graft: Secondary | ICD-10-CM

## 2021-07-06 DIAGNOSIS — Z952 Presence of prosthetic heart valve: Secondary | ICD-10-CM

## 2021-07-06 NOTE — Progress Notes (Signed)
Daily Session Note ? ?Patient Details  ?Name: Keith Wagner ?MRN: [id number] ?Date of Birth: [birth year] ?Referring Provider:   ?Flowsheet Row CARDIAC REHAB PHASE II ORIENTATION from 05/01/2021 in [place]  ?Referring Provider Dr. Roxan Hockey  ? ?  ? ? ?Encounter Date: 07/06/2021 ? ?Check In: ? Session Check In - 07/06/21 1445   ? ?  ? Check-In  ? Supervising physician immediately available to respond to emergencies [facility] MD immediately available   ? Physician(s) Nish[place]   ? Location [facility] Pulmonary Re[facility]   ? Staff Present Aundra Dubin, RN, Bjorn Loser, MS, ACSM-CEP, Exercise Physiologist;Heather Zigmund Daniel, Exercise Physiologist   ? Virtual Visit No   ? Medication changes reported     No   ? Fall or balance concerns reported    No   ? Tobacco Cessation No Change   ? Warm-up and Cool-down Performed as group-led instruction   ? Resistance Training Performed Yes   ? VAD Patient? No   ? PAD/SET Patient? No   ?  ? Pain Assessment  ? Currently in Pain? No/denies   ? Pain Score 0-No pain   ? Multiple Pain Sites No   ? ?  ?  ? ?  ? ? ?Capillary Blood Glucose: ?No results found for this or any previous visit (from the past 24 hour(s)). ? ? ? ?Social History  ? ?Tobacco Use  ?Smoking Status Former  ? Types: Cigars  ?Smokeless Tobacco Never  ? ? ?Goals Met:  ?Independence with exercise equipment ?Exercise tolerated well ?No report of concerns or symptoms today ?Strength training completed today ? ?Goals Unmet:  ?Not Applicable ? ?Comments: Check out 1545. ? ? ?Dr. Carlyle Dolly is Medical Director for [place] ?

## 2021-07-09 ENCOUNTER — Encounter (HOSPITAL_COMMUNITY): Payer: Medicare HMO

## 2021-07-09 ENCOUNTER — Other Ambulatory Visit: Payer: Self-pay | Admitting: Surgical

## 2021-07-09 ENCOUNTER — Encounter (HOSPITAL_COMMUNITY)
Admission: RE | Admit: 2021-07-09 | Discharge: 2021-07-09 | Disposition: A | Discharge status: Home / Self Care | Payer: Medicare HMO | Source: Ambulatory Visit | Attending: Thoracic Surgery (Cardiothoracic Vascular Surgery) | Admitting: Thoracic Surgery (Cardiothoracic Vascular Surgery)

## 2021-07-09 DIAGNOSIS — Z952 Presence of prosthetic heart valve: Secondary | ICD-10-CM

## 2021-07-09 DIAGNOSIS — Z951 Presence of aortocoronary bypass graft: Secondary | ICD-10-CM | POA: Diagnosis not present

## 2021-07-09 DIAGNOSIS — Z79899 Other long term (current) drug therapy: Secondary | ICD-10-CM

## 2021-07-09 NOTE — Progress Notes (Signed)
Daily Session Note ? ?Patient Details  ?Name: Keith Wagner ?MRN: [id number] ?Date of Birth: 22-Jun-1940 ?Referring Provider:   ?Flowsheet Row CARDIAC REHAB PHASE II ORIENTATION from 05/01/2021 in [place]  ?Referring Provider Dr. Roxan Hockey  ? ?  ? ? ?Encounter Date: 07/09/2021 ? ?Check In: ? Session Check In - 07/09/21 1433   ? ?  ? Check-In  ? Supervising physician immediately available to respond to emergencies [facility] MD immediately available   ? Physician(s) Dr Johnsie Cancel   ? Location [facility]   ? Staff Present Geanie Cooley, RN;Raj Landress Hassell Done, RN, BSN;Heather Otho Ket, BS, Exercise Physiologist   ? Virtual Visit No   ? Medication changes reported     No   ? Fall or balance concerns reported    No   ? Tobacco Cessation No Change   ? Warm-up and Cool-down Performed as group-led instruction   ? Resistance Training Performed Yes   ? VAD Patient? No   ? PAD/SET Patient? No   ?  ? Pain Assessment  ? Currently in Pain? No/denies   ? Pain Score 0-No pain   ? Multiple Pain Sites No   ? ?  ?  ? ?  ? ? ?Capillary Blood Glucose: ?No results found for this or any previous visit (from the past 24 hour(s)). ? ? ? ?Social History  ? ?Tobacco Use  ?Smoking Status Former  ? Types: Cigars  ?Smokeless Tobacco Never  ? ? ?Goals Met:  ?Independence with exercise equipment ?Exercise tolerated well ?No report of concerns or symptoms today ?Strength training completed today ? ?Goals Unmet:  ?Not Applicable ? ?Comments: Check out at 1545. ? ? ?Dr. Carlyle Dolly is Medical Director for [place] ?

## 2021-07-10 ENCOUNTER — Other Ambulatory Visit: Payer: Self-pay | Admitting: Surgical

## 2021-07-10 ENCOUNTER — Telehealth: Payer: Self-pay

## 2021-07-10 ENCOUNTER — Other Ambulatory Visit: Payer: Self-pay

## 2021-07-10 NOTE — Telephone Encounter (Signed)
You can refill both.  ? ?Thanks.  ? ?ST

## 2021-07-11 ENCOUNTER — Encounter (HOSPITAL_COMMUNITY)
Admission: RE | Admit: 2021-07-11 | Discharge: 2021-07-11 | Disposition: A | Discharge status: Home / Self Care | Payer: Medicare HMO | Source: Ambulatory Visit | Attending: Thoracic Surgery (Cardiothoracic Vascular Surgery) | Admitting: Thoracic Surgery (Cardiothoracic Vascular Surgery)

## 2021-07-11 ENCOUNTER — Encounter (HOSPITAL_COMMUNITY): Payer: Medicare HMO

## 2021-07-11 ENCOUNTER — Other Ambulatory Visit: Payer: Self-pay

## 2021-07-11 DIAGNOSIS — Z951 Presence of aortocoronary bypass graft: Secondary | ICD-10-CM | POA: Diagnosis not present

## 2021-07-11 DIAGNOSIS — Z952 Presence of prosthetic heart valve: Secondary | ICD-10-CM

## 2021-07-11 DIAGNOSIS — Z79899 Other long term (current) drug therapy: Secondary | ICD-10-CM

## 2021-07-11 MED ORDER — APIXABAN 5 MG PO TABS: 5.0000 mg | ORAL_TABLET | Freq: Two times a day (BID) | ORAL | 3 refills | Status: DC

## 2021-07-11 MED ORDER — AMIODARONE HCL 200 MG PO TABS: 200.0000 mg | ORAL_TABLET | Freq: Every day | ORAL | Status: DC

## 2021-07-11 NOTE — Telephone Encounter (Signed)
Medication has been refilled.

## 2021-07-11 NOTE — Progress Notes (Signed)
Daily Session Note ? ?Patient Details  ?Name: ARK AGRUSA ?MRN: [id number] ?Date of Birth: 05-Apr-1940 ?Referring Provider:   ?Flowsheet Row CARDIAC REHAB PHASE II ORIENTATION from 05/01/2021 in [place]  ?Referring Provider Dr. Roxan Hockey  ? ?  ? ? ?Encounter Date: 07/11/2021 ? ?Check In: ? Session Check In - 07/11/21 1440   ? ?  ? Check-In  ? Supervising physician immediately available to respond to emergencies [facility] MD immediately available   ? Physician(s) Dr Harl Bowie   ? Location [facility]   ? Staff Present Madelyn Flavors, RN, Bjorn Loser, MS, ACSM-CEP, Exercise Physiologist;Heather Zigmund Daniel, Exercise Physiologist   ? Virtual Visit No   ? Medication changes reported     No   ? Fall or balance concerns reported    No   ? Tobacco Cessation No Change   ? Warm-up and Cool-down Performed as group-led instruction   ? Resistance Training Performed Yes   ? VAD Patient? No   ? PAD/SET Patient? No   ?  ? Pain Assessment  ? Currently in Pain? No/denies   ? Pain Score 0-No pain   ? Multiple Pain Sites No   ? ?  ?  ? ?  ? ? ?Capillary Blood Glucose: ?No results found for this or any previous visit (from the past 24 hour(s)). ? ? ? ?Social History  ? ?Tobacco Use  ?Smoking Status Former  ? Types: Cigars  ?Smokeless Tobacco Never  ? ? ?Goals Met:  ?Independence with exercise equipment ?Personal goals reviewed ?No report of concerns or symptoms today ?Strength training completed today ? ?Goals Unmet:  ?Not Applicable ? ?Comments: Check out at 1545. ? ? ?Dr. Carlyle Dolly is Medical Director for [place] ?

## 2021-07-13 ENCOUNTER — Encounter (HOSPITAL_COMMUNITY): Payer: Medicare HMO

## 2021-07-13 ENCOUNTER — Encounter (HOSPITAL_COMMUNITY)
Admission: RE | Admit: 2021-07-13 | Discharge: 2021-07-13 | Disposition: A | Discharge status: Home / Self Care | Payer: Medicare HMO | Source: Ambulatory Visit | Attending: Thoracic Surgery (Cardiothoracic Vascular Surgery) | Admitting: Thoracic Surgery (Cardiothoracic Vascular Surgery)

## 2021-07-13 DIAGNOSIS — Z951 Presence of aortocoronary bypass graft: Secondary | ICD-10-CM | POA: Diagnosis not present

## 2021-07-13 DIAGNOSIS — Z952 Presence of prosthetic heart valve: Secondary | ICD-10-CM

## 2021-07-13 NOTE — Progress Notes (Signed)
Daily Session Note ? ?Patient Details  ?Name: Keith Wagner ?MRN: [id number] ?Date of Birth: [birth year] ?Referring Provider:   ?Flowsheet Row CARDIAC REHAB PHASE II ORIENTATION from 05/01/2021 in [place]  ?Referring Provider Dr. Roxan Hockey  ? ?  ? ? ?Encounter Date: 07/13/2021 ? ?Check In: ? Session Check In - 07/13/21 1438   ? ?  ? Check-In  ? Supervising physician immediately available to respond to emergencies [facility] MD immediately available   ? Physician(s) Dr Harrington Challenger   ? Location [facility]   ? Staff Present Madelyn Flavors, RN, BSN;Christy Edwards, RN, BSN;Heather Otho Ket, BS, Exercise Physiologist   ? Virtual Visit No   ? Medication changes reported     No   ? Fall or balance concerns reported    No   ? Tobacco Cessation No Change   ? Warm-up and Cool-down Performed as group-led instruction   ? Resistance Training Performed Yes   ? VAD Patient? No   ? PAD/SET Patient? No   ?  ? Pain Assessment  ? Currently in Pain? No/denies   ? Pain Score 0-No pain   ? Multiple Pain Sites No   ? ?  ?  ? ?  ? ? ?Capillary Blood Glucose: ?No results found for this or any previous visit (from the past 24 hour(s)). ? ? ? ?Social History  ? ?Tobacco Use  ?Smoking Status Former  ? Types: Cigars  ?Smokeless Tobacco Never  ? ? ?Goals Met:  ?Independence with exercise equipment ?Exercise tolerated well ?No report of concerns or symptoms today ?Strength training completed today ? ?Goals Unmet:  ?Not Applicable ? ?Comments: checkout at 1545. ? ? ?Dr. Carlyle Dolly is Medical Director for [place] ?

## 2021-07-16 ENCOUNTER — Encounter (HOSPITAL_COMMUNITY)
Admission: RE | Admit: 2021-07-16 | Discharge: 2021-07-16 | Disposition: A | Discharge status: Home / Self Care | Payer: Medicare HMO | Source: Ambulatory Visit | Attending: Thoracic Surgery (Cardiothoracic Vascular Surgery) | Admitting: Thoracic Surgery (Cardiothoracic Vascular Surgery)

## 2021-07-16 ENCOUNTER — Encounter (HOSPITAL_COMMUNITY): Payer: Medicare HMO

## 2021-07-16 VITALS — Wt 165.8 lb

## 2021-07-16 DIAGNOSIS — Z952 Presence of prosthetic heart valve: Secondary | ICD-10-CM

## 2021-07-16 DIAGNOSIS — Z951 Presence of aortocoronary bypass graft: Secondary | ICD-10-CM

## 2021-07-16 NOTE — Progress Notes (Signed)
Daily Session Note ? ?Patient Details  ?Name: Keith Wagner ?MRN: [id number] ?Date of Birth: 01/23/1941 ?Referring Provider:   ?Flowsheet Row CARDIAC REHAB PHASE II ORIENTATION from 05/01/2021 in [place] C[facility]IL[facility]  ?Referring Provider Dr. Hendrickson  ? ?  ? ? ?Encounter Date: 07/16/2021 ? ?Check In: ? Session Check In - 07/16/21 1441   ? ?  ? Check-In  ? Supervising physician immediately available to respond to emergencies [facility] MD immediately available   ? Physician(s) Dr Chandrasekhar   ? Location [facility]   ? Staff Present Daphyne Martin, RN, BSN;Dalton Fletcher, MS, ACSM-CEP, Exercise Physiologist;Heather Jachimiak, BS, Exercise Physiologist   ? Virtual Visit No   ? Medication changes reported     No   ? Fall or balance concerns reported    No   ? Tobacco Cessation No Change   ? Warm-up and Cool-down Performed as group-led instruction   ? Resistance Training Performed Yes   ? VAD Patient? No   ? PAD/SET Patient? No   ?  ? Pain Assessment  ? Currently in Pain? No/denies   ? Pain Score 0-No pain   ? Multiple Pain Sites No   ? ?  ?  ? ?  ? ? ?Capillary Blood Glucose: ?No results found for this or any previous visit (from the past 24 hour(s)). ? ? ? ?Social History  ? ?Tobacco Use  ?Smoking Status Former  ? Types: Cigars  ?Smokeless Tobacco Never  ? ? ?Goals Met:  ?Independence with exercise equipment ?Exercise tolerated well ?No report of concerns or symptoms today ?Strength training completed today ? ?Goals Unmet:  ?Not Applicable ? ?Comments: Check out at 1545. ? ? ?Dr. Jonathan Branch is Medical Director for [place] [facility] ?

## 2021-07-18 ENCOUNTER — Encounter (HOSPITAL_COMMUNITY): Payer: Medicare HMO

## 2021-07-18 ENCOUNTER — Encounter (HOSPITAL_COMMUNITY)
Admission: RE | Admit: 2021-07-18 | Discharge: 2021-07-18 | Disposition: A | Discharge status: Home / Self Care | Payer: Medicare HMO | Source: Ambulatory Visit | Attending: Thoracic Surgery (Cardiothoracic Vascular Surgery) | Admitting: Thoracic Surgery (Cardiothoracic Vascular Surgery)

## 2021-07-18 DIAGNOSIS — Z951 Presence of aortocoronary bypass graft: Secondary | ICD-10-CM | POA: Diagnosis not present

## 2021-07-18 DIAGNOSIS — Z952 Presence of prosthetic heart valve: Secondary | ICD-10-CM

## 2021-07-18 NOTE — Progress Notes (Signed)
Cardiac Individual Treatment Plan ? ?Patient Details  ?Name: Keith Wagner ?MRN: [id number] ?Date of Birth: [birth year] ?Referring Provider:   ?Flowsheet Row CARDIAC REHAB PHASE II ORIENTATION from 05/01/2021 in [place]  ?Referring Provider Dr. Roxan Hockey  ? ?  ? ? ?Initial Encounter Date:  ?Flowsheet Row CARDIAC REHAB PHASE II ORIENTATION from 05/01/2021 in [place]  ?Date 05/01/21  ? ?  ? ? ?Visit Diagnosis: S/P CABG x 2 ? ?S/P AVR (aortic valve replacement) ? ?Patient's Home Medications on Admission: ? ?Current Outpatient Medications:  ?  acetaminophen (TYLENOL) 500 MG tablet, Take 1,000 mg by mouth every 6 (six) hours as needed for moderate pain., Disp: , Rfl:  ?  amiodarone (PACERONE) 200 MG tablet, Take 1 tablet (200 mg total) by mouth daily., Disp: , Rfl:  ?  apixaban (ELIQUIS) 5 MG TABS tablet, Take 1 tablet (5 mg total) by mouth 2 (two) times daily., Disp: 60 tablet, Rfl: 3 ?  aspirin 81 MG tablet, Take 81 mg by mouth daily., Disp: , Rfl:  ?  atorvastatin (LIPITOR) 40 MG tablet, Take 1 tablet (40 mg total) by mouth daily., Disp: 30 tablet, Rfl: 3 ?  calcium carbonate (TUMS - DOSED IN MG ELEMENTAL CALCIUM) 500 MG chewable tablet, Chew 500-1,000 mg by mouth daily as needed for indigestion or heartburn., Disp: , Rfl:  ?  clobetasol cream (TEMOVATE) 2.58 %, Apply 1 application topically 2 (two) times daily as needed (eczema)., Disp: , Rfl:  ?  dapagliflozin propanediol (FARXIGA) 10 MG TABS tablet, Take by mouth daily., Disp: , Rfl:  ?  feeding supplement (ENSURE ENLIVE / ENSURE PLUS) LIQD, Take 237 mLs by mouth 2 (two) times daily between meals., Disp: 237 mL, Rfl: 12 ?  Lutein 6 MG TABS, Take 6 mg by mouth daily., Disp: , Rfl:  ?  magnesium oxide (MAG-OX) 400 MG tablet, Take 1 tablet (400 mg total) by mouth daily., Disp: 30 tablet, Rfl: 11 ?  metoprolol succinate (TOPROL-XL) 25 MG 24 hr tablet, TAKE 1 TABLET BY MOUTH ONCE DAILY. HOLD IF TOP BLOOD PRESSURE NUMBER  IS LESS THAN 100 MMHG OR PILSE LES THAN 60 BPM (Patient taking differently: Take 25 mg by mouth daily. HOLD IF TOP BLOOD PRESSURE NUMBER IS LESS THAN 100 MMHG OR PILSE LES THAN 60 BPM), Disp: 30 tablet, Rfl: 3 ?  Multiple Vitamins-Minerals (MULTIVITAMIN PO), Take 1 tablet by mouth daily., Disp: , Rfl:  ?  Omega-3 Fatty Acids (FISH OIL) 1000 MG CAPS, Take 1,000 mg by mouth daily., Disp: , Rfl:  ?  OVER THE COUNTER MEDICATION, Take 1 tablet by mouth daily. Super Beta Prostate Advanced, Disp: , Rfl:  ?  sacubitril-valsartan (ENTRESTO) 97-103 MG, Take 1 tablet by mouth 2 (two) times daily., Disp: 180 tablet, Rfl: 0 ?  spironolactone (ALDACTONE) 25 MG tablet, Take 1 tablet (25 mg total) by mouth daily. (Patient taking differently: Take 12.5 mg by mouth daily.), Disp: 30 tablet, Rfl: 1 ?  triamcinolone cream (KENALOG) 0.1 %, Apply 1 application topically 2 (two) times daily as needed (eczema)., Disp: , Rfl:  ? ?Past Medical History: ?Past Medical History:  ?Diagnosis Date  ? Arthritis   ? Asthma   ? uses inhaler PRN  ? Bilateral carotid bruits 06/12/2018  ? Cataract   ? Diabetes mellitus without complication ([place])   ? per pt "pre-diabetic"- off Metformin- diet controlled   ? GERD (gastroesophageal reflux disease)   ? Gout   ? Heart murmur   ?  Hypercholesteremia 06/12/2018  ? Hyperlipidemia   ? Hypertension   ? Mild aortic stenosis 06/12/2018  ? ? ?Tobacco Use: ?Social History  ? ?Tobacco Use  ?Smoking Status Former  ? Types: Cigars  ?Smokeless Tobacco Never  ? ? ?Labs: ?Review Flowsheet   ? ?  ?  Latest Ref Rng & Units 02/06/2021 02/27/2021 03/01/2021 03/03/2021  ?Labs for ITP Cardiac and Pulmonary Rehab  ?Hemoglobin A1c 4.8 - 5.6 %  6.3      ?PH, Arterial 7.350 - 7.450 7.380   7.397   7.364    ? 7.318    ? 7.352    ? 7.322    ? 7.334    ? 7.400    ? 7.432    ? 7.447    ? 7.452    ? 7.444   7.419    ? 7.362    ? 7.360    ?PCO2 arterial 32.0 - 48.0 mmHg 35.7   35.4   36.2    ? 37.3    ? 36.4    ? 38.3    ? 41.2    ? 40.0    ?  36.5    ? 35.3    ? 35.6    ? 34.4   34.7    ? 40.0    ? 28.7    ?Bicarbonate 20.0 - 28.0 mmol/L 21.1    ? 20.9   21.3   20.7    ? 19.2    ? 20.3    ? 19.9    ? 21.9    ? 24.8    ? 24.3    ? 24.4    ? 24.9    ? 22.4    ? 23.6   22.6    ? 22.7    ? 16.2    ?TCO2 22 - 32 mmol/L 22    ? 22    22    ? 20    ? 21    ? 21    ? 22    ? 23    ? 26    ? 24    ? 25    ? 25    ? 26    ? 26    ? 23    ? 25    ? 22    ? 24   24    ? 24    ? 17    ?Acid-base deficit 0.0 - 2.0 mmol/L 3.0    ? 5.0   2.8   4.0    ? 6.0    ? 5.0    ? 6.0    ? 4.0    ? 2.0   2.0    ? 3.0    ? 8.0    ?O2 Saturation % 97.0    ? 68.0   96.9   95.0    ? 98.0    ? 99.0    ? 100.0    ? 100.0    ? 100.0    ? 100.0    ? 100.0    ? 100.0    ? 80.0    ? 100.0   97.0    ? 59.0    ? 78.0    ? 52.3    ? ?  03/04/2021  ?Labs for ITP Cardiac and Pulmonary Rehab  ?Hemoglobin A1c   ?PH, Arterial   ?PCO2 arterial   ?Bicarbonate   ?TCO2   ?Acid-base deficit   ?  O2 Saturation 62.9    ?  ? ? Multiple values from one day are sorted in reverse-chronological order  ?  ?  ? ? ?Capillary Blood Glucose: ?Lab Results  ?Component Value Date  ? GLUCAP 152 (H) 05/11/2021  ? GLUCAP 118 (H) 05/09/2021  ? GLUCAP 106 (H) 05/01/2021  ? GLUCAP 128 (H) 03/11/2021  ? GLUCAP 108 (H) 03/11/2021  ? ? ? ?Exercise Target Goals: ?Exercise Program Goal: ?Individual exercise prescription set using results from initial 6 min walk test and THRR while considering  patient?s activity barriers and safety.  ? ?Exercise Prescription Goal: ?Starting with aerobic activity 30 plus minutes a day, 3 days per week for initial exercise prescription. Provide home exercise prescription and guidelines that participant acknowledges understanding prior to discharge. ? ?Activity Barriers & Risk Stratification: ? Activity Barriers & Cardiac Risk Stratification - 05/01/21 0825   ? ?  ? Activity Barriers & Cardiac Risk Stratification  ? Activity Barriers None   ? Cardiac Risk Stratification High   ? ?  ?  ? ?  ? ? ?6  Minute Walk: ? 6 Minute Walk   ? ? [place] Name 05/01/21 1013  ?  ?  ?  ? 6 Minute Walk  ? Phase Initial    ? Distance 1000 feet    ? Walk Time 6 minutes    ? # of Rest Breaks 1    ? MPH 1.9    ? METS 1.84    ? RPE 12    ? VO2 Peak 6.42    ? Symptoms Yes (comment)    ? Comments one standing rest break 15 sec    ? Resting HR 62 bpm    ? Resting BP 116/60    ? Resting Oxygen Saturation  97 %    ? Exercise Oxygen Saturation  during 6 min walk 98 %    ? Max Ex. HR 90 bpm    ? Max Ex. BP 128/65    ? 2 Minute Post BP 118/63    ? ?  ?  ? ?  ? ? ?Oxygen Initial Assessment: ? ? ?Oxygen Re-Evaluation: ? ? ?Oxygen Discharge (Final Oxygen Re-Evaluation): ? ? ?Initial Exercise Prescription: ? Initial Exercise Prescription - 05/01/21 1000   ? ?  ? Date of Initial Exercise RX and Referring Provider  ? Date 05/01/21   ? Referring Provider Dr. Roxan Hockey   ? Expected Discharge Date 07/20/21   ?  ? Treadmill  ? MPH 1.4   ? Grade 0   ? Minutes 17   ?  ? NuStep  ? Level 1   ? SPM 60   ? Minutes 22   ?  ? Prescription Details  ? Frequency (times per week) 3   ? Duration Progress to 30 minutes of continuous aerobic without signs/symptoms of physical distress   ?  ? Intensity  ? THRR 40-80% of Max Heartrate 56-113   ? Ratings of Perceived Exertion 11-13   ? Perceived Dyspnea 0-4   ?  ? Resistance Training  ? Training Prescription Yes   ? Weight 3   ? Reps 10-15   ? ?  ?  ? ?  ? ? ?Perform Capillary Blood Glucose checks as needed. ? ?Exercise Prescription Changes: ? ? Exercise Prescription Changes   ? ? [place] Name 05/07/21 1543 05/21/21 1540 06/04/21 1554 06/18/21 1554 06/20/21 1500  ?  ? Response to Exercise  ? Blood Pressure (Admit) 110/58 120/70 138/70 122/66 --  ?  Blood Pressure (Exercise) 140/82 128/62 130/80 106/60 --  ? Blood Pressure (Exit) 120/80 130/80 130/70 100/70 --  ? Heart Rate (Admit) 65 bpm 69 bpm 63 bpm 59 bpm --  ? Heart Rate (Exercise) 106 bpm 109 bpm 96 bpm 110 bpm --  ? Heart Rate (Exit) 83 bpm 77 bpm 77 bpm 68 bpm --  ?  Rating of Perceived Exertion (Exercise) '12 11 11 12 '$ --  ? Duration Continue with 30 min of aerobic exercise without signs/symptoms of physical distress. Continue with 30 min of aerobic exercise without sig

## 2021-07-18 NOTE — Progress Notes (Signed)
Daily Session Note ? ?Patient Details  ?Name: Keith Wagner ?MRN: [id number] ?Date of Birth: Oct 14, 1940 ?Referring Provider:   ?Flowsheet Row CARDIAC REHAB PHASE II ORIENTATION from 05/01/2021 in [place]  ?Referring Provider Dr. Roxan Hockey  ? ?  ? ? ?Encounter Date: 07/18/2021 ? ?Check In: ? Session Check In - 07/18/21 1438   ? ?  ? Check-In  ? Supervising physician immediately available to respond to emergencies [facility] MD immediately available   ? Physician(s) Dr Domenic Polite   ? Location [facility]   ? Staff Present Aundra Dubin, RN, Joanette Gula, RN, BSN;Heather Otho Ket, BS, Exercise Physiologist   ? Virtual Visit No   ? Medication changes reported     No   ? Fall or balance concerns reported    No   ? Tobacco Cessation No Change   ? Warm-up and Cool-down Performed as group-led instruction   ? Resistance Training Performed Yes   ? VAD Patient? No   ? PAD/SET Patient? No   ?  ? Pain Assessment  ? Currently in Pain? No/denies   ? Pain Score 0-No pain   ? Multiple Pain Sites No   ? ?  ?  ? ?  ? ? ?Capillary Blood Glucose: ?No results found for this or any previous visit (from the past 24 hour(s)). ? ? ? ?Social History  ? ?Tobacco Use  ?Smoking Status Former  ? Types: Cigars  ?Smokeless Tobacco Never  ? ? ?Goals Met:  ?Independence with exercise equipment ?Exercise tolerated well ?No report of concerns or symptoms today ?Strength training completed today ? ?Goals Unmet:  ?Not Applicable ? ?Comments: Check out at 1545. ? ? ?Dr. Carlyle Dolly is Medical Director for [place] ?

## 2021-07-20 ENCOUNTER — Encounter (HOSPITAL_COMMUNITY): Payer: Medicare HMO

## 2021-07-20 ENCOUNTER — Encounter (HOSPITAL_COMMUNITY)
Admission: RE | Admit: 2021-07-20 | Discharge: 2021-07-20 | Disposition: A | Discharge status: Home / Self Care | Payer: Medicare HMO | Source: Ambulatory Visit | Attending: Thoracic Surgery (Cardiothoracic Vascular Surgery) | Admitting: Thoracic Surgery (Cardiothoracic Vascular Surgery)

## 2021-07-20 ENCOUNTER — Other Ambulatory Visit: Payer: Self-pay | Admitting: Cardiology

## 2021-07-20 DIAGNOSIS — Z952 Presence of prosthetic heart valve: Secondary | ICD-10-CM

## 2021-07-20 DIAGNOSIS — Z951 Presence of aortocoronary bypass graft: Secondary | ICD-10-CM

## 2021-07-20 NOTE — Progress Notes (Signed)
Daily Session Note ? ?Patient Details  ?Name: Keith Wagner ?MRN: [id number] ?Date of Birth: 05/27/1940 ?Referring Provider:   ?Flowsheet Row CARDIAC REHAB PHASE II ORIENTATION from 05/01/2021 in [place]  ?Referring Provider Dr. Roxan Hockey  ? ?  ? ? ?Encounter Date: 07/20/2021 ? ?Check In: ? Session Check In - 07/20/21 1437   ? ?  ? Check-In  ? Supervising physician immediately available to respond to emergencies [facility] MD immediately available   ? Physician(s) Dr Domenic Polite   ? Location [facility]   ? Staff Present Madelyn Flavors, RN, BSN;Heather Otho Ket, BS, Exercise Physiologist;Dalton Kris Mouton, MS, ACSM-CEP, Exercise Physiologist   ? Virtual Visit No   ? Medication changes reported     No   ? Fall or balance concerns reported    No   ? Tobacco Cessation No Change   ? Warm-up and Cool-down Performed as group-led instruction   ? Resistance Training Performed Yes   ? VAD Patient? No   ? PAD/SET Patient? No   ?  ? Pain Assessment  ? Currently in Pain? No/denies   ? Pain Score 0-No pain   ? Multiple Pain Sites No   ? ?  ?  ? ?  ? ? ?Capillary Blood Glucose: ?No results found for this or any previous visit (from the past 24 hour(s)). ? ? ? ?Social History  ? ?Tobacco Use  ?Smoking Status Former  ? Types: Cigars  ?Smokeless Tobacco Never  ? ? ?Goals Met:  ?Independence with exercise equipment ?Exercise tolerated well ?No report of concerns or symptoms today ?Strength training completed today ? ?Goals Unmet:  ?Not Applicable ? ?Comments: Check out 1545. ? ? ?Dr. Carlyle Dolly is Medical Director for [place] ?

## 2021-07-23 ENCOUNTER — Encounter (HOSPITAL_COMMUNITY): Payer: Medicare HMO

## 2021-07-23 ENCOUNTER — Encounter (HOSPITAL_COMMUNITY)
Admission: RE | Admit: 2021-07-23 | Discharge: 2021-07-23 | Disposition: A | Discharge status: Home / Self Care | Payer: Medicare HMO | Source: Ambulatory Visit | Attending: Thoracic Surgery (Cardiothoracic Vascular Surgery) | Admitting: Thoracic Surgery (Cardiothoracic Vascular Surgery)

## 2021-07-23 DIAGNOSIS — Z951 Presence of aortocoronary bypass graft: Secondary | ICD-10-CM | POA: Diagnosis present

## 2021-07-23 DIAGNOSIS — Z952 Presence of prosthetic heart valve: Secondary | ICD-10-CM | POA: Diagnosis present

## 2021-07-23 NOTE — Progress Notes (Signed)
Daily Session Note ? ?Patient Details  ?Name: Keith Wagner ?MRN: [id number] ?Date of Birth: Jun 20, 1940 ?Referring Provider:   ?Flowsheet Row CARDIAC REHAB PHASE II ORIENTATION from 05/01/2021 in [place]  ?Referring Provider Dr. Roxan Hockey  ? ?  ? ? ?Encounter Date: 07/23/2021 ? ?Check In: ? Session Check In - 07/23/21 1436   ? ?  ? Check-In  ? Supervising physician immediately available to respond to emergencies [facility] MD immediately available   ? Physician(s) Dr Harl Bowie   ? Location [facility]   ? Staff Present Aundra Dubin, RN, Joanette Gula, RN, Bjorn Loser, MS, ACSM-CEP, Exercise Physiologist;Heather Zigmund Daniel, Exercise Physiologist   ? Virtual Visit No   ? Medication changes reported     No   ? Fall or balance concerns reported    No   ? Tobacco Cessation No Change   ? Warm-up and Cool-down Performed as group-led instruction   ? Resistance Training Performed Yes   ? VAD Patient? No   ? PAD/SET Patient? No   ?  ? Pain Assessment  ? Currently in Pain? No/denies   ? Pain Score 0-No pain   ? Multiple Pain Sites No   ? ?  ?  ? ?  ? ? ?Capillary Blood Glucose: ?No results found for this or any previous visit (from the past 24 hour(s)). ? ? ? ?Social History  ? ?Tobacco Use  ?Smoking Status Former  ? Types: Cigars  ?Smokeless Tobacco Never  ? ? ?Goals Met:  ?Independence with exercise equipment ?Exercise tolerated well ?No report of concerns or symptoms today ?Strength training completed today ? ?Goals Unmet:  ?Not Applicable ? ?Comments: check out at 1545. ? ? ?Dr. Carlyle Dolly is Medical Director for [place] ?

## 2021-07-25 ENCOUNTER — Encounter (HOSPITAL_COMMUNITY): Payer: Medicare HMO

## 2021-07-25 ENCOUNTER — Encounter (HOSPITAL_COMMUNITY)
Admission: RE | Admit: 2021-07-25 | Discharge: 2021-07-25 | Disposition: A | Discharge status: Home / Self Care | Payer: Medicare HMO | Source: Ambulatory Visit | Attending: Thoracic Surgery (Cardiothoracic Vascular Surgery) | Admitting: Thoracic Surgery (Cardiothoracic Vascular Surgery)

## 2021-07-25 DIAGNOSIS — Z952 Presence of prosthetic heart valve: Secondary | ICD-10-CM

## 2021-07-25 DIAGNOSIS — Z951 Presence of aortocoronary bypass graft: Secondary | ICD-10-CM

## 2021-07-25 NOTE — Progress Notes (Signed)
Daily Session Note ? ?Patient Details  ?Name: Keith Wagner ?MRN: [id number] ?Date of Birth: Mar 28, 1940 ?Referring Provider:   ?Flowsheet Row CARDIAC REHAB PHASE II ORIENTATION from 05/01/2021 in [place]  ?Referring Provider Dr. Roxan Hockey  ? ?  ? ? ?Encounter Date: 07/25/2021 ? ?Check In: ? Session Check In - 07/25/21 1444   ? ?  ? Check-In  ? Supervising physician immediately available to respond to emergencies [facility] MD immediately available   ? Physician(s) Dr. Alda Lea   ? Location [facility]   ? Staff Present Geanie Cooley, RN;Heather Otho Ket, BS, Exercise Physiologist;Dalton Fletcher, MS, ACSM-CEP, Exercise Physiologist   ? Virtual Visit No   ? Medication changes reported     No   ? Fall or balance concerns reported    No   ? Tobacco Cessation No Change   ? Warm-up and Cool-down Performed as group-led instruction   ? Resistance Training Performed Yes   ? VAD Patient? No   ? PAD/SET Patient? No   ?  ? Pain Assessment  ? Currently in Pain? No/denies   ? Pain Score 0-No pain   ? Multiple Pain Sites No   ? ?  ?  ? ?  ? ? ?Capillary Blood Glucose: ?No results found for this or any previous visit (from the past 24 hour(s)). ? ? ? ?Social History  ? ?Tobacco Use  ?Smoking Status Former  ? Types: Cigars  ?Smokeless Tobacco Never  ? ? ?Goals Met:  ?Independence with exercise equipment ?Exercise tolerated well ?No report of concerns or symptoms today ?Strength training completed today ? ?Goals Unmet:  ?Not Applicable ? ?Comments: check out @ 3:45pm ? ? ?Dr. Carlyle Dolly is Medical Director for [place] ?

## 2021-07-27 ENCOUNTER — Encounter (HOSPITAL_COMMUNITY): Payer: Medicare HMO

## 2021-07-27 ENCOUNTER — Encounter (HOSPITAL_COMMUNITY)
Admission: RE | Admit: 2021-07-27 | Discharge: 2021-07-27 | Disposition: A | Discharge status: Home / Self Care | Payer: Medicare HMO | Source: Ambulatory Visit | Attending: Cardiology | Admitting: Cardiology

## 2021-07-27 VITALS — Ht 66.0 in | Wt 166.9 lb

## 2021-07-27 DIAGNOSIS — Z951 Presence of aortocoronary bypass graft: Secondary | ICD-10-CM | POA: Diagnosis not present

## 2021-07-27 DIAGNOSIS — Z952 Presence of prosthetic heart valve: Secondary | ICD-10-CM

## 2021-07-27 NOTE — Progress Notes (Signed)
Daily Session Note ? ?Patient Details  ?Name: Keith Wagner ?MRN: [id number] ?Date of Birth: [birth year] ?Referring Provider:   ?Flowsheet Row CARDIAC REHAB PHASE II ORIENTATION from 05/01/2021 in [place]  ?Referring Provider Dr. Roxan Hockey  ? ?  ? ? ?Encounter Date: 07/27/2021 ? ?Check In: ? Session Check In - 07/27/21 1443   ? ?  ? Check-In  ? Supervising physician immediately available to respond to emergencies [facility] MD immediately available   ? Physician(s) Dr. Radford Pax   ? Location [facility]   ? Staff Present Geanie Cooley, RN;Heather Otho Ket, BS, Exercise Physiologist;Dalton Fletcher, MS, ACSM-CEP, Exercise Physiologist   ? Virtual Visit No   ? Medication changes reported     No   ? Fall or balance concerns reported    No   ? Tobacco Cessation No Change   ? Warm-up and Cool-down Performed as group-led instruction   ? Resistance Training Performed Yes   ? VAD Patient? No   ? PAD/SET Patient? No   ?  ? Pain Assessment  ? Currently in Pain? No/denies   ? Pain Score 0-No pain   ? Multiple Pain Sites No   ? ?  ?  ? ?  ? ? ?Capillary Blood Glucose: ?No results found for this or any previous visit (from the past 24 hour(s)). ? ? ? ?Social History  ? ?Tobacco Use  ?Smoking Status Former  ? Types: Cigars  ?Smokeless Tobacco Never  ? ? ?Goals Met:  ?Independence with exercise equipment ?Exercise tolerated well ?No report of concerns or symptoms today ?Strength training completed today ? ?Goals Unmet:  ?Not Applicable ? ?Comments: check out @ 3:45pm ? ? ?Dr. Carlyle Dolly is Medical Director for [place] ?

## 2021-07-30 NOTE — Progress Notes (Signed)
Discharge Progress Report ? ?Patient Details  ?Name: Keith Wagner ?MRN: [id number] ?Date of Birth: December 14, 1940 ?Referring Provider:   ?Flowsheet Row CARDIAC REHAB PHASE II ORIENTATION from 05/01/2021 in [place]  ?Referring Provider Dr. Roxan Hockey  ? ?  ? ? ? ?Number of Visits: 37 ? ?Reason for Discharge:  ?Patient reached a stable level of exercise. ?Patient independent in their exercise. ?Patient has met program and personal goals. ? ?Smoking History:  ?Social History  ? ?Tobacco Use  ?Smoking Status Former  ? Types: Cigars  ?Smokeless Tobacco Never  ? ? ?Diagnosis:  ?S/P CABG x 2 ? ?S/P AVR (aortic valve replacement) ? ?ADL [facility]: ? ? ?Initial Exercise Prescription: ? Initial Exercise Prescription - 05/01/21 1000   ? ?  ? Date of Initial Exercise RX and Referring Provider  ? Date 05/01/21   ? Referring Provider Dr. Roxan Hockey   ? Expected Discharge Date 07/20/21   ?  ? Treadmill  ? MPH 1.4   ? Grade 0   ? Minutes 17   ?  ? NuStep  ? Level 1   ? SPM 60   ? Minutes 22   ?  ? Prescription Details  ? Frequency (times per week) 3   ? Duration Progress to 30 minutes of continuous aerobic without signs/symptoms of physical distress   ?  ? Intensity  ? THRR 40-80% of Max Heartrate 56-113   ? Ratings of Perceived Exertion 11-13   ? Perceived Dyspnea 0-4   ?  ? Resistance Training  ? Training Prescription Yes   ? Weight 3   ? Reps 10-15   ? ?  ?  ? ?  ? ? ?Discharge Exercise Prescription (Final Exercise Prescription Changes): ? Exercise Prescription Changes - 07/16/21 1530   ? ?  ? Response to Exercise  ? Blood Pressure (Admit) 104/64   ? Blood Pressure (Exercise) 108/62   ? Blood Pressure (Exit) 108/70   ? Heart Rate (Admit) 59 bpm   ? Heart Rate (Exercise) 95 bpm   ? Heart Rate (Exit) 73 bpm   ? Rating of Perceived Exertion (Exercise) 11   ? Duration Continue with 30 min of aerobic exercise without signs/symptoms of physical distress.   ? Intensity THRR unchanged   ?  ? Progression  ? Progression  Continue to progress workloads to maintain intensity without signs/symptoms of physical distress.   ?  ? Resistance Training  ? Training Prescription Yes   ? Weight 3   ? Reps 10-15   ? Time 10 Minutes   ?  ? Treadmill  ? MPH 1.8   ? Grade 0   ? Minutes 17   ? METs 2.38   ?  ? NuStep  ? Level 3   ? SPM 118   ? Minutes 22   ? METs 3.89   ? ?  ?  ? ?  ? ? ?Functional Capacity: ? 6 Minute Walk   ? ? [place] Name 05/01/21 1013 07/27/21 1509  ?  ?  ? 6 Minute Walk  ? Phase Initial Discharge   ? Distance 1000 feet 1200 feet   ? Walk Time 6 minutes 6 minutes   ? # of Rest Breaks 1 0   ? MPH 1.9 2.27   ? METS 1.84 1.82   ? RPE 12 12   ? VO2 Peak 6.42 6.39   ? Symptoms Yes (comment) No   ? Comments one standing rest break 15 sec --   ?  Resting HR 62 bpm 62 bpm   ? Resting BP 116/60 112/70   ? Resting Oxygen Saturation  97 % 99 %   ? Exercise Oxygen Saturation  during 6 min walk 98 % 99 %   ? Max Ex. HR 90 bpm 89 bpm   ? Max Ex. BP 128/65 126/70   ? 2 Minute Post BP 118/63 116/70   ? ?  ?  ? ?  ? ? ?Psychological, QOL, Others - Outcomes: ?PHQ 2/9: ? ?  07/30/2021  ? 12:47 PM 05/01/2021  ?  9:45 AM  ?Depression screen PHQ 2/9  ?Decreased Interest 0 0  ?Down, Depressed, Hopeless 0 0  ?PHQ - 2 Score 0 0  ?Altered sleeping 1 1  ?Tired, decreased energy 0 0  ?Change in appetite 0 0  ?Feeling bad or failure about yourself  0 0  ?Trouble concentrating 0 0  ?Moving slowly or fidgety/restless 0 0  ?Suicidal thoughts 0 0  ?PHQ-9 Score 1 1  ?Difficult doing work/chores Not difficult at all Not difficult at all  ? ? ?Quality of Life: ? Quality of Life - 07/27/21 1511   ? ?  ? Quality of Life  ? Select Quality of Life   ?  ? Quality of Life Scores  ? Health/Function Pre 30 %   ? Health/Function Post 26.1 %   ? Health/Function % Change -13 %   ? Socioeconomic Pre 30 %   ? Socioeconomic Post 28.93 %   ? Socioeconomic % Change  -3.57 %   ? Psych/Spiritual Pre 30 %   ? Psych/Spiritual Post 28.93 %   ? Psych/Spiritual % Change -3.57 %   ? Family Pre  30 %   ? Family Post 27.5 %   ? Family % Change -8.33 %   ? GLOBAL Pre 30 %   ? GLOBAL Post 27.47 %   ? GLOBAL % Change -8.43 %   ? ?  ?  ? ?  ? ? ?Personal Goals: ?Goals established at orientation with interventions provided to work toward goal. ? Personal Goals and Risk Factors at Admission - 05/01/21 0950   ? ?  ? Core Components/Risk Factors/Patient Goals on Admission  ?  Weight Management Weight Maintenance   ? Heart Failure Yes   ? Intervention Provide a combined exercise and nutrition program that is supplemented with education, support and counseling about heart failure. Directed toward relieving symptoms such as shortness of breath, decreased exercise tolerance, and extremity edema.   ? Expected Outcomes Short term: Daily weights obtained and reported for increase. Utilizing diuretic protocols set by physician.   ? Hypertension Yes   ? Intervention Provide education on lifestyle modifcations including regular physical activity/exercise, weight management, moderate sodium restriction and increased consumption of fresh fruit, vegetables, and low fat dairy, alcohol moderation, and smoking cessation.   ? Expected Outcomes Short Term: Continued assessment and intervention until BP is < 140/38m HG in hypertensive participants. < 130/870mHG in hypertensive participants with diabetes, heart failure or chronic kidney disease.;Long Term: Maintenance of blood pressure at goal levels.   ? Personal Goal Other Yes   ? Personal Goal Patient wants to improve his strength and stamina and be able to work in his yard and garden this Spring without having to stop and rest. He would also like to play golf again.   ? Intervention Patient will attend CR 3 days/week with exercise and education and supplement with exercise at home.   ? Expected  Outcomes Patient will complete the program meeting both program and personal goals.   ? ?  ?  ? ?  ?  ? ?Personal Goals Discharge: ? Goals and Risk Factor Review   ? ? [place] Name 05/16/21  [phone] 06/13/21 0941 07/09/21 1358 07/30/21 1254  ?  ?  ? Core Components/Risk Factors/Patient Goals Review  ? Personal Goals Review Weight Management/Obesity;Hypertension;Heart Failure;Other Weight Management/Obesity;Hypertension;Heart Failure;Other Weight Management/Obesity;Hypertension;Heart Failure;Other Weight Management/Obesity;Hypertension;Heart Failure;Other   ? Review Patient was referred to CR with CABGx2. He has completed 3 sessions with current weight at 163.9 up 3 lbs from his initial weight. He has multiple risk factors for CAD and is participating in the program for risk modification. His blood pressue is hypertensive. We will continue to monitor. His personal goals for the program are to improve his strength and stamina; be able to do his yardwork and gardening again and play golf again. We will continue to monitor his progress as he works towards meeting these goals. Patient has completed 15 sessions with current weight at 168.9 up 5 lbs since last 30 day review.  His blood pressue is liable. Was seen by Cardiogist on 05/17/2021. No changes meds.  Based on home BP readings, medication changes included stopping Lasix and adding Spironolactone 25 mg on 05/30/2021. We will contuine to monitor.  Pt is on pace to meet his goals to improve his strength and stamina; be able to do his yardwork and gardening again and play golf again. We will continue to monitor his progress as he works towards meeting these goals. Patient has completed 27 sessions with current weight at 166.5 lbs down 2.4 lbs since last 30 day review.  Based on home BP readings, medication changes included stopping Lasix and adding Spironolactone 25 mg on 05/30/2021 Pt has maintained goal BP, most recent BP 110/76.   We will contuine to monitor.  Pt is on pace to meet his goals to improve his strength and stamina; be able to do his yardwork and gardening again and play golf again. We will continue to monitor his progress as he works towards  meeting these goals. Pt graduated from CR after 36 sessions. He gained 6 lbs while in the program. All vitals were WNLs while he was in the program. He was able to improve his strength and stamina and re

## 2021-08-14 ENCOUNTER — Ambulatory Visit: Payer: Medicare HMO | Admitting: Cardiology

## 2021-08-14 ENCOUNTER — Encounter: Payer: Self-pay | Admitting: Cardiology

## 2021-08-14 VITALS — BP 156/99 | HR 59 | Temp 97.8°F | Resp 16 | Ht 66.0 in | Wt 167.0 lb

## 2021-08-14 DIAGNOSIS — Z951 Presence of aortocoronary bypass graft: Secondary | ICD-10-CM

## 2021-08-14 DIAGNOSIS — Z7901 Long term (current) use of anticoagulants: Secondary | ICD-10-CM

## 2021-08-14 DIAGNOSIS — I10 Essential (primary) hypertension: Secondary | ICD-10-CM

## 2021-08-14 DIAGNOSIS — I480 Paroxysmal atrial fibrillation: Secondary | ICD-10-CM

## 2021-08-14 DIAGNOSIS — E782 Mixed hyperlipidemia: Secondary | ICD-10-CM

## 2021-08-14 DIAGNOSIS — Z952 Presence of prosthetic heart valve: Secondary | ICD-10-CM

## 2021-08-14 DIAGNOSIS — I5032 Chronic diastolic (congestive) heart failure: Secondary | ICD-10-CM

## 2021-08-14 DIAGNOSIS — I2510 Atherosclerotic heart disease of native coronary artery without angina pectoris: Secondary | ICD-10-CM

## 2021-08-14 MED ORDER — EZETIMIBE 10 MG PO TABS: 10.0000 mg | ORAL_TABLET | Freq: Every day | ORAL | 0 refills | Status: DC

## 2021-08-14 MED ORDER — SPIRONOLACTONE 25 MG PO TABS: 12.5000 mg | ORAL_TABLET | Freq: Every morning | ORAL | 0 refills | Status: DC

## 2021-08-14 NOTE — Progress Notes (Signed)
ID:  TC KAPUSTA, DOB 1940/11/26, MRN [id number]  PCP:  Aletha Halim., PA-C  Cardiologist:  Rex Kras, DO, [facility] (established care 06/10/2019) Cardiothoracic surgeon: Dr. Roxan Hockey Former Cardiology Providers: Jeri Lager, APRN, FNP-C  Date: 08/14/21 Last Office Visit: 05/17/2021  Chief Complaint  Patient presents with   Follow-up   Congestive Heart Failure    3 months    HPI  Keith Wagner is a 81 y.o. 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  Patient presented to the office sooner than his scheduled appointment back in September 2022 and underwent an echocardiogram which noted reduced LVEF along with grade 2 diastolic impairment and elevated left atrial pressure.  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  On March 01, 2021 he underwent two-vessel CABG (LIMA to the LAD and SVG to OM) and a bioprosthetic aortic valve with 21 mm Edwards Inspiris Resilia valve.  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  Postoperatively he also developed atrial fibrillation and has been on beta-blockers/amiodarone/oral anticoagulation given his CHA2DS2-VASc score.  Since discharge from the hospital on March 12, 2021 his medications have been uptitrated given his cardiomyopathy/CAD/heart failure.  He now presents for follow-up.  Patient has  completed cardiac rehab as of Jul 25, 2021.  He has joined the [employer] and does cardiac rehab.  He is compliant with his medical therapy and states that he is back to baseline.  EKG today confirms underlying normal sinus rhythm.  We discussed the risks, benefits, and alternatives to oral anticoagulation (i.e. Watchman device) and he would like to continue with oral anticoagulation.  Outside labs independently reviewed.  Patient was also accompanied by his wife [name] who provides collateral history as part of today's encounter.  ALLERGIES: No Known Allergies  MEDICATION LIST PRIOR TO VISIT: Current Meds  Medication Sig   acetaminophen (TYLENOL) 500 MG tablet Take 1,000 mg by mouth every 6 (six) hours as needed for moderate pain.   apixaban (ELIQUIS) 5 MG TABS tablet Take 1 tablet (5 mg total) by mouth 2 (two) times daily.   aspirin 81 MG tablet Take 81 mg by mouth daily.   atorvastatin (LIPITOR) 40 MG tablet Take 1 tablet (40 mg total) by mouth daily.   calcium carbonate (TUMS - DOSED IN MG ELEMENTAL CALCIUM) 500 MG chewable tablet Chew 500-1,000 mg by mouth daily as needed for indigestion or heartburn.   clobetasol cream (TEMOVATE) 1.94 % Apply 1 application topically 2 (two) times daily as needed (eczema).   ezetimibe (ZETIA) 10 MG tablet Take 1 tablet (10 mg total) by mouth daily.   FARXIGA 10 MG TABS tablet TAKE 1 TABLET BY MOUTH ONCE DAILY BEFORE BREAKFAST   feeding supplement (ENSURE ENLIVE / ENSURE PLUS) LIQD Take 237 mLs by mouth 2 (two) times daily between meals.   Lutein 6 MG TABS Take 6 mg by mouth daily.   magnesium oxide (MAG-OX) 400 MG tablet Take 1 tablet (400 mg total)  by mouth daily.   metoprolol succinate (TOPROL-XL) 25 MG 24 hr tablet TAKE 1 TABLET BY MOUTH ONCE DAILY. HOLD IF TOP BLOOD PRESSURE NUMBER IS LESS THAN 100 MMHG OR PILSE LES THAN 60 BPM (Patient taking differently: Take 25 mg by mouth daily. HOLD IF TOP BLOOD PRESSURE NUMBER IS LESS THAN 100 MMHG OR PILSE LES  THAN 60 BPM)   Multiple Vitamins-Minerals (MULTIVITAMIN PO) Take 1 tablet by mouth daily.   Omega-3 Fatty Acids (FISH OIL) 1000 MG CAPS Take 1,000 mg by mouth daily.   OVER THE COUNTER MEDICATION Take 1 tablet by mouth daily. Super Beta Prostate Advanced   sacubitril-valsartan (ENTRESTO) 97-103 MG Take 1 tablet by mouth 2 (two) times daily.   triamcinolone cream (KENALOG) 0.1 % Apply 1 application topically 2 (two) times daily as needed (eczema).   [DISCONTINUED] spironolactone (ALDACTONE) 25 MG tablet Take 1 tablet (25 mg total) by mouth daily. (Patient taking differently: Take 12.5 mg by mouth daily.)     PAST MEDICAL HISTORY: Past Medical History:  Diagnosis Date   Arthritis    Asthma    uses inhaler PRN   Bilateral carotid bruits 06/12/2018   Cataract    Diabetes mellitus without complication ([place])    per pt "pre-diabetic"- off Metformin- diet controlled    GERD (gastroesophageal reflux disease)    Gout    Heart murmur    Hypercholesteremia 06/12/2018   Hyperlipidemia    Hypertension    Mild aortic stenosis 06/12/2018    PAST SURGICAL HISTORY: Past Surgical History:  Procedure Laterality Date   AORTIC VALVE REPLACEMENT N/A 03/01/2021   Procedure: AORTIC VALVE REPLACEMENT (AVR) WITH INSPIRIS RESILIA  AORTIC VALVE SIZE 21MM;  Surgeon: [name], MD;  Location: [place];  Service: Open Heart Surgery;  Laterality: N/A;   CARDIAC CATHETERIZATION     CATARACT EXTRACTION Left    CHEST EXPLORATION  03/01/2021   Procedure: CHEST EXPLORATION;  Surgeon: [name], MD;  Location: [facility] OR;  Service: Open Heart Surgery;;   COLONOSCOPY     CORONARY ARTERY BYPASS GRAFT N/A 03/01/2021   Procedure: CORONARY ARTERY BYPASS GRAFTING (CABG) X 2 USING LEFT INTERNAL MAMMARY ARTERY AND RIGHT ENDOSCOPIC GREATER SAPHENOUS VEIN CONDUITS;  Surgeon: [name], MD;  Location: [place];  Service: Open Heart Surgery;  Laterality: N/A;   ENDOVEIN HARVEST OF GREATER SAPHENOUS VEIN  Right 03/01/2021   Procedure: ENDOVEIN HARVEST OF GREATER SAPHENOUS VEIN;  Surgeon: [name], MD;  Location: [place];  Service: Open Heart Surgery;  Laterality: Right;   EYE SURGERY     POLYPECTOMY     RIGHT HEART CATH AND CORONARY ANGIOGRAPHY N/A 02/06/2021   Procedure: RIGHT HEART CATH AND CORONARY ANGIOGRAPHY;  Surgeon: Nigel Mormon, MD;  Location: [place] [facility];  Service: Cardiovascular;  Laterality: N/A;   TEE WITHOUT CARDIOVERSION N/A 03/01/2021   Procedure: TRANSESOPHAGEAL ECHOCARDIOGRAM (TEE);  Surgeon: [name], MD;  Location: [place];  Service: Open Heart Surgery;  Laterality: N/A;   TEMPORARY PACEMAKER N/A 03/03/2021   Procedure: TEMPORARY PACEMAKER;  Surgeon: Adrian Prows, MD;  Location: [place] [facility];  Service: Cardiovascular;  Laterality: N/A;   THORACIC AORTOGRAM N/A 02/06/2021   Procedure: THORACIC AORTOGRAM;  Surgeon: Nigel Mormon, MD;  Location: [place] [facility];  Service: Cardiovascular;  Laterality: N/A;    FAMILY HISTORY: No family history of premature coronary disease or sudden cardiac death.  SOCIAL HISTORY:  The patient  reports that he quit  smoking about 63 years ago. His smoking use included cigars. He has never used smokeless tobacco. He reports that he does not drink alcohol and does not use drugs.  REVIEW OF SYSTEMS: Review of Systems  Cardiovascular:  Negative for chest pain, claudication, dyspnea on exertion, leg swelling, orthopnea, palpitations, paroxysmal nocturnal dyspnea and syncope.  Respiratory:  Negative for shortness of breath.    PHYSICAL EXAM:    08/14/2021    3:00 PM 08/14/2021    2:53 PM 07/27/2021    3:10 PM  Vitals with BMI  Height  _0  _1   Weight  167 lbs 166 lbs 14 oz  BMI  83.15 17.61  Systolic 607 371   Diastolic 99 82   Pulse 59 60    CONSTITUTIONAL: Appears older than stated age, hemodynamically stable, no acute distress.  SKIN: Skin is warm and dry. No rash noted. No cyanosis.  No pallor. No jaundice HEAD: Normocephalic and atraumatic.  EYES: No scleral icterus MOUTH/THROAT: Moist oral membranes.  NECK: No JVD present. No thyromegaly noted. LYMPHATIC: No visible cervical adenopathy.  CHEST Normal respiratory effort. No intercostal retractions.  Sternotomy site is healing well. LUNGS: Clear to auscultation bilaterally.  No rales.  No stridor. No wheezes.  CARDIOVASCULAR: Regular, positive G6-Y6, soft holosystolic murmur heard at the apex, no gallops or rubs. ABDOMINAL: Soft, nontender, nondistended, positive bowel sounds in all 4 quadrants no apparent ascites.  EXTREMITIES: Trace bilateral peripheral edema,  2+ DP and PT pulses. HEMATOLOGIC: No significant bruising NEUROLOGIC: Oriented to person, place, and time. Nonfocal. Normal muscle tone.  PSYCHIATRIC: Normal mood and affect. Normal behavior. Cooperative   CARDIAC DATABASE: CABG x2 on 03/01/2021: LIMA to LAD and SVG to OM and aortic valve replacement with a 21 mm Edwards InspirEase Resilia bioprosthetic valve.   Temporary Pacer Placement 03/03/2021.  EKG: 03/15/2021: Atrial fibrillation, 120 bpm, nonspecific IVCD, consider old anteroseptal infarct, ST-T changes in the high lateral and lateral leads suggestive of ischemia, without underlying injury pattern. 08/14/2021: Sinus bradycardia, 56 bpm, occasional PVCs.  Echocardiogram: 05/25/2019: LVEF 94-85%, grade 1 diastolic impairment  46/27/0350: LVEF 30-35%, moderate LVH, global HK, grade 2 DD, elevated LAP, mild LAE, moderate AAS, moderate AR, moderate MR, mild TR, mild PHTN.  03/03/2021:  1. Left ventricular ejection fraction, by estimation, is 50 to 55%. Left ventricular ejection fraction by PLAX is 45 %. The left ventricle has low normal function. The left ventricle has no regional wall motion normalities. There is mild left ventricular hypertrophy. Left ventricular diastolic parameters are consistent with Grade II diastolic dysfunction  (pseudonormalization). Elevated left ventricular end-diastolic pressure.   2. Right ventricular systolic function is normal. The right ventricular size is normal.   3. Left atrial size was moderately dilated.   4. The mitral valve was not well visualized. Trivial mitral valve regurgitation.   5. The aortic valve was not well visualized. Aortic valve regurgitation is not visualized. Mild aortic valve stenosis. There is a 21 mm Edwards InspirEase Resilia valve present in the aortic position. Aortic valve area, by VTI measures 0.98 cm.  Aortic valve mean gradient measures 17.5 mmHg. Aortic valve Vmax measures 2.77 m/s.   6. The inferior vena cava is dilated in size with <50% respiratory variability, suggesting right atrial pressure of 15 mmHg.    Stress Testing: Treadmill Exercise stress 11/11/2014: Indication: Diabetes, HTN The patient exercised according to [facility] Protocol, Total time recorded  6:00 min achieving max heart rate of  148 which was  100% of MPHR  for age and  7.05 METS of work. Occasional PVC during exercise.  Normal BP response. Resting ECG showing NSR. There was no ST-T changes of ischemia with exercise stress test. No exercise induced arrythmias noted. Stress terminated due to [facility] met.  Heart Catheterization: LHC / RHC / Thoracic Aortogram 02/06/2021: LM: Distal calcific 75% stenosis LAD: Mid 80% stenosis LCX: large vessel. Ostial 70% stenosis.  RCA: Dominant, but small vessel. No significant disease seen on well opacified non-selective angiogram.   Normal PCW Unable to cross aortic valve due to severe aorta tortuosity.   CVTS consult for CABG +/- AVR  Carotid artery duplex 12/04/2017: Minimal stenosis in the right internal carotid artery (1-15%). Minimal stenosis in the left internal carotid artery (1-15%). Antegrade right vertebral artery flow. Antegrade left vertebral artery flow. Follow up when clinically indicated.  LABORATORY DATA:    Latest Ref Rng & Units  04/16/2021   10:24 AM 03/09/2021    2:58 AM 03/07/2021    3:34 AM  CBC  WBC 4.0 - 10.5 K/uL  10.2   9.5    Hemoglobin 13.0 - 17.7 g/dL 11.2   8.7   8.0    Hematocrit 37.5 - 51.0 % 37.8   28.0   25.2    Platelets 150 - 400 K/uL  359   252         Latest Ref Rng & Units 06/28/2021    9:06 AM 06/19/2021    9:33 AM 06/08/2021    8:35 AM  CMP  Glucose 70 - 99 mg/dL 101   107   110    BUN 8 - 27 mg/dL 24   45   20    Creatinine 0.76 - 1.27 mg/dL 1.49   1.95   1.43    Sodium 134 - 144 mmol/L 137   136   139    Potassium 3.5 - 5.2 mmol/L 4.9   5.8   5.3    Chloride 96 - 106 mmol/L 101   99   102    CO2 20 - 29 mmol/L _0 Calcium 8.6 - 10.2 mg/dL 8.9   10.3   10.0      Lipid Panel  No results found for: CHOL, TRIG, HDL, CHOLHDL, VLDL, LDLCALC, LDLDIRECT, LABVLDL  No components found for: NTPROBNP Recent Labs    12/22/20 0833 01/01/21 0930 01/30/21 0920 03/27/21 0939 04/16/21 1024 04/26/21 1037 05/25/21 0914 06/08/21 0833 06/19/21 0930 06/28/21 0903  PROBNP 1,673* 1,231* 412 922* 739* 484 403 378 269 464   Recent Labs    03/11/21 1246  TSH 8.253*    BMP Recent Labs    03/10/21 0113 03/11/21 0059 03/12/21 0410 03/27/21 0939 06/08/21 0835 06/19/21 0933 06/28/21 0906  NA 135 137 135   < > 139 136 137  K 3.9 4.1 4.2   < > 5.3* 5.8* 4.9  CL 102 104 104   < > 102 99 101  CO2 _1 < > _2 GLUCOSE 109* 99 99   < > 110* 107* 101*  BUN 29* 27* 26*   < > 20 45* 24  CREATININE 1.52* 1.41* 1.41*   < > 1.43* 1.95* 1.49*  CALCIUM 8.2* 8.4* 8.3*   < > 10.0 10.3* 8.9  GFRNONAA 46* 51* 51*  --   --   --   --    < > = values in this interval not displayed.  HEMOGLOBIN A1C Lab Results  Component Value Date   HGBA1C 6.3 (H) 02/27/2021   MPG 134.11 02/27/2021    11/29/2020 Sodium 142, potassium 4, chloride 105, bicarb 29, BUN 16, creatinine 1.23. Magnesium 2.4. BNP 1351  11/22/2020: Hemoglobin 11.8 g/dL, hematocrit 36.6%  Outside labs  07/11/2021  Total cholesterol 145, triglycerides 137, HDL 45, LDL direct 89, non-HDL 100. Hemoglobin 11.7 g/dL, hematocrit 37.9% A1c 5.9  IMPRESSION:    ICD-10-CM   1. Chronic combined systolic and diastolic congestive heart failure, NYHA class 2 (HCC)  I50.42 spironolactone (ALDACTONE) 25 MG tablet    EKG 12-Lead    2. Coronary artery disease involving native coronary artery of native heart without angina pectoris  I25.10 spironolactone (ALDACTONE) 25 MG tablet    ezetimibe (ZETIA) 10 MG tablet    Lipid Panel With LDL/HDL Ratio    LDL cholesterol, direct    CMP14+EGFR    3. S/P CABG x 2  Z95.1 spironolactone (ALDACTONE) 25 MG tablet    ezetimibe (ZETIA) 10 MG tablet    Lipid Panel With LDL/HDL Ratio    LDL cholesterol, direct    CMP14+EGFR    4. S/P Bioprosthetic AVR 21 mm Edward InspirEase Resilia  Z95.2     5. Paroxysmal atrial fibrillation (HCC)  I48.0     6. Long term (current) use of anticoagulants  Z79.01 Hemoglobin and hematocrit, blood    7. Benign essential hypertension  I10     8. Mixed hyperlipidemia  E78.2 spironolactone (ALDACTONE) 25 MG tablet    ezetimibe (ZETIA) 10 MG tablet    Lipid Panel With LDL/HDL Ratio    LDL cholesterol, direct    CMP14+EGFR        RECOMMENDATIONS: BENJERMAN MOLINELLI is a 81 y.o. 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  Chronic diastolic congestive heart failure, NYHA class 2 (HCC) Euvolemic. Stage C, NYHA class II. Home blood pressure and weight log reviewed as part of today's office visit. Weight prior to discharge from December 2022 77.1 kg. Refill spironolactone. We will take Lasix  on a as needed basis.  Patient is asked to supplement his diet with a banana for the days he takes Lasix for potassium hemostasis. Outside labs from April 2023 independently reviewed. EKG ordered and independently reviewed which illustrates sinus bradycardia with rare PVCs.  Coronary artery disease involving native coronary artery of native heart without angina pectoris; S/P CABG x 2 Denies angina pectoris. Medications reconciled. Educated the importance of secondary prevention. Currently on atorvastatin 40 mg p.o. nightly. We will initiate Zetia 10 mg p.o. daily. Recommend a goal LDL less than 55 mg/dL if able to tolerate.  S/P Bioprosthetic AVR 21 mm Edward InspirEase Resilia Fingerprint echo March 03, 2021. Monitor for now.  Paroxysmal atrial fibrillation ([place]) Discovered postop December 2022. Rate control: Toprol-XL. Rhythm control: N/A (history of amiodarone). Thromboembolic prophylaxis: Eliquis EKG today notes sinus bradycardia.  Long term (current) use of anticoagulants Indication: Paroxysmal atrial fibrillation. Outside labs independently reviewed, hemoglobin almost back to baseline. Does not endorse evidence of bleeding. CHA2DS2-VASc SCORE is 5 which correlates to 7.2% risk of stroke per year (age, CAD, CHF, HTN).  Discussed the risks, benefits, and alternatives to oral anticoagulation.  Both  patient and wife would like to continue oral anticoagulation for thromboembolic prophylaxis.  Benign essential hypertension Office blood pressures are not at goal. Home blood pressures are very well controlled. Home blood pressure log and weight log reviewed and uploaded into the media section. Continue current medical therapy as noted above.  Mixed hyperlipidemia Currently on atorvastatin 40 mg p.o. nightly. Most recent lipid profile reviewed, LDL currently not at goal. Add Zetia 10 mg p.o. daily. Repeat blood work in 6 weeks to reevaluate lipid profile and LFTs.   FINAL  MEDICATION LIST END OF ENCOUNTER: Meds ordered this encounter  Medications   spironolactone (ALDACTONE) 25 MG tablet    Sig: Take 0.5 tablets (12.5 mg total) by mouth every morning.    Dispense:  90 tablet    Refill:  0   ezetimibe (ZETIA) 10 MG tablet    Sig: Take 1 tablet (10 mg total) by mouth daily.    Dispense:  90 tablet    Refill:  0     Medications Discontinued During This Encounter  Medication Reason   amiodarone (PACERONE) 200 MG tablet    spironolactone (ALDACTONE) 25 MG tablet Reorder     Current Outpatient Medications:    acetaminophen (TYLENOL) 500 MG tablet, Take 1,000 mg by mouth every 6 (six) hours as needed for moderate pain., Disp: , Rfl:    apixaban (ELIQUIS) 5 MG TABS tablet, Take 1 tablet (5 mg total) by mouth 2 (two) times daily., Disp: 60 tablet, Rfl: 3   aspirin 81 MG tablet, Take 81 mg by mouth daily., Disp: , Rfl:    atorvastatin (LIPITOR) 40 MG tablet, Take 1 tablet (40 mg total) by mouth daily., Disp: 30 tablet, Rfl: 3   calcium carbonate (TUMS - DOSED IN MG ELEMENTAL CALCIUM) 500 MG chewable tablet, Chew 500-1,000 mg by mouth daily as needed for indigestion or heartburn., Disp: , Rfl:    clobetasol cream (TEMOVATE) 5.46 %, Apply 1 application topically 2 (two) times daily as needed (eczema)., Disp: , Rfl:    ezetimibe (ZETIA) 10 MG tablet, Take 1 tablet (10 mg total) by mouth daily., Disp: 90 tablet, Rfl: 0   FARXIGA 10 MG TABS tablet, TAKE 1 TABLET BY MOUTH ONCE DAILY BEFORE BREAKFAST, Disp: 90 tablet, Rfl: 0   feeding supplement (ENSURE ENLIVE / ENSURE PLUS) LIQD, Take 237 mLs by mouth 2 (two) times daily between meals., Disp: 237 mL, Rfl: 12   Lutein 6 MG TABS, Take 6 mg by mouth daily., Disp: , Rfl:    magnesium oxide (MAG-OX) 400 MG tablet, Take 1 tablet (400 mg total) by mouth daily., Disp: 30 tablet, Rfl: 11   metoprolol succinate (TOPROL-XL) 25 MG 24 hr tablet, TAKE 1 TABLET BY MOUTH ONCE DAILY. HOLD IF TOP BLOOD PRESSURE NUMBER IS LESS THAN 100  MMHG OR PILSE LES THAN 60 BPM (Patient taking differently: Take 25 mg by mouth daily. HOLD IF TOP BLOOD PRESSURE NUMBER IS LESS THAN 100 MMHG OR PILSE LES THAN 60 BPM), Disp: 30 tablet, Rfl: 3   Multiple Vitamins-Minerals (MULTIVITAMIN PO), Take 1 tablet by mouth daily., Disp: , Rfl:    Omega-3 Fatty Acids (FISH OIL) 1000 MG CAPS, Take 1,000 mg by mouth daily., Disp: , Rfl:    OVER THE COUNTER MEDICATION, Take 1 tablet by mouth daily. Super Beta Prostate Advanced, Disp: , Rfl:    sacubitril-valsartan (ENTRESTO) 97-103 MG, Take 1 tablet by mouth 2 (two) times daily., Disp: 180 tablet, Rfl: 0   triamcinolone cream (KENALOG)  0.1 %, Apply 1 application topically 2 (two) times daily as needed (eczema)., Disp: , Rfl:    spironolactone (ALDACTONE) 25 MG tablet, Take 0.5 tablets (12.5 mg total) by mouth every morning., Disp: 90 tablet, Rfl: 0  Orders Placed This Encounter  Procedures   Lipid Panel With LDL/HDL Ratio   LDL cholesterol, direct   CMP14+EGFR   Hemoglobin and hematocrit, blood   EKG 12-Lead    There are no Patient Instructions on file for this visit.   --Continue cardiac medications as reconciled in final medication list. --Return in about 6 months (around 02/14/2022) for Follow up, CAD, heart failure management.. Or sooner if needed. --Continue follow-up with your primary care physician regarding the management of your other chronic comorbid conditions.  Patient's questions and concerns were addressed to his satisfaction. He voices understanding of the instructions provided during this encounter.   This note was created using a voice recognition software as a result there may be grammatical errors inadvertently enclosed that do not reflect the nature of this encounter. Every attempt is made to correct such errors.  Rex Kras, [place], [facility]  Pager: [phone] Office: [phone]

## 2021-09-25 ENCOUNTER — Other Ambulatory Visit: Payer: Self-pay | Admitting: Cardiology

## 2021-09-25 DIAGNOSIS — Z7901 Long term (current) use of anticoagulants: Secondary | ICD-10-CM

## 2021-09-25 DIAGNOSIS — E782 Mixed hyperlipidemia: Secondary | ICD-10-CM

## 2021-09-25 DIAGNOSIS — I2510 Atherosclerotic heart disease of native coronary artery without angina pectoris: Secondary | ICD-10-CM

## 2021-09-25 DIAGNOSIS — Z951 Presence of aortocoronary bypass graft: Secondary | ICD-10-CM

## 2021-09-25 LAB — CMP14+EGFR
ALT: 19 IU/L (ref 0–44)
AST: 21 IU/L (ref 0–40)
Albumin/Globulin Ratio: 2.1 (ref 1.2–2.2)
Albumin: 4.2 g/dL (ref 3.7–4.7)
Alkaline Phosphatase: 59 IU/L (ref 44–121)
BUN/Creatinine Ratio: 14 (ref 10–24)
BUN: 20 mg/dL (ref 8–27)
Bilirubin Total: 0.5 mg/dL (ref 0.0–1.2)
CO2: 22 mmol/L (ref 20–29)
Calcium: 9.8 mg/dL (ref 8.6–10.2)
Chloride: 104 mmol/L (ref 96–106)
Creatinine, Ser: 1.43 mg/dL — ABNORMAL HIGH (ref 0.76–1.27)
Globulin, Total: 2 g/dL (ref 1.5–4.5)
Glucose: 95 mg/dL (ref 70–99)
Potassium: 5 mmol/L (ref 3.5–5.2)
Sodium: 137 mmol/L (ref 134–144)
Total Protein: 6.2 g/dL (ref 6.0–8.5)
eGFR: 50 mL/min/{1.73_m2} — ABNORMAL LOW (ref >59)

## 2021-09-25 LAB — CBC
Hematocrit: 41.3 % (ref 37.5–51.0)
Hemoglobin: 12.6 g/dL — ABNORMAL LOW (ref 13.0–17.7)
MCH: 23.6 pg — ABNORMAL LOW (ref 26.6–33.0)
MCHC: 30.5 g/dL — ABNORMAL LOW (ref 31.5–35.7)
MCV: 77 fL — ABNORMAL LOW (ref 79–97)
Platelets: 268 10*3/uL (ref 150–450)
RBC: 5.35 x10E6/uL (ref 4.14–5.80)
RDW: 17.9 % — ABNORMAL HIGH (ref 11.6–15.4)
WBC: 8.6 10*3/uL (ref 3.4–10.8)

## 2021-09-25 LAB — PRO B NATRIURETIC PEPTIDE: NT-Pro BNP: 344 pg/mL (ref 0–486)

## 2021-09-26 NOTE — Progress Notes (Signed)
Called and spoke to patient's wife she stated last week he had "more fluid" but he is doing better I went over his meds what is in his med list is correct. Looks like that test is back

## 2021-09-28 NOTE — Progress Notes (Signed)
No patient needs to go back and get those done

## 2021-10-12 NOTE — Progress Notes (Signed)
Can you order labs and release them please?

## 2021-10-15 ENCOUNTER — Other Ambulatory Visit: Payer: Self-pay

## 2021-10-15 DIAGNOSIS — E782 Mixed hyperlipidemia: Secondary | ICD-10-CM

## 2021-10-15 DIAGNOSIS — I2510 Atherosclerotic heart disease of native coronary artery without angina pectoris: Secondary | ICD-10-CM

## 2021-10-15 NOTE — Progress Notes (Signed)
Ok done

## 2021-10-22 ENCOUNTER — Other Ambulatory Visit: Payer: Self-pay | Admitting: Cardiology

## 2021-10-22 DIAGNOSIS — I5042 Chronic combined systolic (congestive) and diastolic (congestive) heart failure: Secondary | ICD-10-CM

## 2021-10-25 ENCOUNTER — Other Ambulatory Visit: Payer: Self-pay

## 2021-10-25 MED ORDER — ENTRESTO 97-103 MG PO TABS: 1.0000 | ORAL_TABLET | Freq: Two times a day (BID) | ORAL | 0 refills | Status: DC

## 2021-10-30 ENCOUNTER — Other Ambulatory Visit: Payer: Self-pay | Admitting: Cardiology

## 2021-10-30 DIAGNOSIS — E782 Mixed hyperlipidemia: Secondary | ICD-10-CM

## 2021-10-30 DIAGNOSIS — Z951 Presence of aortocoronary bypass graft: Secondary | ICD-10-CM

## 2021-10-30 DIAGNOSIS — I2510 Atherosclerotic heart disease of native coronary artery without angina pectoris: Secondary | ICD-10-CM

## 2021-11-05 ENCOUNTER — Other Ambulatory Visit: Payer: Self-pay

## 2021-11-05 DIAGNOSIS — I480 Paroxysmal atrial fibrillation: Secondary | ICD-10-CM

## 2021-11-05 MED ORDER — APIXABAN 5 MG PO TABS: 5.0000 mg | ORAL_TABLET | Freq: Two times a day (BID) | ORAL | 3 refills | Status: DC

## 2021-11-13 ENCOUNTER — Other Ambulatory Visit: Payer: Self-pay | Admitting: Cardiology

## 2021-12-17 ENCOUNTER — Other Ambulatory Visit: Payer: Self-pay | Admitting: Cardiology

## 2021-12-17 DIAGNOSIS — I5041 Acute combined systolic (congestive) and diastolic (congestive) heart failure: Secondary | ICD-10-CM

## 2022-01-09 ENCOUNTER — Telehealth: Payer: Self-pay

## 2022-01-09 NOTE — Telephone Encounter (Signed)
Yes. But get guidance from PCP with regards to timeframe.  Dr. Terri Skains

## 2022-01-11 NOTE — Telephone Encounter (Signed)
Called and spoke to patient he voiced understanding

## 2022-01-14 ENCOUNTER — Other Ambulatory Visit: Payer: Self-pay | Admitting: Cardiology

## 2022-01-21 ENCOUNTER — Other Ambulatory Visit: Payer: Self-pay | Admitting: Cardiology

## 2022-01-21 DIAGNOSIS — I5041 Acute combined systolic (congestive) and diastolic (congestive) heart failure: Secondary | ICD-10-CM

## 2022-01-25 NOTE — Telephone Encounter (Signed)
error 

## 2022-01-28 ENCOUNTER — Other Ambulatory Visit: Payer: Self-pay | Admitting: Cardiology

## 2022-01-28 DIAGNOSIS — I2510 Atherosclerotic heart disease of native coronary artery without angina pectoris: Secondary | ICD-10-CM

## 2022-01-28 DIAGNOSIS — E782 Mixed hyperlipidemia: Secondary | ICD-10-CM

## 2022-01-28 DIAGNOSIS — Z951 Presence of aortocoronary bypass graft: Secondary | ICD-10-CM

## 2022-02-15 ENCOUNTER — Other Ambulatory Visit: Payer: Self-pay | Admitting: Cardiology

## 2022-02-15 DIAGNOSIS — Z951 Presence of aortocoronary bypass graft: Secondary | ICD-10-CM

## 2022-02-15 DIAGNOSIS — I2510 Atherosclerotic heart disease of native coronary artery without angina pectoris: Secondary | ICD-10-CM

## 2022-02-15 DIAGNOSIS — I5032 Chronic diastolic (congestive) heart failure: Secondary | ICD-10-CM

## 2022-02-15 DIAGNOSIS — E782 Mixed hyperlipidemia: Secondary | ICD-10-CM

## 2022-02-18 ENCOUNTER — Ambulatory Visit: Payer: Medicare HMO | Admitting: Cardiology

## 2022-02-18 ENCOUNTER — Ambulatory Visit: Payer: Medicare HMO

## 2022-02-18 VITALS — BP 129/86 | HR 61 | Resp 16 | Ht 66.0 in | Wt 176.0 lb

## 2022-02-18 DIAGNOSIS — Z7901 Long term (current) use of anticoagulants: Secondary | ICD-10-CM

## 2022-02-18 DIAGNOSIS — I5032 Chronic diastolic (congestive) heart failure: Secondary | ICD-10-CM

## 2022-02-18 DIAGNOSIS — I10 Essential (primary) hypertension: Secondary | ICD-10-CM

## 2022-02-18 DIAGNOSIS — Z952 Presence of prosthetic heart valve: Secondary | ICD-10-CM

## 2022-02-18 DIAGNOSIS — I2510 Atherosclerotic heart disease of native coronary artery without angina pectoris: Secondary | ICD-10-CM

## 2022-02-18 DIAGNOSIS — Z951 Presence of aortocoronary bypass graft: Secondary | ICD-10-CM

## 2022-02-18 DIAGNOSIS — E782 Mixed hyperlipidemia: Secondary | ICD-10-CM

## 2022-02-18 DIAGNOSIS — I480 Paroxysmal atrial fibrillation: Secondary | ICD-10-CM

## 2022-02-18 MED ORDER — DAPAGLIFLOZIN PROPANEDIOL 10 MG PO TABS: 10.0000 mg | ORAL_TABLET | Freq: Every day | ORAL | 3 refills | Status: DC

## 2022-02-18 MED ORDER — SPIRONOLACTONE 25 MG PO TABS: 25.0000 mg | ORAL_TABLET | Freq: Every morning | ORAL | 0 refills | Status: DC

## 2022-02-18 MED ORDER — FUROSEMIDE 20 MG PO TABS: 20.0000 mg | ORAL_TABLET | Freq: Every day | ORAL | 3 refills | Status: DC | PRN

## 2022-02-18 NOTE — Progress Notes (Signed)
ID:  Keith Wagner, DOB 03/19/1941, MRN [id number]  PCP:  Keith Halim., PA-C  Cardiologist:  Keith Kras, DO, [facility] (established care 06/10/2019) Cardiothoracic surgeon: Dr. Roxan Wagner Former Cardiology Providers: Keith Lager, APRN, FNP-C  Date: 02/18/22 Last Office Visit: 05/17/2021  Chief Complaint  Patient presents with   Coronary Artery Disease   Congestive Heart Failure   Follow-up    6 month    HPI  Keith Wagner is a 81 y.o. 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 In September 2022 and underwent an echocardiogram which noted reduced LVEF along with grade 2 diastolic impairment and elevated left atrial pressure.  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  On March 01, 2021 he underwent two-vessel CABG (LIMA to the LAD and SVG to OM) and a bioprosthetic aortic valve with 21 mm Edwards Inspiris Resilia valve.  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  Postoperatively he also developed atrial fibrillation and has been on beta-blockers/amiodarone/oral anticoagulation given his CHA2DS2-VASc score.  Since discharge from the hospital on March 12, 2021 his medications have been uptitrated given his cardiomyopathy/CAD/heart failure.  He presents today for 25-monthfollow-up. At last visit he was stable from a cardiovascular  standpoint, therefore no changes were made.  Today he is doing well overall however he does note that he has had fluctuation in fluid status occurring 1-2 times per week.  He feels fluid buildup mostly in his abdominal area and some occasional lower leg edema.  Last week he did gain 4 pounds in a 24-hour period.  He is currently taking spironolactone 12.5 mg daily. He denies dyspnea with exertion, chest pain,current leg edema.  He is active and walks daily without issue.  He is following a low-sodium healthy diet.  Patient was also accompanied by his wife Keith Schmidwho provides collateral history as part of today's encounter.  ALLERGIES: No Known Allergies  MEDICATION LIST PRIOR TO VISIT: Current Meds  Medication Sig   acetaminophen (TYLENOL) 500 MG tablet Take 1,000 mg by mouth every 6 (six) hours as needed for moderate pain.   apixaban (ELIQUIS) 5 MG TABS tablet Take 1 tablet (5 mg total) by mouth 2 (two) times daily.   aspirin 81 MG tablet Take 81 mg by mouth daily.   atorvastatin (LIPITOR) 40 MG tablet Take 1 tablet (40 mg total) by mouth daily.   calcium carbonate (TUMS - DOSED IN MG ELEMENTAL CALCIUM) 500 MG chewable tablet Chew 500-1,000 mg by mouth daily as needed for indigestion or heartburn.   clobetasol cream (TEMOVATE) 06.64% Apply 1 application topically 2 (two) times daily as needed (eczema).   ENTRESTO 97-103 MG Take 1 tablet by mouth twice daily   ezetimibe (ZETIA) 10 MG tablet Take 1 tablet by mouth once daily   feeding supplement (ENSURE ENLIVE / ENSURE PLUS) LIQD Take 237 mLs by mouth 2 (two) times daily between meals.   furosemide (LASIX) 20 MG tablet Take 1 tablet (  20 mg total) by mouth daily as needed. Take 1 tablet as needed for weight gain of 1 pound or more in 24 hours or 5 pounds or more in 1 week   Lutein 6 MG TABS Take 6 mg by mouth daily.   MAGNESIUM-OXIDE 400 (240 Mg) MG tablet Take 1 tablet by mouth once daily   metoprolol succinate (TOPROL-XL) 25 MG 24 hr tablet  TAKE 1 TABLET BY MOUTH ONCE DAILY. HOLD IF TOP BLOOD PRESSURE NUMBER IS LESS THAN 100 MMHG OR PILSE LES THAN 60 BPM (Patient taking differently: Take 25 mg by mouth daily. HOLD IF TOP BLOOD PRESSURE NUMBER IS LESS THAN 100 MMHG OR PILSE LES THAN 60 BPM)   Multiple Vitamins-Minerals (MULTIVITAMIN PO) Take 1 tablet by mouth daily.   Omega-3 Fatty Acids (FISH OIL) 1000 MG CAPS Take 1,000 mg by mouth daily.   OVER THE COUNTER MEDICATION Take 1 tablet by mouth daily. Super Beta Prostate Advanced   triamcinolone cream (KENALOG) 0.1 % Apply 1 application topically 2 (two) times daily as needed (eczema).   [DISCONTINUED] FARXIGA 10 MG TABS tablet TAKE 1 TABLET BY MOUTH ONCE DAILY BEFORE BREAKFAST   [DISCONTINUED] spironolactone (ALDACTONE) 25 MG tablet Take 0.5 tablets (12.5 mg total) by mouth every morning.     PAST MEDICAL HISTORY: Past Medical History:  Diagnosis Date   Arthritis    Asthma    uses inhaler PRN   Bilateral carotid bruits 06/12/2018   Cataract    Diabetes mellitus without complication ([place])    per pt "pre-diabetic"- off Metformin- diet controlled    GERD (gastroesophageal reflux disease)    Gout    Heart murmur    Hypercholesteremia 06/12/2018   Hyperlipidemia    Hypertension    Mild aortic stenosis 06/12/2018    PAST SURGICAL HISTORY: Past Surgical History:  Procedure Laterality Date   AORTIC VALVE REPLACEMENT N/A 03/01/2021   Procedure: AORTIC VALVE REPLACEMENT (AVR) WITH INSPIRIS RESILIA  AORTIC VALVE SIZE 21MM;  Surgeon: [name], MD;  Location: [place];  Service: Open Heart Surgery;  Laterality: N/A;   CARDIAC CATHETERIZATION     CATARACT EXTRACTION Left    CHEST EXPLORATION  03/01/2021   Procedure: CHEST EXPLORATION;  Surgeon: [name], MD;  Location: [facility] OR;  Service: Open Heart Surgery;;   COLONOSCOPY     CORONARY ARTERY BYPASS GRAFT N/A 03/01/2021   Procedure: CORONARY ARTERY BYPASS GRAFTING (CABG) X 2 USING LEFT INTERNAL MAMMARY ARTERY AND  RIGHT ENDOSCOPIC GREATER SAPHENOUS VEIN CONDUITS;  Surgeon: [name], MD;  Location: [place];  Service: Open Heart Surgery;  Laterality: N/A;   ENDOVEIN HARVEST OF GREATER SAPHENOUS VEIN Right 03/01/2021   Procedure: ENDOVEIN HARVEST OF GREATER SAPHENOUS VEIN;  Surgeon: [name], MD;  Location: [place];  Service: Open Heart Surgery;  Laterality: Right;   EYE SURGERY     POLYPECTOMY     RIGHT HEART CATH AND CORONARY ANGIOGRAPHY N/A 02/06/2021   Procedure: RIGHT HEART CATH AND CORONARY ANGIOGRAPHY;  Surgeon: Nigel Mormon, MD;  Location: [place] [facility];  Service: Cardiovascular;  Laterality: N/A;   TEE WITHOUT CARDIOVERSION N/A 03/01/2021   Procedure: TRANSESOPHAGEAL ECHOCARDIOGRAM (TEE);  Surgeon: [name], MD;  Location: [place];  Service: Open Heart Surgery;  Laterality: N/A;   TEMPORARY PACEMAKER N/A 03/03/2021   Procedure: TEMPORARY PACEMAKER;  Surgeon: Adrian Prows, MD;  Location: [place] [facility];  Service: Cardiovascular;  Laterality: N/A;   THORACIC AORTOGRAM N/A 02/06/2021  Procedure: THORACIC AORTOGRAM;  Surgeon: Nigel Mormon, MD;  Location: [place] [facility];  Service: Cardiovascular;  Laterality: N/A;    FAMILY HISTORY: No family history of premature coronary disease or sudden cardiac death.  SOCIAL HISTORY:  The patient  reports that he quit smoking about 63 years ago. His smoking use included cigars. He has never used smokeless tobacco. He reports that he does not drink alcohol and does not use drugs.  REVIEW OF SYSTEMS: Review of Systems  Cardiovascular:  Positive for leg swelling (occasional). Negative for chest pain, claudication, dyspnea on exertion, orthopnea, palpitations, paroxysmal nocturnal dyspnea and syncope.  Respiratory:  Negative for shortness of breath.     PHYSICAL EXAM:    02/18/2022   10:28 AM 02/18/2022   10:21 AM 08/14/2021    3:00 PM  Vitals with BMI  Height  _0    Weight  176 lbs   BMI  53.79    Systolic [phone]  Diastolic 86 98 99  Pulse 61 63 59   Physical Exam Neck:     Vascular: No carotid bruit or JVD.  Cardiovascular:     Rate and Rhythm: Regular rhythm. Bradycardia present.     Pulses: Normal pulses.          Carotid pulses are 2+ on the right side and 2+ on the left side.      Radial pulses are 2+ on the right side and 2+ on the left side.       Dorsalis pedis pulses are 2+ on the right side and 2+ on the left side.     Heart sounds: Normal heart sounds. No murmur heard.    No gallop.  Pulmonary:     Effort: Pulmonary effort is normal.     Breath sounds: Normal breath sounds. No wheezing or rales.  Abdominal:     General: There is no distension.     Palpations: Abdomen is soft.     Tenderness: There is no abdominal tenderness.  Musculoskeletal:     Right lower leg: No edema.     Left lower leg: No edema.  Neurological:     Mental Status: He is alert.     CARDIAC DATABASE: CABG x2 on 03/01/2021: LIMA to LAD and SVG to OM and aortic valve replacement with a 21 mm Edwards InspirEase Resilia bioprosthetic valve.   Temporary Pacer Placement 03/03/2021.  EKG: EKG 02/18/2022: Normal sinus rhythm at rate of 64 bpm.  Left axis deviation.  Left anterior fascicular block.  Nonspecific T wave abnormality.  Old anterior infarct.  Compared to previous EKG on 08/14/2021, no significant change.  Echocardiogram: 05/25/2019: LVEF 92-95%, grade 1 diastolic impairment  74/73/4037: LVEF 30-35%, moderate LVH, global HK, grade 2 DD, elevated LAP, mild LAE, moderate AAS, moderate AR, moderate MR, mild TR, mild PHTN.  03/03/2021:  1. Left ventricular ejection fraction, by estimation, is 50 to 55%. Left ventricular ejection fraction by PLAX is 45 %. The left ventricle has low normal function. The left ventricle has no regional wall motion normalities. There is mild left ventricular hypertrophy. Left ventricular diastolic parameters are consistent with Grade II diastolic  dysfunction (pseudonormalization). Elevated left ventricular end-diastolic pressure.   2. Right ventricular systolic function is normal. The right ventricular size is normal.   3. Left atrial size was moderately dilated.   4. The mitral valve was not well visualized. Trivial mitral valve regurgitation.   5. The aortic valve was not well visualized. Aortic valve regurgitation  is not visualized. Mild aortic valve stenosis. There is a 21 mm Edwards InspirEase Resilia valve present in the aortic position. Aortic valve area, by VTI measures 0.98 cm.  Aortic valve mean gradient measures 17.5 mmHg. Aortic valve Vmax measures 2.77 m/s.   6. The inferior vena cava is dilated in size with <50% respiratory variability, suggesting right atrial pressure of 15 mmHg.    Stress Testing: Treadmill Exercise stress 11/11/2014: Indication: Diabetes, HTN The patient exercised according to [facility] Protocol, Total time recorded  6:00 min achieving max heart rate of  148 which was  100% of MPHR for age and  7.05 METS of work. Occasional PVC during exercise.  Normal BP response. Resting ECG showing NSR. There was no ST-T changes of ischemia with exercise stress test. No exercise induced arrythmias noted. Stress terminated due to [facility] met.  Heart Catheterization: LHC / RHC / Thoracic Aortogram 02/06/2021: LM: Distal calcific 75% stenosis LAD: Mid 80% stenosis LCX: large vessel. Ostial 70% stenosis.  RCA: Dominant, but small vessel. No significant disease seen on well opacified non-selective angiogram.   Normal PCW Unable to cross aortic valve due to severe aorta tortuosity.   CVTS consult for CABG +/- AVR  Carotid artery duplex 12/04/2017: Minimal stenosis in the right internal carotid artery (1-15%). Minimal stenosis in the left internal carotid artery (1-15%). Antegrade right vertebral artery flow. Antegrade left vertebral artery flow. Follow up when clinically indicated.  LABORATORY DATA:    Latest Ref Rng &  Units 09/24/2021    9:40 AM 04/16/2021   10:24 AM 03/09/2021    2:58 AM  CBC  WBC 3.4 - 10.8 x10E3/uL 8.6   10.2   Hemoglobin 13.0 - 17.7 g/dL 12.6  11.2  8.7   Hematocrit 37.5 - 51.0 % 41.3  37.8  28.0   Platelets 150 - 450 x10E3/uL 268   359        Latest Ref Rng & Units 09/24/2021    9:37 AM 06/28/2021    9:06 AM 06/19/2021    9:33 AM  CMP  Glucose 70 - 99 mg/dL 95  101  107   BUN 8 - 27 mg/dL 20  24  45   Creatinine 0.76 - 1.27 mg/dL 1.43  1.49  1.95   Sodium 134 - 144 mmol/L 137  137  136   Potassium 3.5 - 5.2 mmol/L 5.0  4.9  5.8   Chloride 96 - 106 mmol/L 104  101  99   CO2 20 - 29 mmol/L _0 Calcium 8.6 - 10.2 mg/dL 9.8  8.9  10.3   Total Protein 6.0 - 8.5 g/dL 6.2     Total Bilirubin 0.0 - 1.2 mg/dL 0.5     Alkaline Phos 44 - 121 IU/L 59     AST 0 - 40 IU/L 21     ALT 0 - 44 IU/L 19      Recent Labs    03/27/21 0939 04/16/21 1024 04/26/21 1037 05/25/21 0914 06/08/21 0833 06/19/21 0930 06/28/21 0903 09/24/21 0940  PROBNP 922* 739* 484 403 378 269 464 344   Recent Labs    03/11/21 1246  TSH 8.253*    BMP Recent Labs    03/10/21 0113 03/11/21 0059 03/12/21 0410 03/27/21 0939 06/19/21 0933 06/28/21 0906 09/24/21 0937  NA 135 137 135   < > 136 137 137  K 3.9 4.1 4.2   < > 5.8* 4.9 5.0  CL 102 104 104   < >  99 101 104  CO2 _0 < > _1 GLUCOSE 109* 99 99   < > 107* 101* 95  BUN 29* 27* 26*   < > 45* 24 20  CREATININE 1.52* 1.41* 1.41*   < > 1.95* 1.49* 1.43*  CALCIUM 8.2* 8.4* 8.3*   < > 10.3* 8.9 9.8  GFRNONAA 46* 51* 51*  --   --   --   --    < > = values in this interval not displayed.    HEMOGLOBIN A1C Lab Results  Component Value Date   HGBA1C 6.3 (H) 02/27/2021   MPG 134.11 02/27/2021    External Labs:  01/17/2022: Sodium 136, potassium 4.5, BUN 26, creatinine 1.55, glucose 101 Hemoglobin 14.6, hematocrit 45.6, MCV 81.2, platelets 262 Cholesterol 136, triglycerides 166, HDL 42, LDL 71 Hemoglobin A1c  6.3%  IMPRESSION:    ICD-10-CM   1. Coronary artery disease involving native coronary artery of native heart without angina pectoris  I25.10 EKG 12-Lead    spironolactone (ALDACTONE) 25 MG tablet    2. S/P CABG x 2  Z95.1 spironolactone (ALDACTONE) 25 MG tablet    3. Chronic diastolic congestive heart failure, NYHA class 2 (HCC)  I50.32 spironolactone (ALDACTONE) 25 MG tablet    PCV ECHOCARDIOGRAM COMPLETE    4. S/P Bioprosthetic AVR 21 mm Edward InspirEase Resilia  Z95.2     5. Long term (current) use of anticoagulants  Z79.01     6. Paroxysmal atrial fibrillation (HCC)  I48.0     7. Benign essential hypertension  I10     8. Mixed hyperlipidemia  E78.2 spironolactone (ALDACTONE) 25 MG tablet      RECOMMENDATIONS: Keith Wagner is a 81 y.o. 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  Chronic diastolic congestive heart failure, NYHA class 2 (HCC) Euvolemic. Stage C, NYHA class II. Will increase spironolactone to 25 mg daily given home weight gain and concerns for fluid fluctuation.  We will also order Lasix 20 mg to be taken on an as-needed basis. Patient advised to supplement his diet with a banana for the days he takes Lasix for potassium hemostasis. Outside labs from October 2023 independently reviewed. EKG ordered and independently reviewed which illustrates sinus bradycardia. Will schedule for an echocardiogram.  Coronary artery disease involving native coronary artery of native heart without angina pectoris; S/P CABG x 2 Denies angina pectoris. Medications reconciled. Educated the importance of secondary prevention. Currently  on atorvastatin 40 mg p.o. nightly and Zetia 58m nightly. Recommend a goal LDL less than 55 mg/dL if able to tolerate.  S/P Bioprosthetic AVR 21 mm Edward InspirEase Resilia Fingerprint echo March 03, 2021. Monitor for now.  Paroxysmal atrial fibrillation ([place] Discovered postop December 2022. Rate control: Toprol-XL. Rhythm control: N/A (history of amiodarone). Thromboembolic prophylaxis: Eliquis EKG today notes sinus bradycardia.  Long term (current) use of anticoagulants Indication: Paroxysmal atrial fibrillation. Outside labs independently reviewed, hemoglobin almost back to baseline. Does not endorse evidence of bleeding. CHA2DS2-VASc SCORE is 5 which correlates to 7.2% risk of stroke per year (age, CAD, CHF, HTN).  Discussed the risks, benefits, and alternatives to oral anticoagulation.  Both patient and wife would like to continue  oral anticoagulation for thromboembolic prophylaxis.  Benign essential hypertension Blood pressures well controlled.  He does continue to monitor his blood pressure at home and home readings are also well controlled. Continue current medications.  Mixed hyperlipidemia Currently on atorvastatin 40 mg p.o. nightly. Most recent lipid profile reviewed. LDL 71, goal 55. Discussed importance of dietary changes and exercise as tolerated.   FINAL MEDICATION LIST END OF ENCOUNTER: Meds ordered this encounter  Medications   dapagliflozin propanediol (FARXIGA) 10 MG TABS tablet    Sig: Take 1 tablet (10 mg total) by mouth daily before breakfast.    Dispense:  90 tablet    Refill:  3    Order Specific Question:   Supervising Provider    Answer:   Adrian Prows [[id number]]   spironolactone (ALDACTONE) 25 MG tablet    Sig: Take 1 tablet (25 mg total) by mouth every morning.    Dispense:  180 tablet    Refill:  0    Order Specific Question:   Supervising Provider    Answer:   Adrian Prows [[id number]]   furosemide (LASIX) 20 MG tablet    Sig: Take 1 tablet (20 mg  total) by mouth daily as needed. Take 1 tablet as needed for weight gain of 1 pound or more in 24 hours or 5 pounds or more in 1 week    Dispense:  30 tablet    Refill:  3    Order Specific Question:   Supervising Provider    Answer:   Adrian Prows [[id number]]     Medications Discontinued During This Encounter  Medication Reason   spironolactone (ALDACTONE) 25 MG tablet Reorder   FARXIGA 10 MG TABS tablet Reorder      Current Outpatient Medications:    acetaminophen (TYLENOL) 500 MG tablet, Take 1,000 mg by mouth every 6 (six) hours as needed for moderate pain., Disp: , Rfl:    apixaban (ELIQUIS) 5 MG TABS tablet, Take 1 tablet (5 mg total) by mouth 2 (two) times daily., Disp: 60 tablet, Rfl: 3   aspirin 81 MG tablet, Take 81 mg by mouth daily., Disp: , Rfl:    atorvastatin (LIPITOR) 40 MG tablet, Take 1 tablet (40 mg total) by mouth daily., Disp: 30 tablet, Rfl: 3   calcium carbonate (TUMS - DOSED IN MG ELEMENTAL CALCIUM) 500 MG chewable tablet, Chew 500-1,000 mg by mouth daily as needed for indigestion or heartburn., Disp: , Rfl:    clobetasol cream (TEMOVATE) 2.53 %, Apply 1 application topically 2 (two) times daily as needed (eczema)., Disp: , Rfl:    ENTRESTO 97-103 MG, Take 1 tablet by mouth twice daily, Disp: 180 tablet, Rfl: 0   ezetimibe (ZETIA) 10 MG tablet, Take 1 tablet by mouth once daily, Disp: 90 tablet, Rfl: 0   feeding supplement (ENSURE ENLIVE / ENSURE PLUS) LIQD, Take 237 mLs by mouth 2 (two) times daily between meals., Disp: 237 mL, Rfl: 12   furosemide (LASIX) 20 MG tablet, Take 1 tablet (20 mg total) by mouth daily as needed. Take 1 tablet as needed for weight gain of 1 pound or more in 24 hours or 5 pounds or more in 1 week, Disp: 30 tablet, Rfl: 3   Lutein 6 MG TABS, Take 6 mg by mouth daily., Disp: , Rfl:    MAGNESIUM-OXIDE 400 (240 Mg) MG tablet, Take 1 tablet by mouth once daily, Disp: 90 tablet, Rfl: 1   metoprolol succinate (TOPROL-XL) 25 MG 24 hr tablet, TAKE 1  TABLET  BY MOUTH ONCE DAILY. HOLD IF TOP BLOOD PRESSURE NUMBER IS LESS THAN 100 MMHG OR PILSE LES THAN 60 BPM (Patient taking differently: Take 25 mg by mouth daily. HOLD IF TOP BLOOD PRESSURE NUMBER IS LESS THAN 100 MMHG OR PILSE LES THAN 60 BPM), Disp: 30 tablet, Rfl: 3   Multiple Vitamins-Minerals (MULTIVITAMIN PO), Take 1 tablet by mouth daily., Disp: , Rfl:    Omega-3 Fatty Acids (FISH OIL) 1000 MG CAPS, Take 1,000 mg by mouth daily., Disp: , Rfl:    OVER THE COUNTER MEDICATION, Take 1 tablet by mouth daily. Super Beta Prostate Advanced, Disp: , Rfl:    triamcinolone cream (KENALOG) 0.1 %, Apply 1 application topically 2 (two) times daily as needed (eczema)., Disp: , Rfl:    dapagliflozin propanediol (FARXIGA) 10 MG TABS tablet, Take 1 tablet (10 mg total) by mouth daily before breakfast., Disp: 90 tablet, Rfl: 3   levothyroxine (SYNTHROID) 25 MCG tablet, Take 25 mcg by mouth daily., Disp: , Rfl:    spironolactone (ALDACTONE) 25 MG tablet, Take 1 tablet (25 mg total) by mouth every morning., Disp: 180 tablet, Rfl: 0  Orders Placed This Encounter  Procedures   EKG 12-Lead   PCV ECHOCARDIOGRAM COMPLETE    There are no Patient Instructions on file for this visit.   --Continue cardiac medications as reconciled in final medication list. --Return in about 6 months (around 08/19/2022) for CHF, CAD, HTN, HLD. Or sooner if needed. --Continue follow-up with your primary care physician regarding the management of your other chronic comorbid conditions.  Patient's questions and concerns were addressed to his satisfaction. He voices understanding of the instructions provided during this encounter.   This note was created using a voice recognition software as a result there may be grammatical errors inadvertently enclosed that do not reflect the nature of this encounter. Every attempt is made to correct such errors.   Ernst Spell, [place] Office: [phone] Pager: [phone]

## 2022-02-27 ENCOUNTER — Ambulatory Visit: Payer: Medicare HMO

## 2022-02-27 DIAGNOSIS — I5032 Chronic diastolic (congestive) heart failure: Secondary | ICD-10-CM

## 2022-02-27 DIAGNOSIS — Z952 Presence of prosthetic heart valve: Secondary | ICD-10-CM

## 2022-03-11 ENCOUNTER — Other Ambulatory Visit: Payer: Self-pay | Admitting: Cardiology

## 2022-03-11 DIAGNOSIS — I480 Paroxysmal atrial fibrillation: Secondary | ICD-10-CM

## 2022-04-15 ENCOUNTER — Other Ambulatory Visit: Payer: Self-pay | Admitting: Cardiology

## 2022-04-29 ENCOUNTER — Other Ambulatory Visit: Payer: Self-pay | Admitting: Cardiology

## 2022-04-29 DIAGNOSIS — E782 Mixed hyperlipidemia: Secondary | ICD-10-CM

## 2022-04-29 DIAGNOSIS — I2510 Atherosclerotic heart disease of native coronary artery without angina pectoris: Secondary | ICD-10-CM

## 2022-04-29 DIAGNOSIS — Z951 Presence of aortocoronary bypass graft: Secondary | ICD-10-CM

## 2022-07-15 ENCOUNTER — Other Ambulatory Visit: Payer: Self-pay | Admitting: Cardiology

## 2022-07-15 DIAGNOSIS — I5041 Acute combined systolic (congestive) and diastolic (congestive) heart failure: Secondary | ICD-10-CM

## 2022-07-22 NOTE — Progress Notes (Signed)
Averages January  2024 Summary Average Systolic BP Level 109.24 mmHg Lowest Systolic BP Level 90 mmHg Highest Systolic BP Level 129 mmHg  Feb 2024 Systolic Blood Pressure 111.6 (98.0 - 131.0) Diastolic Blood Pressure 74.4 (98.1 - 90.0) Heart Rate  68.4 (50.0 - 83.0)  Mar 2024 Systolic Blood Pressure 111.2 (93.0 - 145.0) Diastolic Blood Pressure 74.1 (19.1 - 100.0) Heart Rate  67.4 (42.0 - 86.0)  April 2024 Systolic mmHg--107.4 (90.0 - 128.0)  Diastolic mmHg--72.1 (56.0 - 85.0) Heart rate bpm--69.6 (49.0 - 85.0)  Date Systolic Diastolic Units Heart Rate Units 04/25/22 9:30 AM 102 75 mmHg 61 bpm 04/26/22 9:42 AM 107 62 mmHg 62 bpm 04/27/22 9:48 AM 131 90 mmHg 65 bpm 04/28/22 10:34 AM 125 82 mmHg 64 bpm 04/29/22 9:42 AM 117 74 mmHg 64 bpm 04/30/22 9:45 AM 128 84 mmHg 68 bpm 05/01/22 9:43 AM 107 70 mmHg 52 bpm 05/02/22 9:39 AM 99 72 mmHg 63 bpm 05/03/22 9:54 AM 116 75 mmHg 67 bpm 05/04/22 10:04 AM 110 67 mmHg 72 bpm 05/05/22 10:07 AM 114 76 mmHg 66 bpm 05/06/22 10:50 AM 124 87 mmHg 67 bpm 05/07/22 9:40 AM 106 76 mmHg 72 bpm 05/08/22 9:59 AM 102 68 mmHg 58 bpm 05/09/22 9:00 AM 113 73 mmHg 71 bpm 05/10/22 9:33 AM 106 72 mmHg 73 bpm 05/11/22 10:23 AM 117 80 mmHg 70 bpm 05/12/22 9:27 AM 101 66 mmHg 76 bpm 05/13/22 9:27 AM 117 78 mmHg 77 bpm 05/14/22 9:42 AM 110 87 mmHg 50 bpm 05/15/22 9:44 AM 105 67 mmHg 79 bpm 05/16/22 7:19 AM 109 73 mmHg 78 bpm 05/17/22 9:45 AM 111 74 mmHg 75 bpm 05/18/22 9:59 AM 98 63 mmHg 67 bpm 05/19/22 8:52 AM 121 77 mmHg 83 bpm 05/20/22 9:30 AM 122 80 mmHg 67 bpm 05/21/22 9:42 AM 108 69 mmHg 77 bpm 05/22/22 9:39 AM 98 65 mmHg 71 bpm 05/23/22 9:20 AM 116 73 mmHg 70 bpm 05/24/22 9:35 AM 114 78 mmHg 65 bpm 05/25/22 9:37 AM 106 72 mmHg 71 bpm 05/26/22 9:22 AM 105 82 mmHg 64 bpm 05/27/22 9:26 AM 125 88 mmHg 50 bpm 05/28/22 9:27 AM 113 71 mmHg 66 bpm 05/29/22 9:24 AM 114 78 mmHg 70 bpm 05/30/22 10:12 AM 97 55 mmHg 42 bpm 05/30/22 10:14 AM 101 64 mmHg 86 bpm 05/31/22 9:43 AM 108 71 mmHg 67 bpm 06/01/22  9:38 AM 113 73 mmHg 68 bpm 06/02/22 9:32 AM 103 66 mmHg 68 bpm 06/03/22 9:28 AM 100 71 mmHg 67 bpm 06/04/22 9:27 AM 120 80 mmHg 62 bpm 06/05/22 11:54 AM 110 89 mmHg 67 bpm 06/06/22 9:53 AM 118 76 mmHg 69 bpm 06/07/22 9:33 AM 104 64 mmHg 70 bpm 06/08/22 9:43 AM 95 64 mmHg 73 bpm 06/09/22 9:26 AM 99 71 mmHg 68 bpm 06/10/22 9:15 AM 116 78 mmHg 77 bpm 06/11/22 2:08 PM 139 85 mmHg 43 bpm 06/12/22 9:59 AM 101 61 mmHg 70 bpm 06/13/22 9:31 AM 125 79 mmHg 71 bpm 06/14/22 9:41 AM 104 72 mmHg 73 bpm 06/15/22 9:45 AM 114 78 mmHg 68 bpm 06/16/22 9:29 AM 93 68 mmHg 60 bpm 06/17/22 9:54 AM 105 58 mmHg 69 bpm 06/18/22 9:35 AM 114 76 mmHg 68 bpm 06/19/22 9:43 AM 145 100 mmHg 65 bpm 06/20/22 12:07 PM 114 73 mmHg 74 bpm 06/21/22 9:46 AM 92 59 mmHg 64 bpm 06/22/22 9:28 AM 90 56 mmHg 69 bpm 06/23/22 9:20 AM 125 80 mmHg 70 bpm 06/24/22 9:39 AM 128 85 mmHg 71 bpm 06/25/22 9:27 AM 110  71 mmHg 67 bpm 06/26/22 2:42 PM 110 71 mmHg 70 bpm 06/27/22 12:59 PM 95 69 mmHg 77 bpm 06/28/22 9:36 AM 105 68 mmHg 77 bpm 06/29/22 10:05 AM 118 78 mmHg 67 bpm 06/30/22 9:14 AM 91 67 mmHg 66 bpm 07/01/22 9:56 AM 116 76 mmHg 68 bpm 07/02/22 9:25 AM 109 77 mmHg 68 bpm 07/03/22 9:33 AM 113 74 mmHg 73 bpm 07/04/22 9:26 AM 106 71 mmHg 76 bpm 07/05/22 9:31 AM 106 69 mmHg 84 bpm 07/06/22 9:37 AM 108 75 mmHg 67 bpm 07/07/22 9:26 AM 105 73 mmHg 64 bpm 07/08/22 9:31 AM 99 64 mmHg 72 bpm 07/09/22 9:04 AM 107 69 mmHg 67 bpm 07/10/22 9:38 AM 109 69 mmHg 70 bpm 07/11/22 8:51 AM 115 74 mmHg 66 bpm 07/12/22 9:14 AM 110 75 mmHg 68 bpm 07/13/22 9:45 AM 96 73 mmHg 71 bpm 07/14/22 9:17 AM 106 74 mmHg 68 bpm 07/15/22 9:27 AM 111 72 mmHg 64 bpm 07/16/22 9:37 AM 96 73 mmHg 67 bpm 07/17/22 9:32 AM 97 71 mmHg 60 bpm 07/18/22 8:25 AM 114 73 mmHg 76 bpm 07/19/22 9:46 AM 104 74 mmHg 49 bpm 07/20/22 10:21 AM 98 66 mmHg 75 bpm 07/21/22 8:54 AM 113 71 mmHg 74 bpm 07/22/22 11:14 AM 111 72 mmHg 85 bpm

## 2022-08-05 ENCOUNTER — Other Ambulatory Visit: Payer: Self-pay | Admitting: Cardiology

## 2022-08-05 DIAGNOSIS — Z951 Presence of aortocoronary bypass graft: Secondary | ICD-10-CM

## 2022-08-05 DIAGNOSIS — I2510 Atherosclerotic heart disease of native coronary artery without angina pectoris: Secondary | ICD-10-CM

## 2022-08-05 DIAGNOSIS — E782 Mixed hyperlipidemia: Secondary | ICD-10-CM

## 2022-08-15 ENCOUNTER — Other Ambulatory Visit: Payer: Self-pay | Admitting: Cardiology

## 2022-08-15 DIAGNOSIS — Z951 Presence of aortocoronary bypass graft: Secondary | ICD-10-CM

## 2022-08-15 DIAGNOSIS — I5032 Chronic diastolic (congestive) heart failure: Secondary | ICD-10-CM

## 2022-08-15 DIAGNOSIS — I2510 Atherosclerotic heart disease of native coronary artery without angina pectoris: Secondary | ICD-10-CM

## 2022-08-15 DIAGNOSIS — E782 Mixed hyperlipidemia: Secondary | ICD-10-CM

## 2022-08-15 MED ORDER — SPIRONOLACTONE 25 MG PO TABS
25.0000 mg | ORAL_TABLET | Freq: Every morning | ORAL | 3 refills | Status: DC
Start: 2022-08-15 — End: 2022-08-27

## 2022-08-16 ENCOUNTER — Encounter: Payer: Self-pay | Admitting: Cardiology

## 2022-08-16 ENCOUNTER — Ambulatory Visit: Payer: Medicare HMO | Admitting: Cardiology

## 2022-08-16 ENCOUNTER — Telehealth: Payer: Self-pay

## 2022-08-16 ENCOUNTER — Ambulatory Visit: Payer: Medicare HMO

## 2022-08-16 VITALS — BP 111/84 | HR 55 | Resp 16 | Ht 66.0 in | Wt 173.0 lb

## 2022-08-16 DIAGNOSIS — Z7901 Long term (current) use of anticoagulants: Secondary | ICD-10-CM

## 2022-08-16 DIAGNOSIS — I5032 Chronic diastolic (congestive) heart failure: Secondary | ICD-10-CM

## 2022-08-16 DIAGNOSIS — Z951 Presence of aortocoronary bypass graft: Secondary | ICD-10-CM

## 2022-08-16 DIAGNOSIS — E782 Mixed hyperlipidemia: Secondary | ICD-10-CM

## 2022-08-16 DIAGNOSIS — I10 Essential (primary) hypertension: Secondary | ICD-10-CM

## 2022-08-16 DIAGNOSIS — I480 Paroxysmal atrial fibrillation: Secondary | ICD-10-CM

## 2022-08-16 DIAGNOSIS — Z952 Presence of prosthetic heart valve: Secondary | ICD-10-CM

## 2022-08-16 DIAGNOSIS — Z87891 Personal history of nicotine dependence: Secondary | ICD-10-CM

## 2022-08-16 MED ORDER — ENTRESTO 49-51 MG PO TABS
1.0000 | ORAL_TABLET | Freq: Two times a day (BID) | ORAL | 0 refills | Status: DC
Start: 2022-08-16 — End: 2022-08-27

## 2022-08-16 NOTE — Telephone Encounter (Signed)
Reviewed at today's visit.   Trystin Terhune [place], DO, [facility]

## 2022-08-16 NOTE — Telephone Encounter (Signed)
Patient's BP/HR is well controlled on current regimen. Patient does not report any complaints at this time. Spironolactone just refilled yesterday per request.  30-day BP avg:117/77 mmHg 30-day HR avg: 76 bpm    08/15/22 9:24 AM 100 / 75 77     08/14/22 9:14 AM 111 / 72 72     08/13/22 10:40 AM 112 / 74 88     08/12/22 9:32 AM 110 / 66 68     08/11/22 9:26 AM 119 / 75 73     08/10/22 9:49 AM 106 / 69 77     08/09/22 9:47 AM 93 / 73 65     08/08/22 10:05 AM 117 / 73 73    08/07/22 9:35 AM 107 / 64 73     08/06/22 9:19 AM 109 / 72 71     08/05/22 9:24 AM 106 / 69 68     08/04/22 9:09 AM 113 / 76 71     08/03/22 9:36 AM 104 / 75 59     08/02/22 9:56 AM 94 / 66 77     08/01/22 9:21 AM 116 / 81 94     07/31/22 9:27 AM 116 / 79 78    07/30/22 9:07 AM 91 / 66 74    07/29/22 9:18 AM 105 / 73 75    07/28/22 10:05 AM 117 / 76 69   07/27/22 9:50 AM 100 / 68 63

## 2022-08-16 NOTE — Progress Notes (Signed)
ID:  YESHAYAHU MYRON, DOB 05-Jul-1940, MRN [id number]  PCP:  Keith Wagner., PA-C  Cardiologist:  Keith Lerner, DO, [facility] (established care 06/10/2019) Cardiothoracic surgeon: Dr. Dorris Wagner Former Cardiology Providers: Keith [place], APRN, FNP-C  Date: 08/16/22 Last Office Visit: Aug 14, 2021  Chief Complaint  Patient presents with   Congestive Heart Failure   Coronary Artery Disease   Hypertension   Hyperlipidemia   Follow-up    6 month    HPI  Keith Wagner is a 82 y.o. 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   In September 2022 and underwent an echocardiogram which noted reduced LVEF along with grade 2 diastolic impairment and elevated left atrial pressure.  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  On March 01, 2021 he underwent two-vessel CABG (LIMA to the LAD and SVG to OM) and a bioprosthetic aortic valve with 21 mm Edwards Inspiris Resilia valve.  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  Patient presents today for 1 year follow-up visit given his history of CAD status post CABG, aortic valve replacement, PAF.  No hospitalizations or urgent care visits.  He denies anginal chest pain or heart failure symptoms.  He still requires Lasix on as needed basis twice a week.  Ambulatory blood pressure readings reviewed, many of them are less than 100  mmHg.  He denies lightheaded or dizziness.  He is requesting patient assistance with [name], Eliquis.  No recent labs available for review.  Patient was also accompanied by his wife Keith Wagner who provides collateral history as part of today's encounter.  ALLERGIES: No Known Allergies  MEDICATION LIST PRIOR TO VISIT: Current Meds  Medication Sig   acetaminophen (TYLENOL) 500 MG tablet Take 1,000 mg by mouth every 6 (six) hours as needed for moderate pain.   apixaban (ELIQUIS) 5 MG TABS tablet Take 1 tablet by mouth twice daily   aspirin 81 MG tablet Take 81 mg by mouth daily.   atorvastatin (LIPITOR) 40 MG tablet Take 1 tablet (40 mg total) by mouth daily.   calcium carbonate (TUMS - DOSED IN MG ELEMENTAL CALCIUM) 500 MG chewable tablet Chew 500-1,000 mg by mouth daily as needed for indigestion or heartburn.   clobetasol cream (TEMOVATE) 0.05 % Apply 1 application topically 2 (two) times daily as needed (eczema).   dapagliflozin propanediol (FARXIGA) 10 MG TABS tablet Take 1 tablet (10 mg total) by mouth daily before breakfast.   ezetimibe (ZETIA) 10 MG tablet Take 1 tablet by mouth once daily   furosemide (LASIX) 20 MG tablet Take 1 tablet (20 mg total) by mouth daily as needed. Take 1 tablet as needed for weight gain of 1 pound or more in 24 hours or 5 pounds or more in 1 week   levothyroxine (SYNTHROID) 25 MCG tablet Take 25 mcg by mouth daily.   Lutein 6 MG TABS Take 6 mg by mouth daily.   MAGNESIUM-OXIDE 400 (240 Mg) MG tablet Take 1 tablet  by mouth once daily   metoprolol succinate (TOPROL-XL) 25 MG 24 hr tablet TAKE 1 TABLET BY MOUTH ONCE DAILY. HOLD IF TOP BLOOD PRESSURE NUMBER IS LESS THAN 100 MMHG OR PILSE LES THAN 60 BPM (Patient taking differently: Take 25 mg by mouth daily. HOLD IF TOP BLOOD PRESSURE NUMBER IS LESS THAN 100 MMHG OR PILSE LES THAN 60 BPM)   Multiple Vitamins-Minerals (MULTIVITAMIN PO) Take 1 tablet by mouth daily.   Omega-3 Fatty Acids (FISH OIL) 1000  MG CAPS Take 1,000 mg by mouth daily.   OVER THE COUNTER MEDICATION Take 1 tablet by mouth daily. Super Beta Prostate Advanced   sacubitril-valsartan (ENTRESTO) 49-51 MG Take 1 tablet by mouth 2 (two) times daily.   spironolactone (ALDACTONE) 25 MG tablet Take 1 tablet (25 mg total) by mouth every morning.   [DISCONTINUED] ENTRESTO 97-103 MG Take 1 tablet by mouth twice daily     PAST MEDICAL HISTORY: Past Medical History:  Diagnosis Date   Arthritis    Asthma    uses inhaler PRN   Bilateral carotid bruits 06/12/2018   Cataract    Diabetes mellitus without complication (HCC)    per pt "pre-diabetic"- off Metformin- diet controlled    GERD (gastroesophageal reflux disease)    Gout    Heart murmur    Hypercholesteremia 06/12/2018   Hyperlipidemia    Hypertension    Mild aortic stenosis 06/12/2018    PAST SURGICAL HISTORY: Past Surgical History:  Procedure Laterality Date   AORTIC VALVE REPLACEMENT N/A 03/01/2021   Procedure: AORTIC VALVE REPLACEMENT (AVR) WITH INSPIRIS RESILIA  AORTIC VALVE SIZE ;  Surgeon: Loreli Slot, MD;  Location: [facility] OR;  Service: Open Heart Surgery;  Laterality: N/A;   CARDIAC CATHETERIZATION     CATARACT EXTRACTION Left    CHEST EXPLORATION  03/01/2021   Procedure: CHEST EXPLORATION;  Surgeon: Loreli Slot, MD;  Location: [facility] OR;  Service: Open Heart Surgery;;   COLONOSCOPY     CORONARY ARTERY BYPASS GRAFT N/A 03/01/2021   Procedure: CORONARY ARTERY BYPASS GRAFTING (CABG) X 2 USING LEFT INTERNAL MAMMARY ARTERY AND RIGHT ENDOSCOPIC GREATER SAPHENOUS VEIN CONDUITS;  Surgeon: Loreli Slot, MD;  Location: [facility] OR;  Service: Open Heart Surgery;  Laterality: N/A;   ENDOVEIN HARVEST OF GREATER SAPHENOUS VEIN Right 03/01/2021   Procedure: ENDOVEIN HARVEST OF GREATER SAPHENOUS VEIN;  Surgeon: Loreli Slot, MD;  Location: [facility] OR;  Service: Open Heart Surgery;  Laterality: Right;   EYE SURGERY     POLYPECTOMY     RIGHT HEART CATH AND  CORONARY ANGIOGRAPHY N/A 02/06/2021   Procedure: RIGHT HEART CATH AND CORONARY ANGIOGRAPHY;  Surgeon: Elder Negus, MD;  Location: [facility] INVASIVE CV LAB;  Service: Cardiovascular;  Laterality: N/A;   TEE WITHOUT CARDIOVERSION N/A 03/01/2021   Procedure: TRANSESOPHAGEAL ECHOCARDIOGRAM (TEE);  Surgeon: Loreli Slot, MD;  Location: [facility] OR;  Service: Open Heart Surgery;  Laterality: N/A;   TEMPORARY PACEMAKER N/A 03/03/2021   Procedure: TEMPORARY PACEMAKER;  Surgeon: Yates Decamp, MD;  Location: [facility] INVASIVE CV LAB;  Service: Cardiovascular;  Laterality: N/A;   THORACIC AORTOGRAM N/A 02/06/2021   Procedure: THORACIC AORTOGRAM;  Surgeon: Elder Negus, MD;  Location: [facility] INVASIVE CV LAB;  Service: Cardiovascular;  Laterality: N/A;    FAMILY HISTORY: No family history of premature coronary disease or sudden cardiac death.  SOCIAL HISTORY:  The patient  reports that he quit smoking about 64 years ago. His smoking use included cigars. He has never  used smokeless tobacco. He reports that he does not drink alcohol and does not use drugs.  REVIEW OF SYSTEMS: Review of Systems  Cardiovascular:  Negative for chest pain, claudication, dyspnea on exertion, irregular heartbeat, leg swelling, near-syncope, orthopnea, palpitations, paroxysmal nocturnal dyspnea and syncope.  Respiratory:  Negative for shortness of breath.   Hematologic/Lymphatic: Negative for bleeding problem.  Musculoskeletal:  Negative for muscle cramps and myalgias.  Neurological:  Negative for dizziness and light-headedness.    PHYSICAL EXAM:    08/16/2022    9:53 AM 02/18/2022   10:28 AM 02/18/2022   10:21 AM  Vitals with BMI  Height 5\' 6"   5\' 6"   Weight 173 lbs  176 lbs  BMI 27.94  28.42  Systolic 111 129 409  Diastolic 84 86 98  Pulse 55 61 63   Physical Exam Neck:     Vascular: No carotid bruit or JVD.  Cardiovascular:     Rate and Rhythm: Normal rate and regular rhythm.     Pulses: Normal pulses.           Carotid pulses are 2+ on the right side and 2+ on the left side.      Radial pulses are 2+ on the right side and 2+ on the left side.       Dorsalis pedis pulses are 2+ on the right side and 2+ on the left side.     Heart sounds: Normal heart sounds. No murmur heard.    No gallop.  Pulmonary:     Effort: Pulmonary effort is normal.     Breath sounds: Normal breath sounds. No wheezing or rales.  Abdominal:     General: There is no distension.     Palpations: Abdomen is soft.     Tenderness: There is no abdominal tenderness.  Musculoskeletal:     Right lower leg: No edema.     Left lower leg: No edema.  Neurological:     Mental Status: He is alert.     CARDIAC DATABASE: CABG x2 on 03/01/2021: LIMA to LAD and SVG to OM and aortic valve replacement with a 21 mm Edwards InspirEase Resilia bioprosthetic valve.   Temporary Pacer Placement 03/03/2021.  EKG: Aug 16, 2022: Sinus bradycardia, 57 bpm, low voltage in precordial leads, left axis, left anterior fascicular block, without underlying injury pattern.  Echocardiogram: 05/25/2019: LVEF 50-55%, grade 1 diastolic impairment  12/07/2020: LVEF 30-35%, moderate LVH, global HK, grade 2 DD, elevated LAP, mild LAE, moderate AAS, moderate AR, moderate MR, mild TR, mild PHTN.  03/03/2021:  1. Left ventricular ejection fraction, by estimation, is 50 to 55%. Left ventricular ejection fraction by PLAX is 45 %. The left ventricle has low normal function. The left ventricle has no regional wall motion normalities. There is mild left ventricular hypertrophy. Left ventricular diastolic parameters are consistent with Grade II diastolic dysfunction (pseudonormalization). Elevated left ventricular end-diastolic pressure.   2. Right ventricular systolic function is normal. The right ventricular size is normal.   3. Left atrial size was moderately dilated.   4. The mitral valve was not well visualized. Trivial mitral valve regurgitation.   5. The  aortic valve was not well visualized. Aortic valve regurgitation is not visualized. Mild aortic valve stenosis. There is a 21 mm Edwards InspirEase Resilia valve present in the aortic position. Aortic valve area, by VTI measures 0.98 cm.  Aortic valve mean gradient measures 17.5 mmHg. Aortic valve Vmax measures 2.77 m/s.   6. The inferior vena cava is  dilated in size with <50% respiratory variability, suggesting right atrial pressure of 15 mmHg.    Stress Testing: Treadmill Exercise stress 11/11/2014: Indication: Diabetes, HTN The patient exercised according to Bruce Protocol, Total time recorded  6:00 min achieving max heart rate of  148 which was  100% of MPHR for age and  7.05 METS of work. Occasional PVC during exercise.  Normal BP response. Resting ECG showing NSR. There was no ST-T changes of ischemia with exercise stress test. No exercise induced arrythmias noted. Stress terminated due to [facility] met.  Heart Catheterization: LHC / RHC / Thoracic Aortogram 02/06/2021: LM: Distal calcific 75% stenosis LAD: Mid 80% stenosis LCX: large vessel. Ostial 70% stenosis.  RCA: Dominant, but small vessel. No significant disease seen on well opacified non-selective angiogram.   Normal PCW Unable to cross aortic valve due to severe aorta tortuosity.   CVTS consult for CABG +/- AVR  Carotid artery duplex 12/04/2017: Minimal stenosis in the right internal carotid artery (1-15%). Minimal stenosis in the left internal carotid artery (1-15%). Antegrade right vertebral artery flow. Antegrade left vertebral artery flow. Follow up when clinically indicated.  AMBULATORY BLOOD PRESSURE MONITORING: 30-day BP avg:117/77 mmHg 30-day HR avg: 76 bpm     08/15/22 9:24 AM         100      /           75        77                                 08/14/22 9:14 AM         111      /           72        72                                 08/13/22 10:40 AM       112      /           74        88                                  08/12/22 9:32 AM         110      /           66        68                                 08/11/22 9:26 AM         119      /           75        73                                 08/10/22 9:49 AM         106      /           69        77  08/09/22 9:47 AM         93        /           73        65                                 08/08/22 10:05 AM       117      /           73        73                     08/07/22 9:35 AM         107      /           64        73                                 08/06/22 9:19 AM         109      /           72        71                                 08/05/22 9:24 AM         106      /           69        68                                 08/04/22 9:09 AM         113      /           76        71                                 08/03/22 9:36 AM         104      /           75        59                                 08/02/22 9:56 AM         94        /           66        77                                 08/01/22 9:21 AM           116      /           81        94  07/31/22 9:27 AM           116      /           79        78                     07/30/22 9:07 AM           91        /           66        74                     07/29/22 9:18 AM           105      /           73        75                     07/28/22 10:05 AM         117      /           76        69         07/27/22 9:50 AM           100      /           68        63  LABORATORY DATA:    Latest Ref Rng & Units 09/24/2021    9:40 AM 04/16/2021   10:24 AM 03/09/2021    2:58 AM  CBC  WBC 3.4 - 10.8 x10E3/uL 8.6   10.2   Hemoglobin 13.0 - 17.7 g/dL 16.1  09.6  8.7   Hematocrit 37.5 - 51.0 % 41.3  37.8  28.0   Platelets 150 - 450 x10E3/uL 268   359        Latest Ref Rng & Units 09/24/2021    9:37 AM 06/28/2021    9:06 AM 06/19/2021    9:33 AM  CMP  Glucose 70 - 99 mg/dL 95  045  409   BUN 8 - 27 mg/dL 20  24  45   Creatinine 0.76 - 1.27  mg/dL 8.11  9.14  7.82   Sodium 134 - 144 mmol/L 137  137  136   Potassium 3.5 - 5.2 mmol/L 5.0  4.9  5.8   Chloride 96 - 106 mmol/L 104  101  99   CO2 20 - 29 mmol/L 22  22  20    Calcium 8.6 - 10.2 mg/dL 9.8  8.9  95.6   Total Protein 6.0 - 8.5 g/dL 6.2     Total Bilirubin 0.0 - 1.2 mg/dL 0.5     Alkaline Phos 44 - 121 IU/L 59     AST 0 - 40 IU/L 21     ALT 0 - 44 IU/L 19      Recent Labs    09/24/21 0940  PROBNP 344   No results for input(s): "TSH" in the last 8760 hours.   BMP Recent Labs    09/24/21 0937  NA 137  K 5.0  CL 104  CO2 22  GLUCOSE 95  BUN 20  CREATININE 1.43*  CALCIUM 9.8    HEMOGLOBIN A1C Lab Results  Component Value Date   HGBA1C 6.3 (H) 02/27/2021   MPG 134.11 02/27/2021  External Labs:  01/17/2022: Sodium 136, potassium 4.5, BUN 26, creatinine 1.55, glucose 101 Hemoglobin 14.6, hematocrit 45.6, MCV 81.2, platelets 262 Cholesterol 136, triglycerides 166, HDL 42, LDL 71 Hemoglobin Z6X 6.3%  IMPRESSION:    ICD-10-CM   1. Heart failure with improved ejection fraction (HFimpEF) (HCC)  I50.32 Pro b natriuretic peptide (BNP)    Basic metabolic panel    Magnesium    Hemoglobin and hematocrit, blood    2. Chronic heart failure with preserved ejection fraction (HFpEF) (HCC)  I50.32 EKG 12-Lead    sacubitril-valsartan (ENTRESTO) 49-51 MG    Pro b natriuretic peptide (BNP)    Basic metabolic panel    Magnesium    Hemoglobin and hematocrit, blood    3. S/P CABG x 2  Z95.1 Pro b natriuretic peptide (BNP)    Basic metabolic panel    Magnesium    Hemoglobin and hematocrit, blood    4. S/P AVR  Z95.2     5. Paroxysmal atrial fibrillation (HCC)  I48.0     6. Long term (current) use of anticoagulants  Z79.01     7. Benign essential hypertension  I10     8. Former smoker  Z87.891     9. Mixed hyperlipidemia  E78.2       RECOMMENDATIONS: Keith Wagner is a 82 y.o. 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   Heart failure with improved ejection fraction (HFimpEF) (HCC) Chronic heart failure with preserved ejection fraction (HFpEF) (HCC) S/P CABG x 2 Euvolemic. Stage C, NYHA class I/II. December 07, 2020: LVEF 30-35%. December 2022: LVEF 50-55% Currently on appropriate GDMT. Ambulatory blood pressure readings reviewed-many of them are less than 100 mmHg. Given his age we will down titrate his Entresto to 49/51 mg p.o. twice daily to avoid episodes of hypotension/orthostasis. Continue current medical therapy otherwise. Check BMP and NT proBNP.  S/P Bioprosthetic AVR 21 mm Edward InspirEase Resilia Fingerprint echo March 03, 2021. Monitor for now.  Paroxysmal atrial fibrillation (HCC) Long term (current) use of anticoagulants Rate control: Metoprolol. Rhythm control: N/A, history of amiodarone use. Thromboembolic prophylaxis: Eliquis. EKG today shows sinus rhythm. Click Here to Calculate/Change CHADS2VASc Score The patient's CHADS2-VASc score is 5, indicating a 7.2% annual risk of stroke.  Therefore, anticoagulation is recommended.   CHF History: Yes HTN History: Yes Diabetes History: No Stroke History: No Vascular Disease History: Yes Does not endorse evidence of bleeding. Check H&H  Benign essential hypertension Office blood pressures are well-controlled. Home blood pressures overall well-controlled but does have episodes of SBP less than 100 mmHg. Medication changes as discussed above. Continue to monitor  Mixed hyperlipidemia Continue current medical therapy. Most recent lipid profile performed by PCP, will request records.  FINAL MEDICATION LIST  END OF ENCOUNTER: Meds ordered this encounter  Medications   sacubitril-valsartan (ENTRESTO) 49-51 MG    Sig: Take 1 tablet by mouth 2 (two) times daily.    Dispense:  180 tablet    Refill:  0     Medications Discontinued During This Encounter  Medication Reason   triamcinolone cream (KENALOG) 0.1 %    ENTRESTO 97-103 MG Dose change    Current Outpatient Medications:    acetaminophen (TYLENOL) 500  MG tablet, Take 1,000 mg by mouth every 6 (six) hours as needed for moderate pain., Disp: , Rfl:    apixaban (ELIQUIS) 5 MG TABS tablet, Take 1 tablet by mouth twice daily, Disp: 180 tablet, Rfl: 1   aspirin 81 MG tablet, Take 81 mg by mouth daily., Disp: , Rfl:    atorvastatin (LIPITOR) 40 MG tablet, Take 1 tablet (40 mg total) by mouth daily., Disp: 30 tablet, Rfl: 3   calcium carbonate (TUMS - DOSED IN MG ELEMENTAL CALCIUM) 500 MG chewable tablet, Chew 500-1,000 mg by mouth daily as needed for indigestion or heartburn., Disp: , Rfl:    clobetasol cream (TEMOVATE) 0.05 %, Apply 1 application topically 2 (two) times daily as needed (eczema)., Disp: , Rfl:    dapagliflozin propanediol (FARXIGA) 10 MG TABS tablet, Take 1 tablet (10 mg total) by mouth daily before breakfast., Disp: 90 tablet, Rfl: 3   ezetimibe (ZETIA) 10 MG tablet, Take 1 tablet by mouth once daily, Disp: 90 tablet, Rfl: 0   furosemide (LASIX) 20 MG tablet, Take 1 tablet (20 mg total) by mouth daily as needed. Take 1 tablet as needed for weight gain of 1 pound or more in 24 hours or 5 pounds or more in 1 week, Disp: 30 tablet, Rfl: 3   levothyroxine (SYNTHROID) 25 MCG tablet, Take 25 mcg by mouth daily., Disp: , Rfl:    Lutein 6 MG TABS, Take 6 mg by mouth daily., Disp: , Rfl:    MAGNESIUM-OXIDE 400 (240 Mg) MG tablet, Take 1 tablet by mouth once daily, Disp: 90 tablet, Rfl: 0   metoprolol succinate (TOPROL-XL) 25 MG 24 hr tablet, TAKE 1 TABLET BY MOUTH ONCE DAILY. HOLD IF TOP BLOOD PRESSURE NUMBER IS LESS THAN 100 MMHG OR PILSE  LES THAN 60 BPM (Patient taking differently: Take 25 mg by mouth daily. HOLD IF TOP BLOOD PRESSURE NUMBER IS LESS THAN 100 MMHG OR PILSE LES THAN 60 BPM), Disp: 30 tablet, Rfl: 3   Multiple Vitamins-Minerals (MULTIVITAMIN PO), Take 1 tablet by mouth daily., Disp: , Rfl:    Omega-3 Fatty Acids (FISH OIL) 1000 MG CAPS, Take 1,000 mg by mouth daily., Disp: , Rfl:    OVER THE COUNTER MEDICATION, Take 1 tablet by mouth daily. Super Beta Prostate Advanced, Disp: , Rfl:    sacubitril-valsartan (ENTRESTO) 49-51 MG, Take 1 tablet by mouth 2 (two) times daily., Disp: 180 tablet, Rfl: 0   spironolactone (ALDACTONE) 25 MG tablet, Take 1 tablet (25 mg total) by mouth every morning., Disp: 90 tablet, Rfl: 3   feeding supplement (ENSURE ENLIVE / ENSURE PLUS) LIQD, Take 237 mLs by mouth 2 (two) times daily between meals. (Patient not taking: Reported on 08/16/2022), Disp: 237 mL, Rfl: 12  Orders Placed This Encounter  Procedures   Pro b natriuretic peptide (BNP)   Basic metabolic panel   Magnesium   Hemoglobin and hematocrit, blood   EKG 12-Lead    There are no Patient Instructions on file for this visit.   --Continue cardiac medications as reconciled in final medication list. --Return in about 1 year (around 08/16/2023) for Follow up HFpEF, AVR, Pafib. Or sooner if needed. --Continue follow-up with your primary care physician regarding the management of your other chronic comorbid conditions.  Patient's questions and concerns were addressed to his satisfaction. He voices understanding of the instructions provided during this encounter.   This note was created using a voice recognition software as a result there may be grammatical errors inadvertently enclosed  that do not reflect the nature of this encounter. Every attempt is made to correct such errors.  Keith Wagner, [place], [facility]  Pager:  [phone] Office: [phone]

## 2022-08-17 LAB — HEMOGLOBIN AND HEMATOCRIT, BLOOD
Hematocrit: 46 % (ref 37.5–51.0)
Hemoglobin: 15.6 g/dL (ref 13.0–17.7)

## 2022-08-17 LAB — BASIC METABOLIC PANEL
BUN/Creatinine Ratio: 25 — ABNORMAL HIGH (ref 10–24)
BUN: 45 mg/dL — ABNORMAL HIGH (ref 8–27)
CO2: 20 mmol/L (ref 20–29)
Calcium: 9.4 mg/dL (ref 8.6–10.2)
Chloride: 101 mmol/L (ref 96–106)
Creatinine, Ser: 1.8 mg/dL — ABNORMAL HIGH (ref 0.76–1.27)
Glucose: 108 mg/dL — ABNORMAL HIGH (ref 70–99)
Potassium: 5.6 mmol/L — ABNORMAL HIGH (ref 3.5–5.2)
Sodium: 138 mmol/L (ref 134–144)
eGFR: 37 mL/min/{1.73_m2} — ABNORMAL LOW (ref >59)

## 2022-08-17 LAB — PRO B NATRIURETIC PEPTIDE: NT-Pro BNP: 137 pg/mL (ref 0–486)

## 2022-08-17 LAB — MAGNESIUM: Magnesium: 2.5 mg/dL — ABNORMAL HIGH (ref 1.6–2.3)

## 2022-08-23 ENCOUNTER — Emergency Department (HOSPITAL_COMMUNITY)
Admission: EM | Admit: 2022-08-23 | Discharge: 2022-08-23 | Disposition: A | Discharge status: Home / Self Care | Payer: Medicare HMO | Attending: Emergency Medicine | Admitting: Emergency Medicine

## 2022-08-23 ENCOUNTER — Encounter (HOSPITAL_COMMUNITY): Payer: Self-pay

## 2022-08-23 ENCOUNTER — Other Ambulatory Visit: Payer: Self-pay

## 2022-08-23 ENCOUNTER — Telehealth: Payer: Self-pay

## 2022-08-23 DIAGNOSIS — J45909 Unspecified asthma, uncomplicated: Secondary | ICD-10-CM | POA: Diagnosis not present

## 2022-08-23 DIAGNOSIS — Z0189 Encounter for other specified special examinations: Secondary | ICD-10-CM

## 2022-08-23 DIAGNOSIS — N179 Acute kidney failure, unspecified: Secondary | ICD-10-CM | POA: Diagnosis not present

## 2022-08-23 DIAGNOSIS — E119 Type 2 diabetes mellitus without complications: Secondary | ICD-10-CM | POA: Insufficient documentation

## 2022-08-23 DIAGNOSIS — Z87891 Personal history of nicotine dependence: Secondary | ICD-10-CM | POA: Insufficient documentation

## 2022-08-23 DIAGNOSIS — Z7982 Long term (current) use of aspirin: Secondary | ICD-10-CM | POA: Diagnosis not present

## 2022-08-23 DIAGNOSIS — Z955 Presence of coronary angioplasty implant and graft: Secondary | ICD-10-CM | POA: Insufficient documentation

## 2022-08-23 DIAGNOSIS — I5030 Unspecified diastolic (congestive) heart failure: Secondary | ICD-10-CM | POA: Diagnosis not present

## 2022-08-23 DIAGNOSIS — I2510 Atherosclerotic heart disease of native coronary artery without angina pectoris: Secondary | ICD-10-CM | POA: Insufficient documentation

## 2022-08-23 DIAGNOSIS — I130 Hypertensive heart and chronic kidney disease with heart failure and stage 1 through stage 4 chronic kidney disease, or unspecified chronic kidney disease: Secondary | ICD-10-CM | POA: Insufficient documentation

## 2022-08-23 DIAGNOSIS — Z7901 Long term (current) use of anticoagulants: Secondary | ICD-10-CM | POA: Insufficient documentation

## 2022-08-23 DIAGNOSIS — Z79899 Other long term (current) drug therapy: Secondary | ICD-10-CM | POA: Insufficient documentation

## 2022-08-23 DIAGNOSIS — N1832 Chronic kidney disease, stage 3b: Secondary | ICD-10-CM | POA: Diagnosis not present

## 2022-08-23 DIAGNOSIS — Z7984 Long term (current) use of oral hypoglycemic drugs: Secondary | ICD-10-CM | POA: Insufficient documentation

## 2022-08-23 DIAGNOSIS — R799 Abnormal finding of blood chemistry, unspecified: Secondary | ICD-10-CM | POA: Diagnosis present

## 2022-08-23 LAB — COMPREHENSIVE METABOLIC PANEL
ALT: 22 U/L (ref 0–44)
AST: 25 U/L (ref 15–41)
Albumin: 3.7 g/dL (ref 3.5–5.0)
Alkaline Phosphatase: 55 U/L (ref 38–126)
Anion gap: 8 (ref 5–15)
BUN: 38 mg/dL — ABNORMAL HIGH (ref 8–23)
CO2: 21 mmol/L — ABNORMAL LOW (ref 22–32)
Calcium: 8.4 mg/dL — ABNORMAL LOW (ref 8.9–10.3)
Chloride: 101 mmol/L (ref 98–111)
Creatinine, Ser: 1.6 mg/dL — ABNORMAL HIGH (ref 0.61–1.24)
GFR, Estimated: 43 mL/min — ABNORMAL LOW (ref >60)
Glucose, Bld: 110 mg/dL — ABNORMAL HIGH (ref 70–99)
Potassium: 4.5 mmol/L (ref 3.5–5.1)
Sodium: 130 mmol/L — ABNORMAL LOW (ref 135–145)
Total Bilirubin: 1 mg/dL (ref 0.3–1.2)
Total Protein: 6.8 g/dL (ref 6.5–8.1)

## 2022-08-23 LAB — CBC
HCT: 47.5 % (ref 39.0–52.0)
Hemoglobin: 15.2 g/dL (ref 13.0–17.0)
MCH: 29.1 pg (ref 26.0–34.0)
MCHC: 32 g/dL (ref 30.0–36.0)
MCV: 90.8 fL (ref 80.0–100.0)
Platelets: 223 10*3/uL (ref 150–400)
RBC: 5.23 MIL/uL (ref 4.22–5.81)
RDW: 15.3 % (ref 11.5–15.5)
WBC: 9.5 10*3/uL (ref 4.0–10.5)
nRBC: 0 % (ref 0.0–0.2)

## 2022-08-23 NOTE — ED Triage Notes (Signed)
Pt arrived POV w/ c/o cardiologist RN told him to go to the ER for 2 abnormal labs for more labwork. Checked chart and the labs that are abnormal are Creatinine, BUN and K is 5.6. Pt and wife were not told which labs specifically. Pt A&Ox4 and in NAD at this time.

## 2022-08-23 NOTE — Telephone Encounter (Signed)
Spoke with wife concerning lab results. Advised her that Dr. Odis Hollingshead wants Jatavion to go to the ED and have labs redrawn today and schedule 1 week follow up. She verbalized understanding. Call transferred to front office to schedule follow up appt.

## 2022-08-23 NOTE — ED Notes (Signed)
Patient verbalizes understanding of discharge instructions. Opportunity for questioning and answers were provided. Pt discharged from ED. 

## 2022-08-23 NOTE — Discharge Instructions (Addendum)
We evaluated you for your abnormal labs.   Your kidney function has improved since your test last week and your potassium is normal. Your kidney function is still slightly elevated but improving.   Your cardiologist Dr. Odis Hollingshead wants you to stop your Aldactone (spironolactone) and Entresto for now.   Please return to the ER if you develop any new symptoms such as fevers or chills, nausea or vomiting, chest pain, lightheadedness, leg swelling, difficulty breathing, confusion or any other new or concerning symptoms.

## 2022-08-25 NOTE — ED Provider Notes (Signed)
[facility] AT [facility] Provider Note  CSN: [id number] Arrival date & time: 08/23/22 1141  Chief Complaint(s) Abnormal Labs (Elevated BUN, Creatinine, K)  HPI Keith Wagner is a 82 y.o. male  with PMH CHF, CAD, DM presenting to the ER for lab check. He was seen by his cardiologist one week ago and had labs drawn. He was called today to come to the ER for lab check after his cardiologist saw the results with mild hyperkalemia and AKI. He has already been told to stop his entresto and spironolactone for now. He feels fine and has no medical complaints including no chest pain, shortness of breath, leg swelling, polyuria or polydipsia, fatigue, weakness, syncope, lightheadedness, nausea, vomiting, diarrhea, or any other new symptoms.    Past Medical History Past Medical History:  Diagnosis Date   Arthritis    Asthma    uses inhaler PRN   Bilateral carotid bruits 06/12/2018   Cataract    Diabetes mellitus without complication (HCC)    per pt "pre-diabetic"- off Metformin- diet controlled    GERD (gastroesophageal reflux disease)    Gout    Heart murmur    Hypercholesteremia 06/12/2018   Hyperlipidemia    Hypertension    Mild aortic stenosis 06/12/2018   Patient Active Problem List   Diagnosis Date Noted   Long term (current) use of anticoagulants    Long term current use of antiarrhythmic drug    AKI (acute kidney injury) (HCC)    Anemia associated with acute blood loss    CAD, multiple vessel    Pressure injury of skin 03/09/2021   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   Acute diastolic heart failure (HCC)    PAF (paroxysmal atrial fibrillation) (HCC)    Chronic renal failure, stage 3b (HCC)    Sinus node dysfunction (HCC)    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 Authorizing Provider  acetaminophen (TYLENOL) 500 MG tablet Take 1,000 mg by mouth every 6 (six) hours as needed for moderate pain.    [provider]  apixaban (ELIQUIS) 5 MG TABS tablet Take 1 tablet by mouth twice daily 03/11/22   Tolia, Sunit, DO  aspirin 81 MG tablet Take 81 mg by mouth daily.    [provider]  atorvastatin (LIPITOR) 40 MG tablet Take 1 tablet (40 mg total) by mouth daily. 03/12/21   Gold, Glenice Laine, PA-C  calcium carbonate (TUMS - DOSED IN MG ELEMENTAL CALCIUM) 500 MG chewable tablet Chew 500-1,000 mg by mouth daily as needed for indigestion or heartburn.    [provider]  clobetasol cream (TEMOVATE) 0.05 % Apply 1 application topically 2 (two) times daily as needed (eczema). 01/10/21   [provider]  dapagliflozin propanediol (FARXIGA) 10 MG TABS tablet Take 1 tablet (10 mg total) by mouth daily before breakfast. 02/18/22   Nori Riis, NP  ezetimibe (ZETIA) 10 MG tablet Take 1 tablet by mouth once daily 08/05/22   Tolia, Sunit, DO  feeding supplement (ENSURE ENLIVE / ENSURE PLUS) LIQD Take 237  mLs by mouth 2 (two) times daily between meals. Patient not taking: Reported on 08/16/2022 03/12/21   Gershon Crane E, PA-C  furosemide (LASIX) 20 MG tablet Take 1 tablet (20 mg total) by mouth daily as needed. Take 1 tablet as needed for weight gain of 1 pound or more in 24 hours or 5 pounds or more in 1 week 02/18/22 08/16/22  Nori Riis, NP  levothyroxine (SYNTHROID) 25 MCG tablet Take 25 mcg by mouth daily.    [provider]  Lutein 6 MG TABS Take 6 mg by mouth daily.    [provider]   MAGNESIUM-OXIDE 400 (240 Mg) MG tablet Take 1 tablet by mouth once daily 07/15/22   Tolia, Sunit, DO  metoprolol succinate (TOPROL-XL) 25 MG 24 hr tablet TAKE 1 TABLET BY MOUTH ONCE DAILY. HOLD IF TOP BLOOD PRESSURE NUMBER IS LESS THAN 100 MMHG OR PILSE LES THAN 60 BPM Patient taking differently: Take 25 mg by mouth daily. HOLD IF TOP BLOOD PRESSURE NUMBER IS LESS THAN 100 MMHG OR PILSE LES THAN 60 BPM 08/03/20   Tolia, Sunit, DO  Multiple Vitamins-Minerals (MULTIVITAMIN PO) Take 1 tablet by mouth daily.    [provider]  Omega-3 Fatty Acids (FISH OIL) 1000 MG CAPS Take 1,000 mg by mouth daily.    [provider]  OVER THE COUNTER MEDICATION Take 1 tablet by mouth daily. Super Beta Prostate Advanced    [provider]  sacubitril-valsartan (ENTRESTO) 49-51 MG Take 1 tablet by mouth 2 (two) times daily. 08/16/22 11/14/22  Tessa Lerner, DO  spironolactone (ALDACTONE) 25 MG tablet Take 1 tablet (25 mg total) by mouth every morning. 08/15/22 02/11/23  Yates Decamp, MD                                                                                                                                    Past Surgical History Past Surgical History:  Procedure Laterality Date   AORTIC VALVE REPLACEMENT N/A 03/01/2021   Procedure: AORTIC VALVE REPLACEMENT (AVR) WITH INSPIRIS RESILIA  AORTIC VALVE SIZE ;  Surgeon: Loreli Slot, MD;  Location: [facility] OR;  Service: Open Heart Surgery;  Laterality: N/A;   CARDIAC CATHETERIZATION     CATARACT EXTRACTION Left    CHEST EXPLORATION  03/01/2021   Procedure: CHEST EXPLORATION;  Surgeon: Loreli Slot, MD;  Location: [facility] OR;  Service: Open Heart Surgery;;   COLONOSCOPY     CORONARY ARTERY BYPASS GRAFT N/A 03/01/2021   Procedure: CORONARY ARTERY BYPASS GRAFTING (CABG) X 2 USING LEFT INTERNAL MAMMARY ARTERY AND RIGHT ENDOSCOPIC GREATER SAPHENOUS VEIN CONDUITS;  Surgeon: Loreli Slot, MD;  Location: [facility] OR;  Service: Open Heart  Surgery;  Laterality: N/A;   ENDOVEIN HARVEST OF GREATER SAPHENOUS VEIN Right 03/01/2021   Procedure: ENDOVEIN HARVEST OF GREATER SAPHENOUS VEIN;  Surgeon: Loreli Slot, MD;  Location: [facility] OR;  Service: Open Heart Surgery;  Laterality:  Right;   EYE SURGERY     POLYPECTOMY     RIGHT HEART CATH AND CORONARY ANGIOGRAPHY N/A 02/06/2021   Procedure: RIGHT HEART CATH AND CORONARY ANGIOGRAPHY;  Surgeon: Elder Negus, MD;  Location: [facility] INVASIVE CV LAB;  Service: Cardiovascular;  Laterality: N/A;   TEE WITHOUT CARDIOVERSION N/A 03/01/2021   Procedure: TRANSESOPHAGEAL ECHOCARDIOGRAM (TEE);  Surgeon: Loreli Slot, MD;  Location: [facility] OR;  Service: Open Heart Surgery;  Laterality: N/A;   TEMPORARY PACEMAKER N/A 03/03/2021   Procedure: TEMPORARY PACEMAKER;  Surgeon: Yates Decamp, MD;  Location: [facility] INVASIVE CV LAB;  Service: Cardiovascular;  Laterality: N/A;   THORACIC AORTOGRAM N/A 02/06/2021   Procedure: THORACIC AORTOGRAM;  Surgeon: Elder Negus, MD;  Location: [facility] INVASIVE CV LAB;  Service: Cardiovascular;  Laterality: N/A;   Family History Family History  Problem Relation Age of Onset   Colon cancer Neg Hx    Esophageal cancer Neg Hx    Stomach cancer Neg Hx    Rectal cancer Neg Hx    Colon polyps Neg Hx     Social History Social History   Tobacco Use   Smoking status: Former    Types: Cigars    Quit date: 1960    Years since quitting: 64.4   Smokeless tobacco: Never  Vaping Use   Vaping Use: Never used  Substance Use Topics   Alcohol use: No   Drug use: No   Allergies Patient has no known allergies.  Review of Systems Review of Systems  All other systems reviewed and are negative.   Physical Exam Vital Signs  I have reviewed the triage vital signs BP 134/76   Pulse 66   Temp 97.7 F (36.5 C) (Oral)   Resp 15   Ht 5\' 6"  (1.676 m)   Wt 76.7 kg   SpO2 100%   BMI 27.28 kg/m  Physical Exam Vitals and nursing note reviewed.  Constitutional:       General: He is not in acute distress.    Appearance: Normal appearance.  HENT:     Mouth/Throat:     Mouth: Mucous membranes are moist.  Eyes:     Conjunctiva/sclera: Conjunctivae normal.  Cardiovascular:     Rate and Rhythm: Normal rate and regular rhythm.  Pulmonary:     Effort: Pulmonary effort is normal. No respiratory distress.     Breath sounds: Normal breath sounds.  Abdominal:     General: Abdomen is flat.     Palpations: Abdomen is soft.     Tenderness: There is no abdominal tenderness.  Musculoskeletal:     Right lower leg: No edema.     Left lower leg: No edema.  Skin:    General: Skin is warm and dry.     Capillary Refill: Capillary refill takes less than 2 seconds.  Neurological:     Mental Status: He is alert and oriented to person, place, and time. Mental status is at baseline.  Psychiatric:        Mood and Affect: Mood normal.        Behavior: Behavior normal.     ED Results and Treatments Labs (all labs ordered are listed, but only abnormal results are displayed) Labs Reviewed  COMPREHENSIVE METABOLIC PANEL - Abnormal; Notable for the following components:      Result Value   Sodium 130 (*)    CO2 21 (*)    Glucose, Bld 110 (*)    BUN 38 (*)    Creatinine,  Ser 1.60 (*)    Calcium 8.4 (*)    GFR, Estimated 43 (*)    All other components within normal limits  CBC                                                                                                                          Radiology No results found.  Pertinent labs & imaging results that were available during my care of the patient were reviewed by me and considered in my medical decision making (see MDM for details).  Medications Ordered in ED Medications - No data to display                                                                                                                                   Procedures Procedures  (including critical care time)  Medical Decision  Making / ED Course   MDM:  82 y/o presenting for lab check for abnormal labs 1 week ago.   His labs were repeated and his AKI has improved. He has no hyperkalemia. He is going to follow up with his cardiologist as he is discontinuing his entresto and spironolactone for now. He feels great and has no further medical complaints or symptoms he wants addressed. Since creatinine is improving and not far off his baseline of around ~1.4 I think he is safe for discharge. Will discharge patient to home. All questions answered. Patient comfortable with plan of discharge. Return precautions discussed with patient and specified on the after visit summary.       Additional history obtained: -Additional history obtained from family -External records from outside source obtained and reviewed including: Chart review including previous notes, labs, imaging, consultation notes including cardiology notes   Lab Tests: -I ordered, reviewed, and interpreted labs.   The pertinent results include:   Labs Reviewed  COMPREHENSIVE METABOLIC PANEL - Abnormal; Notable for the following components:      Result Value   Sodium 130 (*)    CO2 21 (*)    Glucose, Bld 110 (*)    BUN 38 (*)    Creatinine, Ser 1.60 (*)    Calcium 8.4 (*)    GFR, Estimated 43 (*)    All other components within normal limits  CBC    Notable for improving creatinine, no hyperkalemia  EKG   EKG Interpretation  Date/Time:  Friday Aug 23 2022 12:01:16 EDT  Ventricular Rate:  68 PR Interval:  206 QRS Duration: 98 QT Interval:  416 QTC Calculation: 442 R Axis:   -33 Text Interpretation: Normal sinus rhythm Left axis deviation Minimal voltage criteria for LVH, may be normal variant ( R in aVL ) Septal infarct , age undetermined Abnormal ECG When compared with ECG of 03-Mar-2021 17:09, now in sinus rhythm Confirmed by Alvino Blood ([id number]) on 08/23/2022 12:55:53 PM         Medicines ordered and prescription drug  management: No orders of the defined types were placed in this encounter.   -I have reviewed the patients home medicines and have made adjustments as needed  Social Determinants of Health:  Diagnosis or treatment significantly limited by social determinants of health: former smoker   Co morbidities that complicate the patient evaluation  Past Medical History:  Diagnosis Date   Arthritis    Asthma    uses inhaler PRN   Bilateral carotid bruits 06/12/2018   Cataract    Diabetes mellitus without complication (HCC)    per pt "pre-diabetic"- off Metformin- diet controlled    GERD (gastroesophageal reflux disease)    Gout    Heart murmur    Hypercholesteremia 06/12/2018   Hyperlipidemia    Hypertension    Mild aortic stenosis 06/12/2018      Dispostion: Disposition decision including need for hospitalization was considered, and patient discharged from emergency department.    Final Clinical Impression(s) / ED Diagnoses Final diagnoses:  AKI (acute kidney injury) (HCC)  Encounter for laboratory examination     This chart was dictated using voice recognition software.  Despite best efforts to proofread,  errors can occur which can change the documentation meaning.    Lonell Grandchild, MD 08/25/22 [phone]

## 2022-08-27 ENCOUNTER — Other Ambulatory Visit: Payer: Self-pay

## 2022-08-27 ENCOUNTER — Encounter: Payer: Self-pay | Admitting: Cardiology

## 2022-08-27 ENCOUNTER — Ambulatory Visit: Payer: Medicare HMO | Admitting: Cardiology

## 2022-08-27 VITALS — BP 166/100 | HR 72 | Ht 66.0 in | Wt 177.2 lb

## 2022-08-27 DIAGNOSIS — Z7901 Long term (current) use of anticoagulants: Secondary | ICD-10-CM

## 2022-08-27 DIAGNOSIS — Z952 Presence of prosthetic heart valve: Secondary | ICD-10-CM

## 2022-08-27 DIAGNOSIS — Z951 Presence of aortocoronary bypass graft: Secondary | ICD-10-CM

## 2022-08-27 DIAGNOSIS — Z87891 Personal history of nicotine dependence: Secondary | ICD-10-CM

## 2022-08-27 DIAGNOSIS — I10 Essential (primary) hypertension: Secondary | ICD-10-CM

## 2022-08-27 DIAGNOSIS — I5032 Chronic diastolic (congestive) heart failure: Secondary | ICD-10-CM

## 2022-08-27 DIAGNOSIS — I480 Paroxysmal atrial fibrillation: Secondary | ICD-10-CM

## 2022-08-27 MED ORDER — RIVAROXABAN 15 MG PO TABS
15.0000 mg | ORAL_TABLET | Freq: Every day | ORAL | 0 refills | Status: DC
Start: 2022-08-27 — End: 2022-09-16

## 2022-08-27 MED ORDER — SPIRONOLACTONE 25 MG PO TABS
12.5000 mg | ORAL_TABLET | Freq: Every morning | ORAL | 3 refills | Status: DC
Start: 2022-08-27 — End: 2023-05-29

## 2022-08-27 MED ORDER — ENTRESTO 49-51 MG PO TABS
1.0000 | ORAL_TABLET | Freq: Two times a day (BID) | ORAL | 0 refills | Status: AC
Start: 2022-08-27 — End: 2022-11-25

## 2022-08-27 NOTE — Progress Notes (Signed)
ID:  BRENNDAN Wagner, DOB Mar 09, 1941, MRN [id number]  PCP:  Richmond Campbell., PA-C  Cardiologist:  Tessa Lerner, DO, [facility] (established care 06/10/2019) Cardiothoracic surgeon: Dr. Dorris Fetch Former Cardiology Providers: [place], APRN, FNP-C  Date: 08/27/22 Last Office Visit: 08/16/2022  Chief Complaint  Patient presents with   HFimpEF   Chronic diastolic congestive heart failure, NYHA class 2    HPI  Keith Wagner is a 82 y.o. 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   In September 2022 and underwent an echocardiogram which noted reduced LVEF along with grade 2 diastolic impairment and elevated left atrial pressure.  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  On March 01, 2021 he underwent two-vessel CABG (LIMA to the LAD and SVG to OM) and a bioprosthetic aortic valve with 21 mm Edwards Inspiris Resilia valve.  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  During his 1 year follow-up visit in May 2024 he had follow-up labs to reevaluate kidney function and electrolytes.  Labs noted renal insufficiency as well as hyperkalemia.  He had gone to ED for reevaluation.  He did not have hyperkalemia at that time and he was advised to stop Entresto as well as spironolactone.  Patient was also accompanied by his wife Keith Wagner who provides collateral history as part  of today's encounter.  ALLERGIES: No Known Allergies  MEDICATION LIST PRIOR TO VISIT: Current Meds  Medication Sig   acetaminophen (TYLENOL) 500 MG tablet Take 1,000 mg by mouth every 6 (six) hours as needed for moderate pain.   aspirin 81 MG tablet Take 81 mg by mouth daily.   atorvastatin (LIPITOR) 40 MG tablet Take 1 tablet (40 mg total) by mouth daily.   calcium carbonate (TUMS - DOSED IN MG ELEMENTAL CALCIUM) 500 MG chewable tablet Chew 500-1,000 mg by mouth daily as needed for indigestion or heartburn.   dapagliflozin propanediol (FARXIGA) 10 MG TABS tablet Take 1 tablet (10 mg total) by mouth daily before breakfast.   ezetimibe (ZETIA) 10 MG tablet Take 1 tablet by mouth once daily   feeding supplement (ENSURE ENLIVE / ENSURE PLUS) LIQD Take 237 mLs by mouth 2 (two) times daily between meals.   furosemide (LASIX) 20 MG tablet Take 1 tablet (20 mg total) by mouth daily as needed. Take 1 tablet as needed for weight gain of 1 pound or more in 24 hours or 5 pounds or more in 1 week   levothyroxine (SYNTHROID) 25 MCG tablet Take 25 mcg by mouth daily.   Lutein 6 MG TABS Take 6 mg by mouth daily.   MAGNESIUM-OXIDE 400 (240 Mg) MG tablet Take 1 tablet by mouth once daily   metoprolol succinate (TOPROL-XL) 25 MG 24 hr tablet TAKE 1 TABLET BY MOUTH ONCE DAILY. HOLD IF TOP BLOOD PRESSURE NUMBER IS LESS THAN 100 MMHG OR PILSE LES THAN 60 BPM (Patient taking differently: Take 25 mg by mouth daily. HOLD IF TOP BLOOD PRESSURE NUMBER IS LESS  THAN 100 MMHG OR PILSE LES THAN 60 BPM)   Multiple Vitamins-Minerals (MULTIVITAMIN PO) Take 1 tablet by mouth daily.   Omega-3 Fatty Acids (FISH OIL) 1000 MG CAPS Take 1,000 mg by mouth daily.   OVER THE COUNTER MEDICATION Take 1 tablet by mouth daily. Super Beta Prostate Advanced   Rivaroxaban (XARELTO) 15 MG TABS tablet Take 1 tablet (15 mg total) by mouth daily with supper.   [DISCONTINUED] apixaban (ELIQUIS) 5 MG TABS tablet Take 1 tablet by mouth twice daily      PAST MEDICAL HISTORY: Past Medical History:  Diagnosis Date   Arthritis    Asthma    uses inhaler PRN   Bilateral carotid bruits 06/12/2018   Cataract    Diabetes mellitus without complication (HCC)    per pt "pre-diabetic"- off Metformin- diet controlled    GERD (gastroesophageal reflux disease)    Gout    Heart murmur    Hypercholesteremia 06/12/2018   Hyperlipidemia    Hypertension    Mild aortic stenosis 06/12/2018    PAST SURGICAL HISTORY: Past Surgical History:  Procedure Laterality Date   AORTIC VALVE REPLACEMENT N/A 03/01/2021   Procedure: AORTIC VALVE REPLACEMENT (AVR) WITH INSPIRIS RESILIA  AORTIC VALVE SIZE ;  Surgeon: Loreli Slot, MD;  Location: [facility] OR;  Service: Open Heart Surgery;  Laterality: N/A;   CARDIAC CATHETERIZATION     CATARACT EXTRACTION Left    CHEST EXPLORATION  03/01/2021   Procedure: CHEST EXPLORATION;  Surgeon: Loreli Slot, MD;  Location: [facility] OR;  Service: Open Heart Surgery;;   COLONOSCOPY     CORONARY ARTERY BYPASS GRAFT N/A 03/01/2021   Procedure: CORONARY ARTERY BYPASS GRAFTING (CABG) X 2 USING LEFT INTERNAL MAMMARY ARTERY AND RIGHT ENDOSCOPIC GREATER SAPHENOUS VEIN CONDUITS;  Surgeon: Loreli Slot, MD;  Location: [facility] OR;  Service: Open Heart Surgery;  Laterality: N/A;   ENDOVEIN HARVEST OF GREATER SAPHENOUS VEIN Right 03/01/2021   Procedure: ENDOVEIN HARVEST OF GREATER SAPHENOUS VEIN;  Surgeon: Loreli Slot, MD;  Location: [facility] OR;  Service: Open Heart Surgery;  Laterality: Right;   EYE SURGERY     POLYPECTOMY     RIGHT HEART CATH AND CORONARY ANGIOGRAPHY N/A 02/06/2021   Procedure: RIGHT HEART CATH AND CORONARY ANGIOGRAPHY;  Surgeon: Elder Negus, MD;  Location: [facility] INVASIVE CV LAB;  Service: Cardiovascular;  Laterality: N/A;   TEE WITHOUT CARDIOVERSION N/A 03/01/2021   Procedure: TRANSESOPHAGEAL ECHOCARDIOGRAM (TEE);  Surgeon: Loreli Slot, MD;  Location: [facility] OR;  Service: Open Heart Surgery;   Laterality: N/A;   TEMPORARY PACEMAKER N/A 03/03/2021   Procedure: TEMPORARY PACEMAKER;  Surgeon: Yates Decamp, MD;  Location: [facility] INVASIVE CV LAB;  Service: Cardiovascular;  Laterality: N/A;   THORACIC AORTOGRAM N/A 02/06/2021   Procedure: THORACIC AORTOGRAM;  Surgeon: Elder Negus, MD;  Location: [facility] INVASIVE CV LAB;  Service: Cardiovascular;  Laterality: N/A;    FAMILY HISTORY: No family history of premature coronary disease or sudden cardiac death.  SOCIAL HISTORY:  The patient  reports that he quit smoking about 64 years ago. His smoking use included cigars. He has never used smokeless tobacco. He reports that he does not drink alcohol and does not use drugs.  REVIEW OF SYSTEMS: Review of Systems  Cardiovascular:  Negative for chest pain, claudication, dyspnea on exertion, irregular heartbeat, leg swelling, near-syncope, orthopnea, palpitations, paroxysmal nocturnal dyspnea and syncope.  Respiratory:  Negative for shortness of breath.   Hematologic/Lymphatic: Negative for bleeding problem.  Musculoskeletal:  Negative  for muscle cramps and myalgias.  Neurological:  Negative for dizziness and light-headedness.    PHYSICAL EXAM:    08/27/2022    9:08 AM 08/23/2022    2:15 PM 08/23/2022    1:15 PM  Vitals with BMI  Height 5\' 6"     Weight 177 lbs 3 oz    BMI 28.61    Systolic 166 134 161  Diastolic 100 76 84  Pulse 72 66 66   Physical Exam Neck:     Vascular: No carotid bruit or JVD.  Cardiovascular:     Rate and Rhythm: Normal rate and regular rhythm.     Pulses: Normal pulses.          Carotid pulses are 2+ on the right side and 2+ on the left side.      Radial pulses are 2+ on the right side and 2+ on the left side.       Dorsalis pedis pulses are 2+ on the right side and 2+ on the left side.     Heart sounds: Normal heart sounds. No murmur heard.    No gallop.  Pulmonary:     Effort: Pulmonary effort is normal.     Breath sounds: Normal breath sounds. No wheezing  or rales.  Abdominal:     General: There is no distension.     Palpations: Abdomen is soft.     Tenderness: There is no abdominal tenderness.  Musculoskeletal:     Right lower leg: No edema.     Left lower leg: No edema.  Neurological:     Mental Status: He is alert.     CARDIAC DATABASE: CABG x2 on 03/01/2021: LIMA to LAD and SVG to OM and aortic valve replacement with a 21 mm Edwards InspirEase Resilia bioprosthetic valve.   Temporary Pacer Placement 03/03/2021.  EKG: Aug 16, 2022: Sinus bradycardia, 57 bpm, low voltage in precordial leads, left axis, left anterior fascicular block, without underlying injury pattern.  Echocardiogram: 05/25/2019: LVEF 50-55%, grade 1 diastolic impairment  12/07/2020: LVEF 30-35%, moderate LVH, global HK, grade 2 DD, elevated LAP, mild LAE, moderate AAS, moderate AR, moderate MR, mild TR, mild PHTN.  03/03/2021:  1. Left ventricular ejection fraction, by estimation, is 50 to 55%. Left ventricular ejection fraction by PLAX is 45 %. The left ventricle has low normal function. The left ventricle has no regional wall motion normalities. There is mild left ventricular hypertrophy. Left ventricular diastolic parameters are consistent with Grade II diastolic dysfunction (pseudonormalization). Elevated left ventricular end-diastolic pressure.   2. Right ventricular systolic function is normal. The right ventricular size is normal.   3. Left atrial size was moderately dilated.   4. The mitral valve was not well visualized. Trivial mitral valve regurgitation.   5. The aortic valve was not well visualized. Aortic valve regurgitation is not visualized. Mild aortic valve stenosis. There is a 21 mm Edwards InspirEase Resilia valve present in the aortic position. Aortic valve area, by VTI measures 0.98 cm.  Aortic valve mean gradient measures 17.5 mmHg. Aortic valve Vmax measures 2.77 m/s.   6. The inferior vena cava is dilated in size with <50% respiratory  variability, suggesting right atrial pressure of 15 mmHg.    Stress Testing: Treadmill Exercise stress 11/11/2014: Indication: Diabetes, HTN The patient exercised according to Bruce Protocol, Total time recorded  6:00 min achieving max heart rate of  148 which was  100% of MPHR for age and  7.05 METS of work. Occasional PVC during  exercise.  Normal BP response. Resting ECG showing NSR. There was no ST-T changes of ischemia with exercise stress test. No exercise induced arrythmias noted. Stress terminated due to [facility] met.  Heart Catheterization: LHC / RHC / Thoracic Aortogram 02/06/2021: LM: Distal calcific 75% stenosis LAD: Mid 80% stenosis LCX: large vessel. Ostial 70% stenosis.  RCA: Dominant, but small vessel. No significant disease seen on well opacified non-selective angiogram.   Normal PCW Unable to cross aortic valve due to severe aorta tortuosity.   CVTS consult for CABG +/- AVR  Carotid artery duplex 12/04/2017: Minimal stenosis in the right internal carotid artery (1-15%). Minimal stenosis in the left internal carotid artery (1-15%). Antegrade right vertebral artery flow. Antegrade left vertebral artery flow. Follow up when clinically indicated.  LABORATORY DATA:    Latest Ref Rng & Units 08/23/2022   12:29 PM 08/16/2022   11:24 AM 09/24/2021    9:40 AM  CBC  WBC 4.0 - 10.5 K/uL 9.5   8.6   Hemoglobin 13.0 - 17.0 g/dL 16.1  09.6  04.5   Hematocrit 39.0 - 52.0 % 47.5  46.0  41.3   Platelets 150 - 400 K/uL 223   268        Latest Ref Rng & Units 08/23/2022   12:29 PM 08/16/2022   11:24 AM 09/24/2021    9:37 AM  CMP  Glucose 70 - 99 mg/dL 409  811  95   BUN 8 - 23 mg/dL 38  45  20   Creatinine 0.61 - 1.24 mg/dL 9.14  7.82  9.56   Sodium 135 - 145 mmol/L 130  138  137   Potassium 3.5 - 5.1 mmol/L 4.5  5.6  5.0   Chloride 98 - 111 mmol/L 101  101  104   CO2 22 - 32 mmol/L 21  20  22    Calcium 8.9 - 10.3 mg/dL 8.4  9.4  9.8   Total Protein 6.5 - 8.1 g/dL 6.8   6.2    Total Bilirubin 0.3 - 1.2 mg/dL 1.0   0.5   Alkaline Phos 38 - 126 U/L 55   59   AST 15 - 41 U/L 25   21   ALT 0 - 44 U/L 22   19    Recent Labs    09/24/21 0940 08/16/22 1124  PROBNP 344 137   No results for input(s): "TSH" in the last 8760 hours.   BMP Recent Labs    09/24/21 0937 08/16/22 1124 08/23/22 1229  NA 137 138 130*  K 5.0 5.6* 4.5  CL 104 101 101  CO2 22 20 21*  GLUCOSE 95 108* 110*  BUN 20 45* 38*  CREATININE 1.43* 1.80* 1.60*  CALCIUM 9.8 9.4 8.4*  GFRNONAA  --   --  43*    HEMOGLOBIN A1C Lab Results  Component Value Date   HGBA1C 6.3 (H) 02/27/2021   MPG 134.11 02/27/2021    External Labs:  01/17/2022: Sodium 136, potassium 4.5, BUN 26, creatinine 1.55, glucose 101 Hemoglobin 14.6, hematocrit 45.6, MCV 81.2, platelets 262 Cholesterol 136, triglycerides 166, HDL 42, LDL 71 Hemoglobin O1H 6.3%  IMPRESSION:    ICD-10-CM   1. Chronic heart failure with preserved ejection fraction (HFpEF) (HCC)  I50.32 sacubitril-valsartan (ENTRESTO) 49-51 MG    spironolactone (ALDACTONE) 25 MG tablet    Basic metabolic panel    Pro b natriuretic peptide (BNP)    2. S/P CABG x 2  Z95.1     3. S/P  AVR  Z95.2     4. Paroxysmal atrial fibrillation (HCC)  I48.0     5. Long term (current) use of anticoagulants  Z79.01 Rivaroxaban (XARELTO) 15 MG TABS tablet    6. Benign essential hypertension  I10     7. Former smoker  Z87.891       RECOMMENDATIONS: KYLA TIMM is a 82 y.o. 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  Heart failure with improved ejection fraction (HFimpEF) (HCC) Chronic heart failure with  preserved ejection fraction (HFpEF) (HCC) S/P CABG x 2 Euvolemic. Stage C, NYHA class I/II. December 07, 2020: LVEF 30-35%. December 2022: LVEF 50-55% Currently on appropriate GDMT. In the past ambulatory blood pressure readings reviewed-many of them are less than 100 mmHg. His labs from Aug 16, 2022 noted hyperkalemia as well as acute renal insufficiency.  These labs were drawn while he was taking Entresto 97/103 mg p.o. twice daily. Will decrease Entresto to 49/51 mg p.o. twice daily. Will restart spironolactone at 12.5 mg p.o. daily. Recheck labs in 1 week.  S/P Bioprosthetic AVR 21 mm Edward InspirEase Resilia Fingerprint echo March 03, 2021. Monitor for now.  Paroxysmal atrial fibrillation (HCC) Long term (current) use of anticoagulants Rate control: Metoprolol. Rhythm control: N/A, history of amiodarone use. Thromboembolic prophylaxis: Xarelto. Given his renal function and age we will transition him to Xarelto which is once a day based on his renal clearance.  Prior EKG notes sinus rhythm.   Click Here to Calculate/Change CHADS2VASc Score The patient's CHADS2-VASc score is 5, indicating a 7.2% annual risk of stroke.  Therefore, anticoagulation is recommended.   CHF History: Yes HTN History: Yes Diabetes History: No Stroke History: No Vascular Disease History: Yes Does not endorse evidence of bleeding. Have given samples for Xarelto for now until he gets a prescription for Xarelto mailed by [facility].  Benign essential hypertension Office blood pressures are not well-controlled.  Given his renal function he was not taking Entresto or spironolactone as advised by ED provider I have asked him to keep a log of his blood pressures at home. Medication changes as discussed above   Mixed hyperlipidemia Continue current medical therapy. Most recent lipid profile performed by PCP, will request records.  Patient assistance watch for Eliquis, Sherryll Burger, and Marcelline Deist provided to  the patient and wife.  FINAL MEDICATION LIST END OF ENCOUNTER: Meds ordered this encounter  Medications   sacubitril-valsartan (ENTRESTO) 49-51 MG    Sig: Take 1 tablet by mouth 2 (two) times daily.    Dispense:  180 tablet    Refill:  0   spironolactone (ALDACTONE) 25 MG tablet    Sig: Take 0.5 tablets (12.5 mg total) by mouth every morning.    Dispense:  90 tablet    Refill:  3   Rivaroxaban (XARELTO) 15 MG TABS tablet    Sig: Take 1 tablet (15 mg total) by mouth daily with supper.    Dispense:  90 tablet    Refill:  0     Medications Discontinued During This Encounter  Medication Reason   apixaban (ELIQUIS) 5 MG TABS tablet Change in therapy   spironolactone (ALDACTONE) 25 MG tablet Reorder   sacubitril-valsartan (ENTRESTO) 49-51 MG Reorder  Current Outpatient Medications:    acetaminophen (TYLENOL) 500 MG tablet, Take 1,000 mg by mouth every 6 (six) hours as needed for moderate pain., Disp: , Rfl:    aspirin 81 MG tablet, Take 81 mg by mouth daily., Disp: , Rfl:    atorvastatin (LIPITOR) 40 MG tablet, Take 1 tablet (40 mg total) by mouth daily., Disp: 30 tablet, Rfl: 3   calcium carbonate (TUMS - DOSED IN MG ELEMENTAL CALCIUM) 500 MG chewable tablet, Chew 500-1,000 mg by mouth daily as needed for indigestion or heartburn., Disp: , Rfl:    dapagliflozin propanediol (FARXIGA) 10 MG TABS tablet, Take 1 tablet (10 mg total) by mouth daily before breakfast., Disp: 90 tablet, Rfl: 3   ezetimibe (ZETIA) 10 MG tablet, Take 1 tablet by mouth once daily, Disp: 90 tablet, Rfl: 0   feeding supplement (ENSURE ENLIVE / ENSURE PLUS) LIQD, Take 237 mLs by mouth 2 (two) times daily between meals., Disp: 237 mL, Rfl: 12   furosemide (LASIX) 20 MG tablet, Take 1 tablet (20 mg total) by mouth daily as needed. Take 1 tablet as needed for weight gain of 1 pound or more in 24 hours or 5 pounds or more in 1 week, Disp: 30 tablet, Rfl: 3   levothyroxine (SYNTHROID) 25 MCG tablet, Take 25 mcg by mouth  daily., Disp: , Rfl:    Lutein 6 MG TABS, Take 6 mg by mouth daily., Disp: , Rfl:    MAGNESIUM-OXIDE 400 (240 Mg) MG tablet, Take 1 tablet by mouth once daily, Disp: 90 tablet, Rfl: 0   metoprolol succinate (TOPROL-XL) 25 MG 24 hr tablet, TAKE 1 TABLET BY MOUTH ONCE DAILY. HOLD IF TOP BLOOD PRESSURE NUMBER IS LESS THAN 100 MMHG OR PILSE LES THAN 60 BPM (Patient taking differently: Take 25 mg by mouth daily. HOLD IF TOP BLOOD PRESSURE NUMBER IS LESS THAN 100 MMHG OR PILSE LES THAN 60 BPM), Disp: 30 tablet, Rfl: 3   Multiple Vitamins-Minerals (MULTIVITAMIN PO), Take 1 tablet by mouth daily., Disp: , Rfl:    Omega-3 Fatty Acids (FISH OIL) 1000 MG CAPS, Take 1,000 mg by mouth daily., Disp: , Rfl:    OVER THE COUNTER MEDICATION, Take 1 tablet by mouth daily. Super Beta Prostate Advanced, Disp: , Rfl:    Rivaroxaban (XARELTO) 15 MG TABS tablet, Take 1 tablet (15 mg total) by mouth daily with supper., Disp: 90 tablet, Rfl: 0   clobetasol cream (TEMOVATE) 0.05 %, Apply 1 application topically 2 (two) times daily as needed (eczema)., Disp: , Rfl:    sacubitril-valsartan (ENTRESTO) 49-51 MG, Take 1 tablet by mouth 2 (two) times daily., Disp: 180 tablet, Rfl: 0   spironolactone (ALDACTONE) 25 MG tablet, Take 0.5 tablets (12.5 mg total) by mouth every morning., Disp: 90 tablet, Rfl: 3  Orders Placed This Encounter  Procedures   Basic metabolic panel   Pro b natriuretic peptide (BNP)    There are no Patient Instructions on file for this visit.   --Continue cardiac medications as reconciled in final medication list. --Return in about 6 months (around 02/26/2023) for Follow up, heart failure management, s/p AVR. Or sooner if needed. --Continue follow-up with your primary care physician regarding the management of your other chronic comorbid conditions.  Patient's questions and concerns were addressed to his satisfaction. He voices understanding of the instructions provided during this encounter.   This  note was created using a voice recognition software as a result there may be grammatical errors inadvertently enclosed that do not  reflect the nature of this encounter. Every attempt is made to correct such errors.  Tessa Lerner, [place], [facility]  Pager:  [phone] Office: [phone]

## 2022-09-04 LAB — BASIC METABOLIC PANEL
BUN/Creatinine Ratio: 15 (ref 10–24)
BUN: 23 mg/dL (ref 8–27)
CO2: 19 mmol/L — ABNORMAL LOW (ref 20–29)
Calcium: 8.4 mg/dL — ABNORMAL LOW (ref 8.6–10.2)
Chloride: 104 mmol/L (ref 96–106)
Creatinine, Ser: 1.54 mg/dL — ABNORMAL HIGH (ref 0.76–1.27)
Glucose: 90 mg/dL (ref 70–99)
Potassium: 4.4 mmol/L (ref 3.5–5.2)
Sodium: 138 mmol/L (ref 134–144)
eGFR: 45 mL/min/{1.73_m2} — ABNORMAL LOW (ref >59)

## 2022-09-04 LAB — PRO B NATRIURETIC PEPTIDE: NT-Pro BNP: 305 pg/mL (ref 0–486)

## 2022-09-05 ENCOUNTER — Other Ambulatory Visit: Payer: Self-pay

## 2022-09-05 DIAGNOSIS — I5032 Chronic diastolic (congestive) heart failure: Secondary | ICD-10-CM

## 2022-09-05 NOTE — Progress Notes (Signed)
Called and spoke with patient regarding his recent lab results. Labs ordered.

## 2022-09-05 NOTE — Progress Notes (Signed)
Called patient, NA, LMAM

## 2022-09-16 ENCOUNTER — Other Ambulatory Visit: Payer: Self-pay

## 2022-09-16 DIAGNOSIS — Z7901 Long term (current) use of anticoagulants: Secondary | ICD-10-CM

## 2022-09-16 MED ORDER — RIVAROXABAN 15 MG PO TABS
15.0000 mg | ORAL_TABLET | Freq: Every day | ORAL | 0 refills | Status: DC
Start: 2022-09-16 — End: 2022-12-10

## 2022-09-19 ENCOUNTER — Other Ambulatory Visit: Payer: Self-pay

## 2022-10-10 ENCOUNTER — Telehealth: Payer: Self-pay

## 2022-10-10 NOTE — Telephone Encounter (Signed)
Patient assistance has been faxed for farxiga and entresto

## 2022-10-14 ENCOUNTER — Other Ambulatory Visit: Payer: Self-pay | Admitting: Cardiology

## 2022-10-14 DIAGNOSIS — I5041 Acute combined systolic (congestive) and diastolic (congestive) heart failure: Secondary | ICD-10-CM

## 2022-10-28 NOTE — Progress Notes (Signed)
Called patient.  Left VM

## 2022-10-28 NOTE — Progress Notes (Signed)
Spoke to patients wife and she understood and we went over meds

## 2022-11-02 ENCOUNTER — Other Ambulatory Visit: Payer: Self-pay | Admitting: Cardiology

## 2022-11-02 DIAGNOSIS — I2510 Atherosclerotic heart disease of native coronary artery without angina pectoris: Secondary | ICD-10-CM

## 2022-11-02 DIAGNOSIS — Z951 Presence of aortocoronary bypass graft: Secondary | ICD-10-CM

## 2022-11-02 DIAGNOSIS — E782 Mixed hyperlipidemia: Secondary | ICD-10-CM

## 2022-12-09 ENCOUNTER — Other Ambulatory Visit: Payer: Self-pay | Admitting: Cardiology

## 2022-12-09 DIAGNOSIS — Z7901 Long term (current) use of anticoagulants: Secondary | ICD-10-CM

## 2023-01-16 ENCOUNTER — Other Ambulatory Visit: Payer: Self-pay | Admitting: Cardiology

## 2023-01-16 DIAGNOSIS — I5041 Acute combined systolic (congestive) and diastolic (congestive) heart failure: Secondary | ICD-10-CM

## 2023-01-27 ENCOUNTER — Other Ambulatory Visit: Payer: Self-pay | Admitting: Cardiology

## 2023-01-27 DIAGNOSIS — E782 Mixed hyperlipidemia: Secondary | ICD-10-CM

## 2023-01-27 DIAGNOSIS — I2510 Atherosclerotic heart disease of native coronary artery without angina pectoris: Secondary | ICD-10-CM

## 2023-01-27 DIAGNOSIS — Z951 Presence of aortocoronary bypass graft: Secondary | ICD-10-CM

## 2023-02-15 ENCOUNTER — Other Ambulatory Visit: Payer: Self-pay

## 2023-02-24 ENCOUNTER — Telehealth: Payer: Self-pay | Admitting: Cardiology

## 2023-02-24 ENCOUNTER — Other Ambulatory Visit: Payer: Self-pay

## 2023-02-24 MED ORDER — DAPAGLIFLOZIN PROPANEDIOL 10 MG PO TABS: 10.0000 mg | ORAL_TABLET | Freq: Every day | ORAL | 2 refills | Status: DC

## 2023-02-24 NOTE — Telephone Encounter (Signed)
*  STAT* If patient is at the pharmacy, call can be transferred to refill team.   1. Which medications need to be refilled? (please list name of each medication and dose if known)   dapagliflozin propanediol (FARXIGA) 10 MG TABS tablet    2. Would you like to learn more about the convenience, safety, & potential cost savings by using the [facility]?   3. Are you open to using the [facility] (Type [facility]. ).  4. Which pharmacy/location (including street and city if local pharmacy) is medication to be sent to?  [facility] [address] [place] - [address]   5. Do they need a 30 day or 90 day supply?   90 day  Patient stated he has 6 tablets left.  Patient has appointment scheduled on

## 2023-02-25 ENCOUNTER — Other Ambulatory Visit: Payer: Self-pay | Admitting: Cardiology

## 2023-02-25 DIAGNOSIS — Z7901 Long term (current) use of anticoagulants: Secondary | ICD-10-CM

## 2023-02-25 NOTE — Telephone Encounter (Signed)
Prescription refill request for Xarelto received.  Indication: AF Last office visit: 08/27/22 Emelda Brothers MD Weight:80.4kg Age: 82 Scr: 1.10 on 02/11/23 CrCl: 59.89  Continue Xarelto 15mg  daily due to H/O elevated CrCl per last office note.  Refill approved.

## 2023-02-27 ENCOUNTER — Ambulatory Visit: Payer: Self-pay | Admitting: Cardiology

## 2023-03-04 ENCOUNTER — Ambulatory Visit: Payer: Medicare HMO | Admitting: Cardiology

## 2023-03-20 ENCOUNTER — Telehealth: Payer: Self-pay

## 2023-03-20 ENCOUNTER — Other Ambulatory Visit (HOSPITAL_COMMUNITY): Payer: Self-pay

## 2023-03-20 NOTE — Telephone Encounter (Signed)
Pharmacy Patient Advocate Encounter   Received notification from CoverMyMeds that prior authorization for XARELTO is required/requested.   Insurance verification completed.   The patient is insured through [employer] .   Per test claim: PA required; PA submitted to above mentioned insurance via CoverMyMeds Key/confirmation #/EOC [id number] Status is pending

## 2023-03-20 NOTE — Telephone Encounter (Signed)
Pharmacy Patient Advocate Encounter  Received notification from [facility] that Prior Authorization for Keith Wagner has been APPROVED from 03/20/23 to 03/24/24

## 2023-03-25 ENCOUNTER — Other Ambulatory Visit: Payer: Self-pay | Admitting: Cardiology

## 2023-03-25 DIAGNOSIS — I5032 Chronic diastolic (congestive) heart failure: Secondary | ICD-10-CM

## 2023-03-31 ENCOUNTER — Telehealth: Payer: Self-pay | Admitting: Cardiology

## 2023-03-31 NOTE — Telephone Encounter (Signed)
*  STAT* If patient is at the pharmacy, call can be transferred to refill team.   1. Which medications need to be refilled? (please list name of each medication and dose if known) ENTRESTO    2. Would you like to learn more about the convenience, safety, & potential cost savings by using the [facility]? NO   3. Are you open to using the [facility] NO  4. Which pharmacy/location (including street and city if local pharmacy) is medication to be sent to?  [id number] - [place], [place] - [address]     5. Do they need a 30 day or 90 day supply? 90

## 2023-03-31 NOTE — Telephone Encounter (Signed)
 Pt is requesting a refill on Entresto. This medication was D/C off of pt's medication list. Does Dr. Odis Hollingshead want pt to still take this medication? Please address

## 2023-04-01 ENCOUNTER — Other Ambulatory Visit: Payer: Self-pay

## 2023-04-01 ENCOUNTER — Telehealth: Payer: Self-pay | Admitting: Cardiology

## 2023-04-01 DIAGNOSIS — I5032 Chronic diastolic (congestive) heart failure: Secondary | ICD-10-CM

## 2023-04-01 MED ORDER — ENTRESTO 49-51 MG PO TABS
1.0000 | ORAL_TABLET | Freq: Two times a day (BID) | ORAL | 3 refills | Status: AC
Start: 2023-04-01 — End: ?

## 2023-04-01 NOTE — Telephone Encounter (Signed)
 Pt c/o medication issue:  1. Name of Medication:   sacubitril -valsartan  (ENTRESTO ) 97-103   2. How are you currently taking this medication (dosage and times per day)?   3. Are you having a reaction (difficulty breathing--STAT)?   4. What is your medication issue?   Patient stated he has still been taking this medication and wants to know if he should continue this medication.  Patient stated he is running out of this medication and will need a refill sent to [facility] [address] [place] - [address].

## 2023-04-01 NOTE — Telephone Encounter (Signed)
 Spoke with pt over the phone and verified that the dose the pt has been taking of Entresto  was the 49-51 mg. Per Dr. Tyree OV note from June 2024, he had decreased the pt's dose from 97-103 to 49-51 but a new order for the 49-51 mg is not in the active medication list. Pt states PCP has not altered medication and he has been taking the 49-51 mg dose since Dr. Michele switched him in June. New order placed for Entresto  49-51 mg and sent to pharmacy. Pt is aware and has no questions.

## 2023-04-01 NOTE — Telephone Encounter (Signed)
 Refer to telephone encounter from 03/31/23.

## 2023-05-29 ENCOUNTER — Encounter: Payer: Self-pay | Admitting: Cardiology

## 2023-05-29 ENCOUNTER — Ambulatory Visit: Payer: Medicare HMO | Attending: Cardiology | Admitting: Cardiology

## 2023-05-29 ENCOUNTER — Ambulatory Visit (INDEPENDENT_AMBULATORY_CARE_PROVIDER_SITE_OTHER)

## 2023-05-29 VITALS — BP 105/60 | HR 67 | Resp 16 | Ht 66.0 in | Wt 176.0 lb

## 2023-05-29 DIAGNOSIS — Z7901 Long term (current) use of anticoagulants: Secondary | ICD-10-CM

## 2023-05-29 DIAGNOSIS — I493 Ventricular premature depolarization: Secondary | ICD-10-CM

## 2023-05-29 DIAGNOSIS — I10 Essential (primary) hypertension: Secondary | ICD-10-CM

## 2023-05-29 DIAGNOSIS — Z952 Presence of prosthetic heart valve: Secondary | ICD-10-CM | POA: Diagnosis not present

## 2023-05-29 DIAGNOSIS — Z87891 Personal history of nicotine dependence: Secondary | ICD-10-CM

## 2023-05-29 DIAGNOSIS — Z951 Presence of aortocoronary bypass graft: Secondary | ICD-10-CM

## 2023-05-29 DIAGNOSIS — I5032 Chronic diastolic (congestive) heart failure: Secondary | ICD-10-CM | POA: Diagnosis not present

## 2023-05-29 DIAGNOSIS — I480 Paroxysmal atrial fibrillation: Secondary | ICD-10-CM

## 2023-05-29 LAB — MAGNESIUM: Magnesium: 2.3 mg/dL (ref 1.6–2.3)

## 2023-05-29 LAB — COMPREHENSIVE METABOLIC PANEL
ALT: 19 IU/L (ref 0–44)
AST: 25 IU/L (ref 0–40)
Albumin: 3.9 g/dL (ref 3.7–4.7)
Alkaline Phosphatase: 75 IU/L (ref 44–121)
BUN/Creatinine Ratio: 13 (ref 10–24)
BUN: 17 mg/dL (ref 8–27)
Bilirubin Total: 1 mg/dL (ref 0.0–1.2)
CO2: 21 mmol/L (ref 20–29)
Calcium: 9.1 mg/dL (ref 8.6–10.2)
Chloride: 104 mmol/L (ref 96–106)
Creatinine, Ser: 1.3 mg/dL — ABNORMAL HIGH (ref 0.76–1.27)
Globulin, Total: 2 g/dL (ref 1.5–4.5)
Glucose: 108 mg/dL — ABNORMAL HIGH (ref 70–99)
Potassium: 3.9 mmol/L (ref 3.5–5.2)
Sodium: 139 mmol/L (ref 134–144)
Total Protein: 5.9 g/dL — ABNORMAL LOW (ref 6.0–8.5)
eGFR: 55 mL/min/{1.73_m2} — ABNORMAL LOW (ref >59)

## 2023-05-29 NOTE — Progress Notes (Unsigned)
 Enrolled patient for a 3 day Zio XT monitor to be mailed to patients home

## 2023-05-29 NOTE — Progress Notes (Signed)
 Cardiology Office Note:  .   ID:  Keith Wagner, DOB September 11, 1940, MRN [id number] PCP:  Richmond Campbell PA-C  Former Cardiology Providers: Altamese Davenport, APRN, FNP-C  [facility] Providers Cardiologist:  Tessa Lerner, DO , [facility] (established care 06/10/2019 ) Electrophysiologist:  None  Click to update primary MD,subspecialty MD or APP then REFRESH:1}    Chief Complaint  Patient presents with   Chronic heart failure with preserved ejection fraction   Follow-up    6 month    History of Present Illness: .   Keith Wagner is a 83 y.o.  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  Patient being followed in the practice given his history of CAD status post surgical revascularization, status post bioprosthetic AVR 21 mm Edward InspirEase Resilia, and heart failure.  In September 2022 and underwent an echocardiogram which noted reduced LVEF along with grade 2 diastolic impairment and elevated left atrial pressure. 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 On March 01, 2021 he underwent two-vessel CABG (LIMA to the LAD and SVG to OM) and a bioprosthetic aortic valve with 21 mm Edwards Inspiris Resilia valve. 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  Patient was last seen in the office in June 2024 and today presents for follow-up.  Since last office visit patient denies any anginal chest pain or heart failure symptoms.  No hospitalizations or urgent care visits for  cardiovascular reasons.  He has been compliant with his medical therapy.  No significant weight gain.  Physical endurance remains stable, walks 30 minutes a day. Home SBP ranges between 105-123mmHG remains asymptomatic.    Review of Systems: .   Review of Systems  Cardiovascular:  Negative for chest pain, claudication, dyspnea on exertion, irregular heartbeat, leg swelling, near-syncope, orthopnea, palpitations, paroxysmal nocturnal dyspnea and syncope.  Respiratory:  Negative for shortness of breath.   Hematologic/Lymphatic: Negative for bleeding problem.  Musculoskeletal:  Negative for muscle cramps and myalgias.  Neurological:  Negative for dizziness and light-headedness.    Studies Reviewed:   CABG x2 on 03/01/2021: LIMA to LAD and SVG to OM and aortic valve replacement with a 21 mm Edwards InspirEase Resilia bioprosthetic valve.   Temporary Pacer Placement 03/03/2021.  EKG: EKG Interpretation Date/Time:  Thursday May 29 2023 07:56:29 EST Ventricular Rate:  66 PR Interval:  206 QRS Duration:  106 QT Interval:  426 QTC Calculation: 446 R Axis:   98  Text Interpretation: Sinus rhythm with frequent Premature ventricular complexes in a pattern of bigeminy Rightward axis Septal infarct (cited on or before 23-Aug-2022) When compared with ECG of 23-Aug-2022 12:01, Premature ventricular complexes are now Present Confirmed by Tessa Lerner [phone]) on 05/29/2023 8:06:01 AM  Echocardiogram: 05/25/2019: LVEF 50-55%, grade 1 diastolic impairment   12/07/2020: LVEF 30-35%, moderate LVH, global HK, grade 2 DD, elevated LAP, mild LAE, moderate AAS, moderate AR, moderate MR, mild TR, mild PHTN.   03/03/2021:  1. Left ventricular ejection fraction, by estimation, is 50 to 55%. Left ventricular ejection fraction by PLAX is 45 %. The left ventricle has low normal  function. The left ventricle has no regional wall motion normalities. There is mild left ventricular hypertrophy. Left ventricular  diastolic parameters are consistent with Grade II diastolic dysfunction (pseudonormalization). Elevated left ventricular end-diastolic pressure.   2. Right ventricular systolic function is normal. The right ventricular size is normal.   3. Left atrial size was moderately dilated.   4. The mitral valve was not well visualized. Trivial mitral valve regurgitation.   5. The aortic valve was not well visualized. Aortic valve regurgitation is not visualized. Mild aortic valve stenosis. There is a 21 mm Edwards InspirEase Resilia valve present in the aortic position. Aortic valve area, by VTI measures 0.98 cm.  Aortic valve mean gradient measures 17.5 mmHg. Aortic valve Vmax measures 2.77 m/s.   6. The inferior vena cava is dilated in size with <50% respiratory variability, suggesting right atrial pressure of 15 mmHg.   Heart Catheterization: LHC / RHC / Thoracic Aortogram 02/06/2021: LM: Distal calcific 75% stenosis LAD: Mid 80% stenosis LCX: large vessel. Ostial 70% stenosis.  RCA: Dominant, but small vessel. No significant disease seen on well opacified non-selective angiogram.   Normal PCW Unable to cross aortic valve due to severe aorta tortuosity.   CVTS consult for CABG +/- AVR   Carotid artery duplex 12/04/2017: Minimal stenosis in the right internal carotid artery (1-15%). Minimal stenosis in the left internal carotid artery (1-15%). Antegrade right vertebral artery flow. Antegrade left vertebral artery flow. Follow up when clinically indicated.  RADIOLOGY: NA  Risk Assessment/Calculations:   Click Here to Calculate/Change CHADS2VASc Score The patient's CHADS2-VASc score is 5, indicating a 7.2% annual risk of stroke.    Labs:       Latest Ref Rng & Units 05/29/2023    9:14 AM 08/23/2022   12:29 PM 08/16/2022   11:24 AM  CBC  WBC 4.0 - 10.5 K/uL  9.5    Hemoglobin 13.0 - 17.7 g/dL 16.1  09.6  04.5   Hematocrit 37.5 - 51.0 % 48.4  47.5  46.0   Platelets 150 - 400 K/uL  223          Latest Ref Rng & Units 05/29/2023    9:11 AM 10/25/2022    9:47 AM 09/03/2022    9:30 AM  BMP  Glucose 70 - 99 mg/dL 409  94  90   BUN 8 - 27 mg/dL 17  23  23    Creatinine 0.76 - 1.27 mg/dL 8.11  9.14  7.82   BUN/Creat Ratio 10 - 24 13  19  15    Sodium 134 - 144 mmol/L 139  139  138   Potassium 3.5 - 5.2 mmol/L 3.9  4.8  4.4   Chloride 96 - 106 mmol/L 104  104  104   CO2 20 - 29 mmol/L 21  20  19    Calcium 8.6 - 10.2 mg/dL 9.1  9.3  8.4       Latest Ref Rng & Units 05/29/2023    9:11 AM 10/25/2022    9:47 AM 09/03/2022    9:30 AM  CMP  Glucose 70 - 99 mg/dL 956  94  90   BUN 8 - 27 mg/dL 17  23  23    Creatinine 0.76 - 1.27 mg/dL 2.13  0.86  5.78   Sodium 134 - 144 mmol/L 139  139  138   Potassium 3.5 - 5.2 mmol/L 3.9  4.8  4.4   Chloride 96 - 106 mmol/L 104  104  104   CO2  20 - 29 mmol/L 21  20  19    Calcium 8.6 - 10.2 mg/dL 9.1  9.3  8.4   Total Protein 6.0 - 8.5 g/dL 5.9     Total Bilirubin 0.0 - 1.2 mg/dL 1.0     Alkaline Phos 44 - 121 IU/L 75     AST 0 - 40 IU/L 25     ALT 0 - 44 IU/L 19       No results found for: "CHOL", "HDL", "LDLCALC", "LDLDIRECT", "TRIG", "CHOLHDL" No results for input(s): "LIPOA" in the last 8760 hours. No components found for: "NTPROBNP" Recent Labs    08/16/22 1124 09/03/22 0930 10/25/22 0947  PROBNP 137 305 320   Recent Labs    05/29/23 0914  TSH 2.790    Physical Exam:    Today's Vitals   05/29/23 0752  BP: 105/60  Pulse: 67  Resp: 16  SpO2: 98%  Weight: 176 lb (79.8 kg)  Height: 5\' 6"  (1.676 m)   Body mass index is 28.41 kg/m. Wt Readings from Last 3 Encounters:  05/29/23 176 lb (79.8 kg)  08/27/22 177 lb 3.2 oz (80.4 kg)  08/23/22 169 lb (76.7 kg)    Physical Exam  Constitutional: No distress.  hemodynamically stable  Neck: No JVD present.  Cardiovascular: Normal rate, regular rhythm, S1 normal and S2 normal. Exam reveals no gallop, no S3 and no S4.  No murmur heard. Pulmonary/Chest: Effort normal and breath  sounds normal. No stridor. He has no wheezes. He has no rales.  Musculoskeletal:        General: No edema.     Cervical back: Neck supple.  Skin: Skin is warm.     Impression & Recommendation(s):  Impression:   ICD-10-CM   1. Chronic heart failure with preserved ejection fraction (HFpEF) (HCC)  I50.32 EKG 12-Lead    2. S/P CABG x 2  Z95.1 ECHOCARDIOGRAM COMPLETE    3. S/P AVR  Z95.2     4. PVC (premature ventricular contraction)  I49.3 Comprehensive metabolic panel    Magnesium    TSH    Hemoglobin and hematocrit, blood    LONG TERM MONITOR (3-14 DAYS)    Magnesium    Comprehensive metabolic panel    5. Paroxysmal atrial fibrillation (HCC)  I48.0     6. Long term (current) use of anticoagulants  Z79.01     7. Benign essential hypertension  I10     8. Former smoker  Z87.891        Recommendation(s):  Chronic heart failure with preserved ejection fraction (HFpEF) (HCC) S/P CABG x 2 Stage C, NYHA class I/II December 07, 2020: LVEF 30-35%. December 2022: LVEF 50-55% Currently on appropriate GDMT. Weight remains stable since last office visit. Systolic blood pressures at home are soft. Currently on spironolactone 12.5 mg p.o. daily, recommend holding it for now to prevent hypotension. Recheck labs CMP, BNP Will provide patient assistance forms for both Entresto and Farxiga.  S/P AVR Fingerprint echo March 03, 2021. Echo will be ordered to evaluate for structural heart disease and left ventricular systolic function. Patient is aware of antibiotic prophylaxis prior to procedures.  PVC (premature ventricular contraction) New EKG today illustrates frequent PVCs in a bigeminal pattern Already on Toprol-XL 25 mg p.o. daily. 3-day Zio patch to evaluate for dysrhythmia and PVC burden Further recommendations to follow Potassium and magnesium level TSH.  Paroxysmal atrial fibrillation (HCC) Long term (current) use of anticoagulants Rate control: Metoprolol. Rhythm  control: N/A, history of  amiodarone use Thromboembolic prophylaxis: Xarelto. Does not endorse evidence of bleeding. Check H&H EKG today illustrates sinus rhythm  Benign essential hypertension Office and home blood pressure still remains soft. Will hold off on spironolactone 25 mg p.o. daily for reasons mentioned above. Reemphasized importance of low-salt diet.   Orders Placed:  Orders Placed This Encounter  Procedures   Comprehensive metabolic panel    Standing Status:   Future    Number of Occurrences:   1    Expected Date:   06/05/2023    Expiration Date:   05/28/2024   Magnesium    Standing Status:   Future    Number of Occurrences:   1    Expiration Date:   05/28/2024   TSH   Hemoglobin and hematocrit, blood   LONG TERM MONITOR (3-14 DAYS)    Standing Status:   Future    Number of Occurrences:   1    Expected Date:   06/05/2023    Expiration Date:   05/28/2024    Where should this test be performed?:   [facility]    Does the patient have an implanted cardiac device?:   No    Prescribed days of wear:   3    Type of enrollment:   Home Enrollment    Vendor::   Zio   EKG 12-Lead   ECHOCARDIOGRAM COMPLETE    Standing Status:   Future    Expected Date:   06/05/2023    Expiration Date:   05/28/2024    Where should this test be performed:   [facility])    Does the patient weigh less than or greater than 250 lbs?:   Patient weighs less than 250 lbs    Perflutren DEFINITY (image enhancing agent) should be administered unless hypersensitivity or allergy exist:   Administer Perflutren    Reason for exam-Echo:   Other-Full Diagnosis List    Full ICD-10/Reason for Exam:   (HFpEF) heart failure with preserved ejection fraction (HCC) [[id number]]    Final Medication List:   No orders of the defined types were placed in this encounter.   Medications Discontinued During This Encounter  Medication Reason   feeding supplement (ENSURE ENLIVE / ENSURE PLUS) LIQD  Patient Preference   spironolactone (ALDACTONE) 25 MG tablet Discontinued by provider     Current Outpatient Medications:    acetaminophen (TYLENOL) 500 MG tablet, Take 1,000 mg by mouth every 6 (six) hours as needed for moderate pain., Disp: , Rfl:    aspirin 81 MG tablet, Take 81 mg by mouth daily., Disp: , Rfl:    atorvastatin (LIPITOR) 40 MG tablet, Take 1 tablet (40 mg total) by mouth daily., Disp: 30 tablet, Rfl: 3   calcium carbonate (TUMS - DOSED IN MG ELEMENTAL CALCIUM) 500 MG chewable tablet, Chew 500-1,000 mg by mouth daily as needed for indigestion or heartburn., Disp: , Rfl:    clobetasol cream (TEMOVATE) 0.05 %, Apply 1 application topically 2 (two) times daily as needed (eczema)., Disp: , Rfl:    dapagliflozin propanediol (FARXIGA) 10 MG TABS tablet, Take 1 tablet (10 mg total) by mouth daily before breakfast., Disp: 90 tablet, Rfl: 2   ezetimibe (ZETIA) 10 MG tablet, Take 1 tablet by mouth once daily, Disp: 90 tablet, Rfl: 2   furosemide (LASIX) 20 MG tablet, TAKE 1 TABLET BY MOUTH ONCE DAILY AS NEEDED FOR WEIGHT GAIN OF 1 POUND OR MORE IN 24 HOURS OR 5 POUNDS OR MORE IN 1  WEEK, Disp: 30 tablet, Rfl: 2   levothyroxine (SYNTHROID) 25 MCG tablet, Take 25 mcg by mouth daily., Disp: , Rfl:    Lutein 6 MG TABS, Take 6 mg by mouth daily., Disp: , Rfl:    MAGnesium-Oxide 400 (240 Mg) MG tablet, Take 1 tablet by mouth once daily, Disp: 90 tablet, Rfl: 2   metoprolol succinate (TOPROL-XL) 25 MG 24 hr tablet, TAKE 1 TABLET BY MOUTH ONCE DAILY. HOLD IF TOP BLOOD PRESSURE NUMBER IS LESS THAN 100 MMHG OR PILSE LES THAN 60 BPM (Patient taking differently: Take 25 mg by mouth daily. HOLD IF TOP BLOOD PRESSURE NUMBER IS LESS THAN 100 MMHG OR PILSE LES THAN 60 BPM), Disp: 30 tablet, Rfl: 3   Multiple Vitamins-Minerals (MULTIVITAMIN PO), Take 1 tablet by mouth daily., Disp: , Rfl:    Omega-3 Fatty Acids (FISH OIL) 1000 MG CAPS, Take 1,000 mg by mouth daily., Disp: , Rfl:    OVER THE COUNTER  MEDICATION, Take 1 tablet by mouth daily. Super Beta Prostate Advanced, Disp: , Rfl:    Rivaroxaban (XARELTO) 15 MG TABS tablet, TAKE 1 TABLET BY MOUTH ONCE DAILY WITH SUPPER, Disp: 90 tablet, Rfl: 1   sacubitril-valsartan (ENTRESTO) 49-51 MG, Take 1 tablet by mouth 2 (two) times daily., Disp: 180 tablet, Rfl: 3  Consent:   NA  Disposition:   3 months sooner if needed.   His questions and concerns were addressed to his satisfaction. He voices understanding of the recommendations provided during this encounter.    Signed, Tessa Lerner, DO, [facility]  [facility]  [address] [place], [place] [address]

## 2023-05-29 NOTE — Patient Instructions (Addendum)
 Medication Instructions:  Your physician has recommended you make the following change in your medication:   STOP Spironolactone    *If you need a refill on your cardiac medications before your next appointment, please call your pharmacy*  Lab Work: To be completed today: CMP, TSH, Magnesium, and Hemoglobin/Hematocrit  If you have labs (blood work) drawn today and your tests are completely normal, you will receive your results only by: MyChart Message (if you have MyChart) OR A paper copy in the mail If you have any lab test that is abnormal or we need to change your treatment, we will call you to review the results.  Testing/Procedures: Your physician has requested that you have an echocardiogram. Echocardiography is a painless test that uses sound waves to create images of your heart. It provides your doctor with information about the size and shape of your heart and how well your heart's chambers and valves are working. This procedure takes approximately one hour. There are no restrictions for this procedure. Please do NOT wear cologne, perfume, aftershave, or lotions (deodorant is allowed). Please arrive 15 minutes prior to your appointment time.  Please note: We ask at that you not bring children with you during ultrasound (echo/ vascular) testing. Due to room size and safety concerns, children are not allowed in the ultrasound rooms during exams. Our front office staff cannot provide observation of children in our lobby area while testing is being conducted. An adult accompanying a patient to their appointment will only be allowed in the ultrasound room at the discretion of the ultrasound technician under special circumstances. We apologize for any inconvenience.   Your physician has requested that you wear a Zio heart monitor for 3 days. This will be mailed to your home with instructions on how to apply the monitor and how to return it when finished. Please allow 2 weeks after returning  the heart monitor before our office calls you with the results.   Follow-Up: At [facility], you and your health needs are our priority.  As part of our continuing mission to provide you with exceptional heart care, we have created designated Provider Care Teams.  These Care Teams include your primary Cardiologist (physician) and Advanced Practice Providers (APPs -  Physician Assistants and Nurse Practitioners) who all work together to provide you with the care you need, when you need it.  We recommend signing up for the patient portal called "MyChart".  Sign up information is provided on this After Visit Summary.  MyChart is used to connect with patients for Virtual Visits (Telemedicine).  Patients are able to view lab/test results, encounter notes, upcoming appointments, etc.  Non-urgent messages can be sent to your provider as well.   To learn more about what you can do with MyChart, go to [email or web address].    Your next appointment:   3 month(s)  The format for your next appointment:   In Person  Provider:   Tessa Lerner, DO {  Other Instructions ZIO XT- Long Term Monitor Instructions     Your physician has requested you wear a ZIO patch monitor for 3 days.  This is a single patch monitor. [employer] supplies one patch monitor per enrollment. Additional  stickers are not available. Please do not apply patch if you will be having a Nuclear Stress Test,  Echocardiogram, Cardiac CT, MRI, or Chest Xray during the period you would be wearing the  monitor. The patch cannot be worn during these tests. You cannot remove and re-apply the  ZIO XT patch monitor.  Your ZIO patch monitor will be mailed 3 day USPS to your address on file. It may take 3-5 days  to receive your monitor after you have been enrolled.  Once you have received your monitor, please review the enclosed instructions. Your monitor  has already been registered assigning a specific monitor serial # to you.     Billing  and Patient Assistance Program Information     We have supplied [employer] with any of your insurance information on file for billing purposes.  [employer] offers a sliding scale Patient Assistance Program for patients that do not have  insurance, or whose insurance does not completely cover the cost of the ZIO monitor.  You must apply for the Patient Assistance Program to qualify for this discounted rate.  To apply, please call [employer] at [phone], select option 4, select option 2, ask to apply for  Patient Assistance Program. Meredeth Ide will ask your household income, and how many people  are in your household. They will quote your out-of-pocket cost based on that information.  [employer] will also be able to set up a 57-month, interest-free payment plan if needed.     Applying the monitor     Shave hair from upper left chest.  Hold abrader disc by orange tab. Rub abrader in 40 strokes over the upper left chest as  indicated in your monitor instructions.  Clean area with 4 enclosed alcohol pads. Let dry.  Apply patch as indicated in monitor instructions. Patch will be placed under collarbone on left  side of chest with arrow pointing upward.  Rub patch adhesive wings for 2 minutes. Remove white label marked "1". Remove the white  label marked "2". Rub patch adhesive wings for 2 additional minutes.  While looking in a mirror, press and release button in center of patch. A small green light will  flash 3-4 times. This will be your only indicator that the monitor has been turned on.  Do not shower for the first 24 hours. You may shower after the first 24 hours.  Press the button if you feel a symptom. You will hear a small click. Record Date, Time and  Symptom in the Patient Logbook.  When you are ready to remove the patch, follow instructions on the last 2 pages of Patient  Logbook. Stick patch monitor onto the last page of Patient Logbook.  Place Patient Logbook in the blue and white box. Use  locking tab on box and tape box closed  securely. The blue and white box has prepaid postage on it. Please place it in the mailbox as  soon as possible. Your physician should have your test results approximately 7 days after the  monitor has been mailed back to [facility].  Call [facility] Customer Care at [phone] if you have questions regarding  your ZIO XT patch monitor. Call them immediately if you see an orange light blinking on your  monitor.  If your monitor falls off in less than 4 days, contact our Monitor department at [phone].  If your monitor becomes loose or falls off after 4 days call [employer] at [phone] for  suggestions on securing your monitor.

## 2023-05-30 LAB — HEMOGLOBIN AND HEMATOCRIT, BLOOD
Hematocrit: 48.4 % (ref 37.5–51.0)
Hemoglobin: 16.2 g/dL (ref 13.0–17.7)

## 2023-05-30 LAB — TSH: TSH: 2.79 u[IU]/mL (ref 0.450–4.500)

## 2023-06-10 ENCOUNTER — Encounter (HOSPITAL_COMMUNITY): Payer: Self-pay

## 2023-06-10 ENCOUNTER — Ambulatory Visit (HOSPITAL_COMMUNITY): Attending: Cardiology

## 2023-06-10 DIAGNOSIS — Z951 Presence of aortocoronary bypass graft: Secondary | ICD-10-CM | POA: Insufficient documentation

## 2023-06-10 DIAGNOSIS — I509 Heart failure, unspecified: Secondary | ICD-10-CM

## 2023-06-10 LAB — ECHOCARDIOGRAM COMPLETE
AR max vel: 1.73 cm2
AV Area VTI: 1.63 cm2
AV Area mean vel: 1.64 cm2
AV Mean grad: 12.5 mmHg
AV Peak grad: 22.7 mmHg
Ao pk vel: 2.38 m/s
Area-P 1/2: 3.92 cm2
S' Lateral: 2.6 cm

## 2023-06-10 MED ORDER — PERFLUTREN LIPID MICROSPHERE
1.0000 mL | INTRAVENOUS | Status: AC | PRN
  Administered 2023-06-10: 2 mL via INTRAVENOUS

## 2023-06-29 DIAGNOSIS — I493 Ventricular premature depolarization: Secondary | ICD-10-CM | POA: Diagnosis not present

## 2023-07-08 IMAGING — DX DG CHEST 2V
2 series · 2 of 2 positions shown · non-contrast
Comparison: None.

CLINICAL DATA: Preop evaluation.

EXAM:
CHEST - 2 VIEW

[w chest pa]
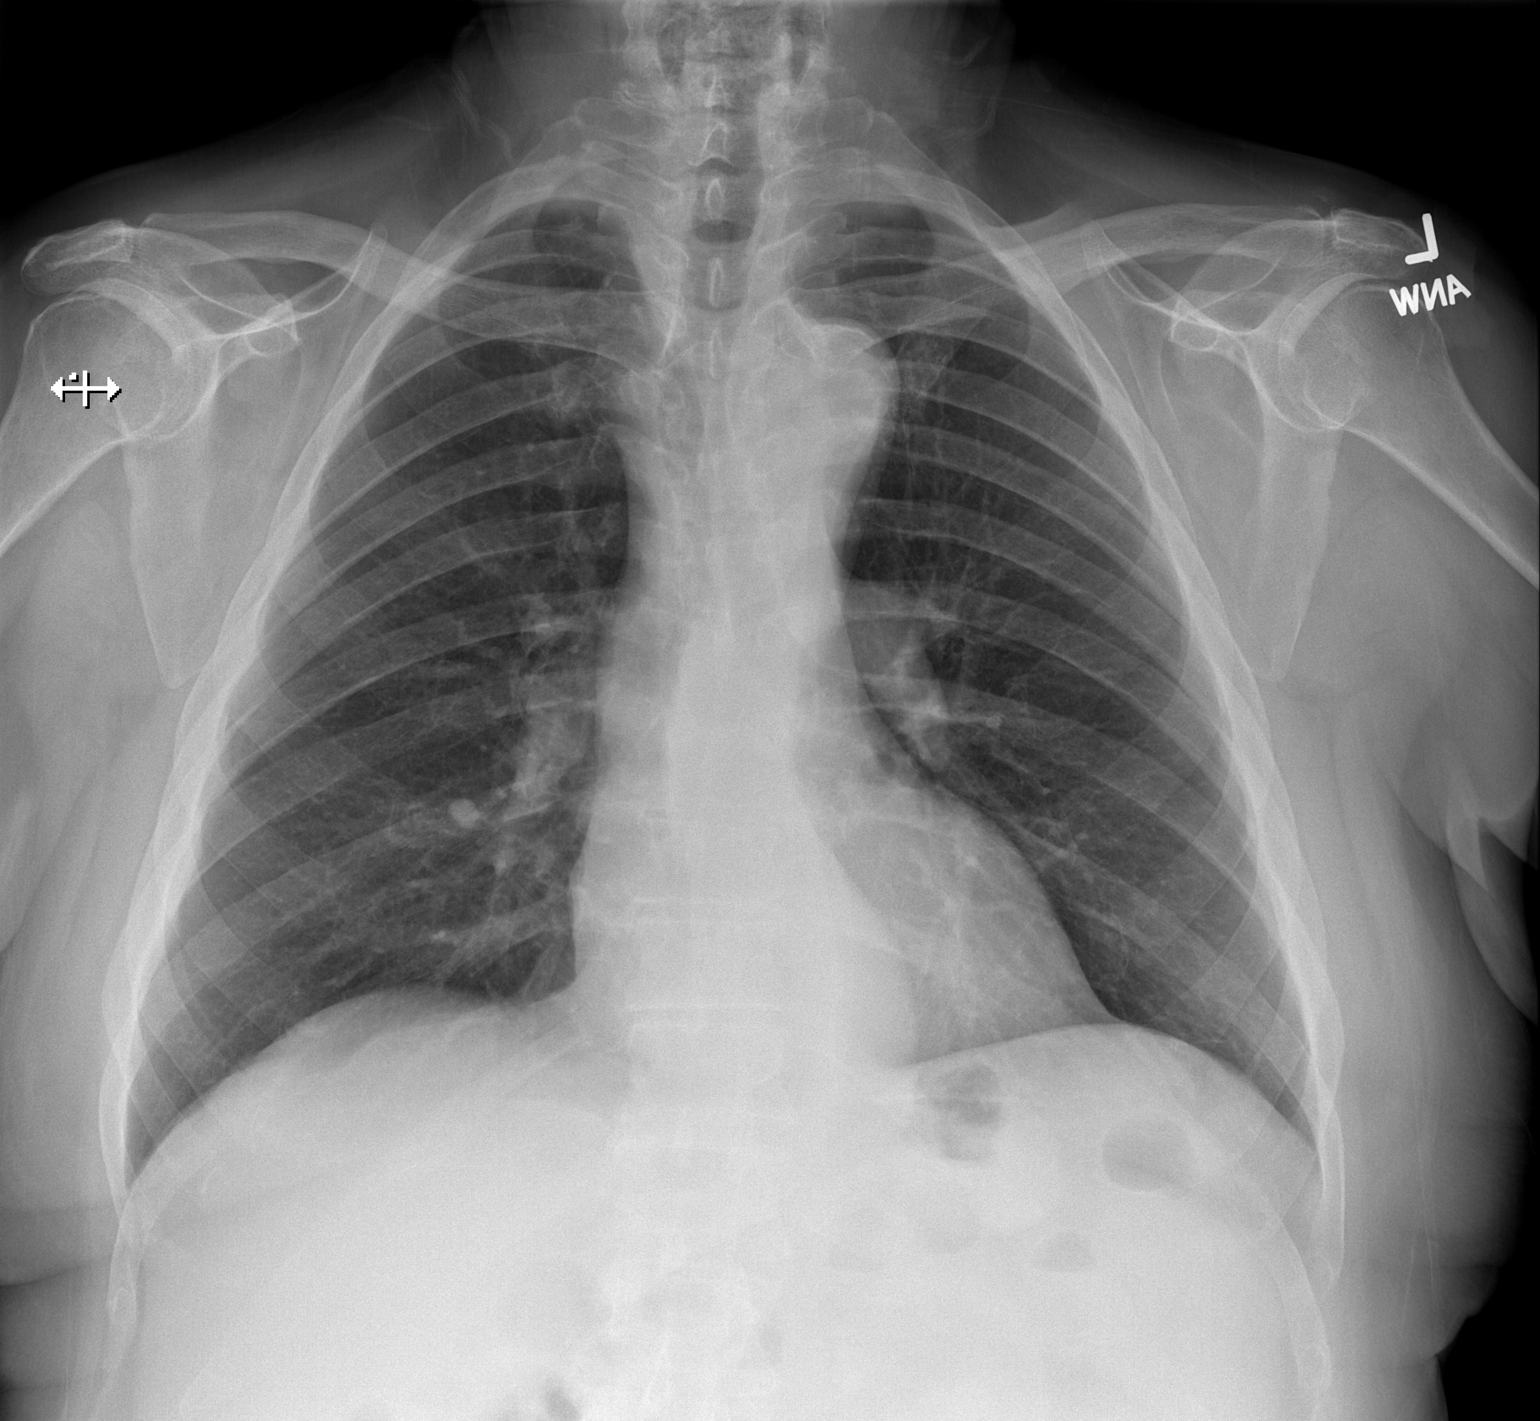

[w chest lat]
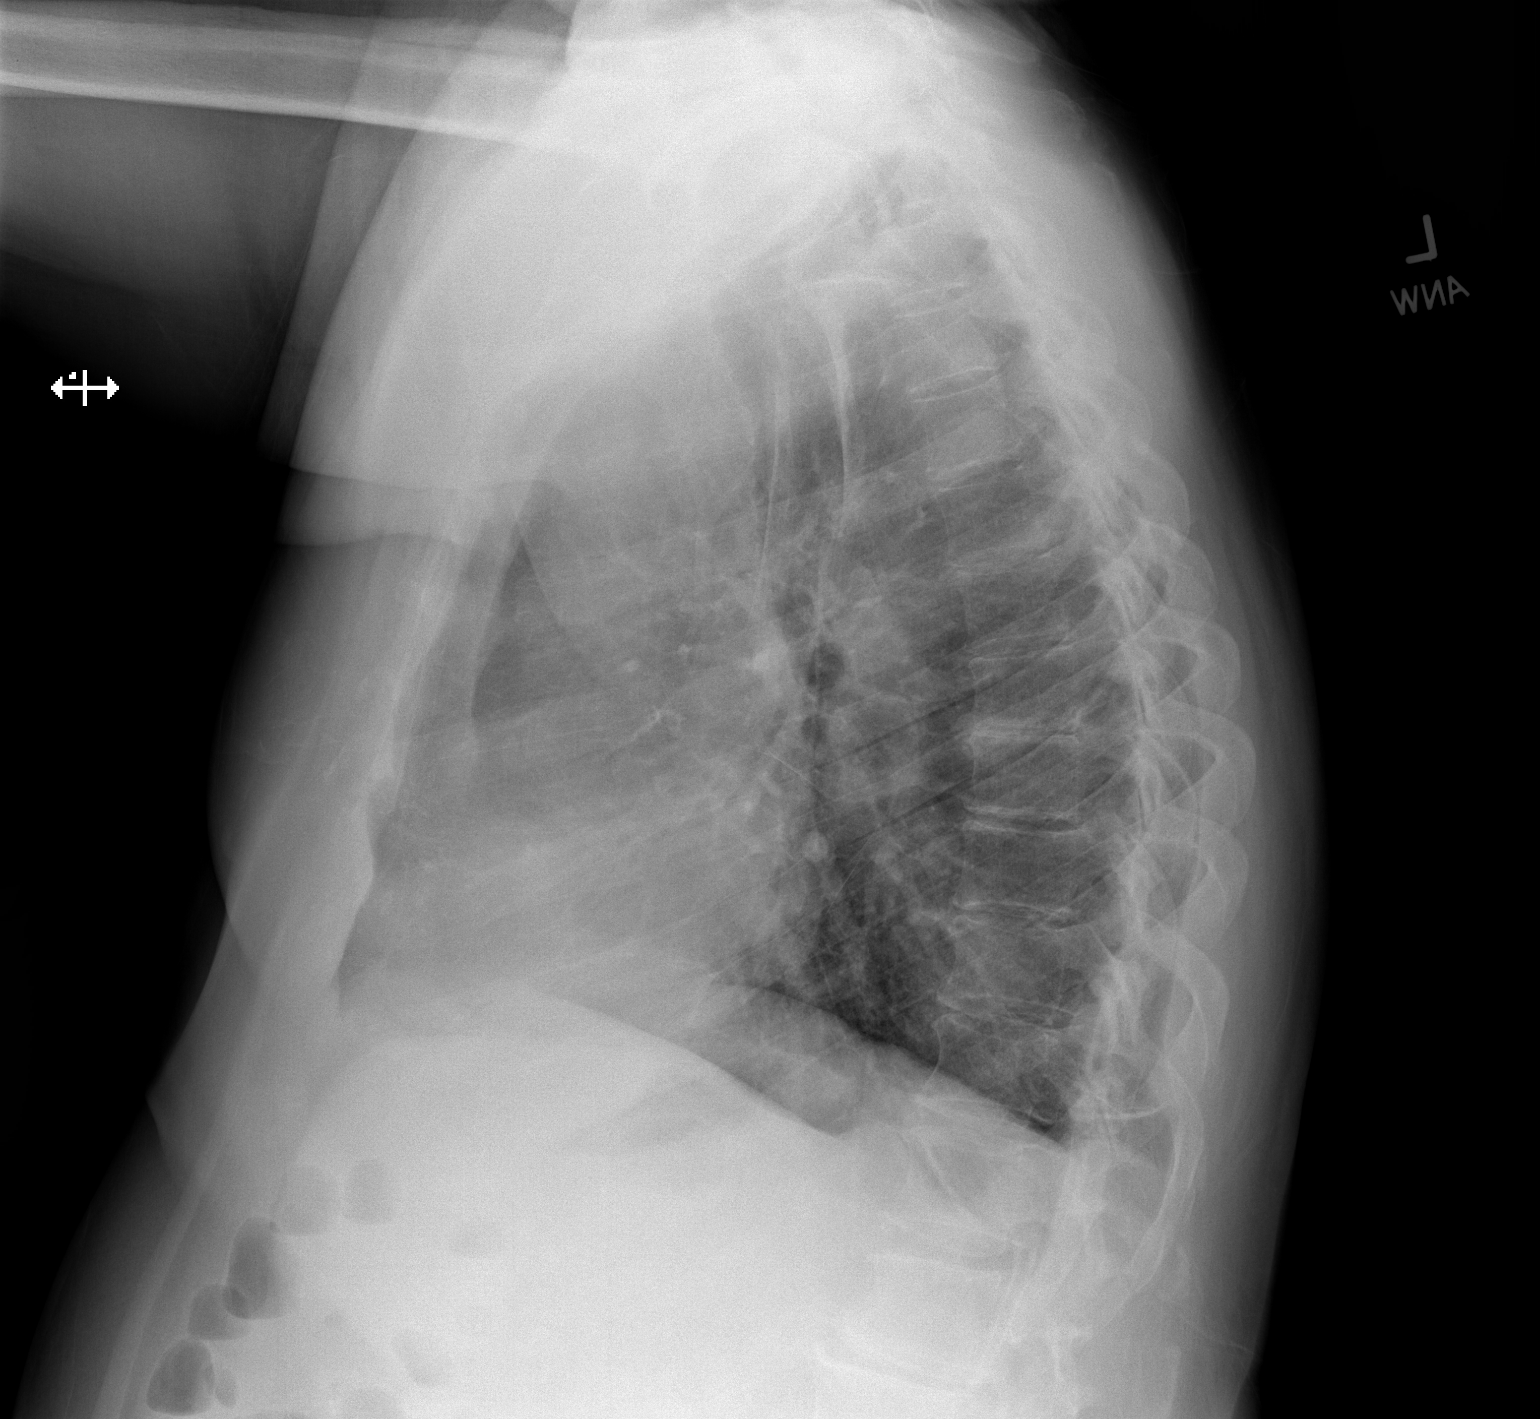

[2 of 2 positions shown; findings below may reference images not displayed]

FINDINGS: The heart size and mediastinal contours are within normal limits.
Atherosclerotic calcification of the aorta is noted. No
consolidation, effusion, or pneumothorax. No acute osseous
abnormality.
IMPRESSION: No active cardiopulmonary disease.

## 2023-07-11 ENCOUNTER — Other Ambulatory Visit: Payer: Self-pay

## 2023-07-11 DIAGNOSIS — I350 Nonrheumatic aortic (valve) stenosis: Secondary | ICD-10-CM

## 2023-07-11 MED ORDER — METOPROLOL SUCCINATE ER 25 MG PO TB24: 25.0000 mg | ORAL_TABLET | Freq: Two times a day (BID) | ORAL | Status: DC

## 2023-07-11 MED ORDER — METOPROLOL SUCCINATE ER 50 MG PO TB24
50.0000 mg | ORAL_TABLET | Freq: Every day | ORAL | Status: DC
Start: 2023-07-11 — End: 2023-08-29

## 2023-07-14 ENCOUNTER — Telehealth: Payer: Self-pay | Admitting: Cardiology

## 2023-07-14 NOTE — Telephone Encounter (Signed)
 Patient stated he is returning a call regarding his heart monitor results.

## 2023-07-15 NOTE — Telephone Encounter (Signed)
 Spoke with pt and went over heart monitor results. Explained the new medication changes and the pt verbalized understanding. Pt denied any further questions at this time.

## 2023-07-30 LAB — LAB REPORT - SCANNED
A1c: 6.2
TSH: 3.04 (ref 0.41–5.90)

## 2023-08-14 IMAGING — CR DG CHEST 2V
2 series · 2 of 2 positions shown · non-contrast
Comparison: 03/09/2021

CLINICAL DATA: Status post CABG and aortic valve replacement on
03/01/2021.

EXAM:
CHEST - 2 VIEW

[w chest pa]
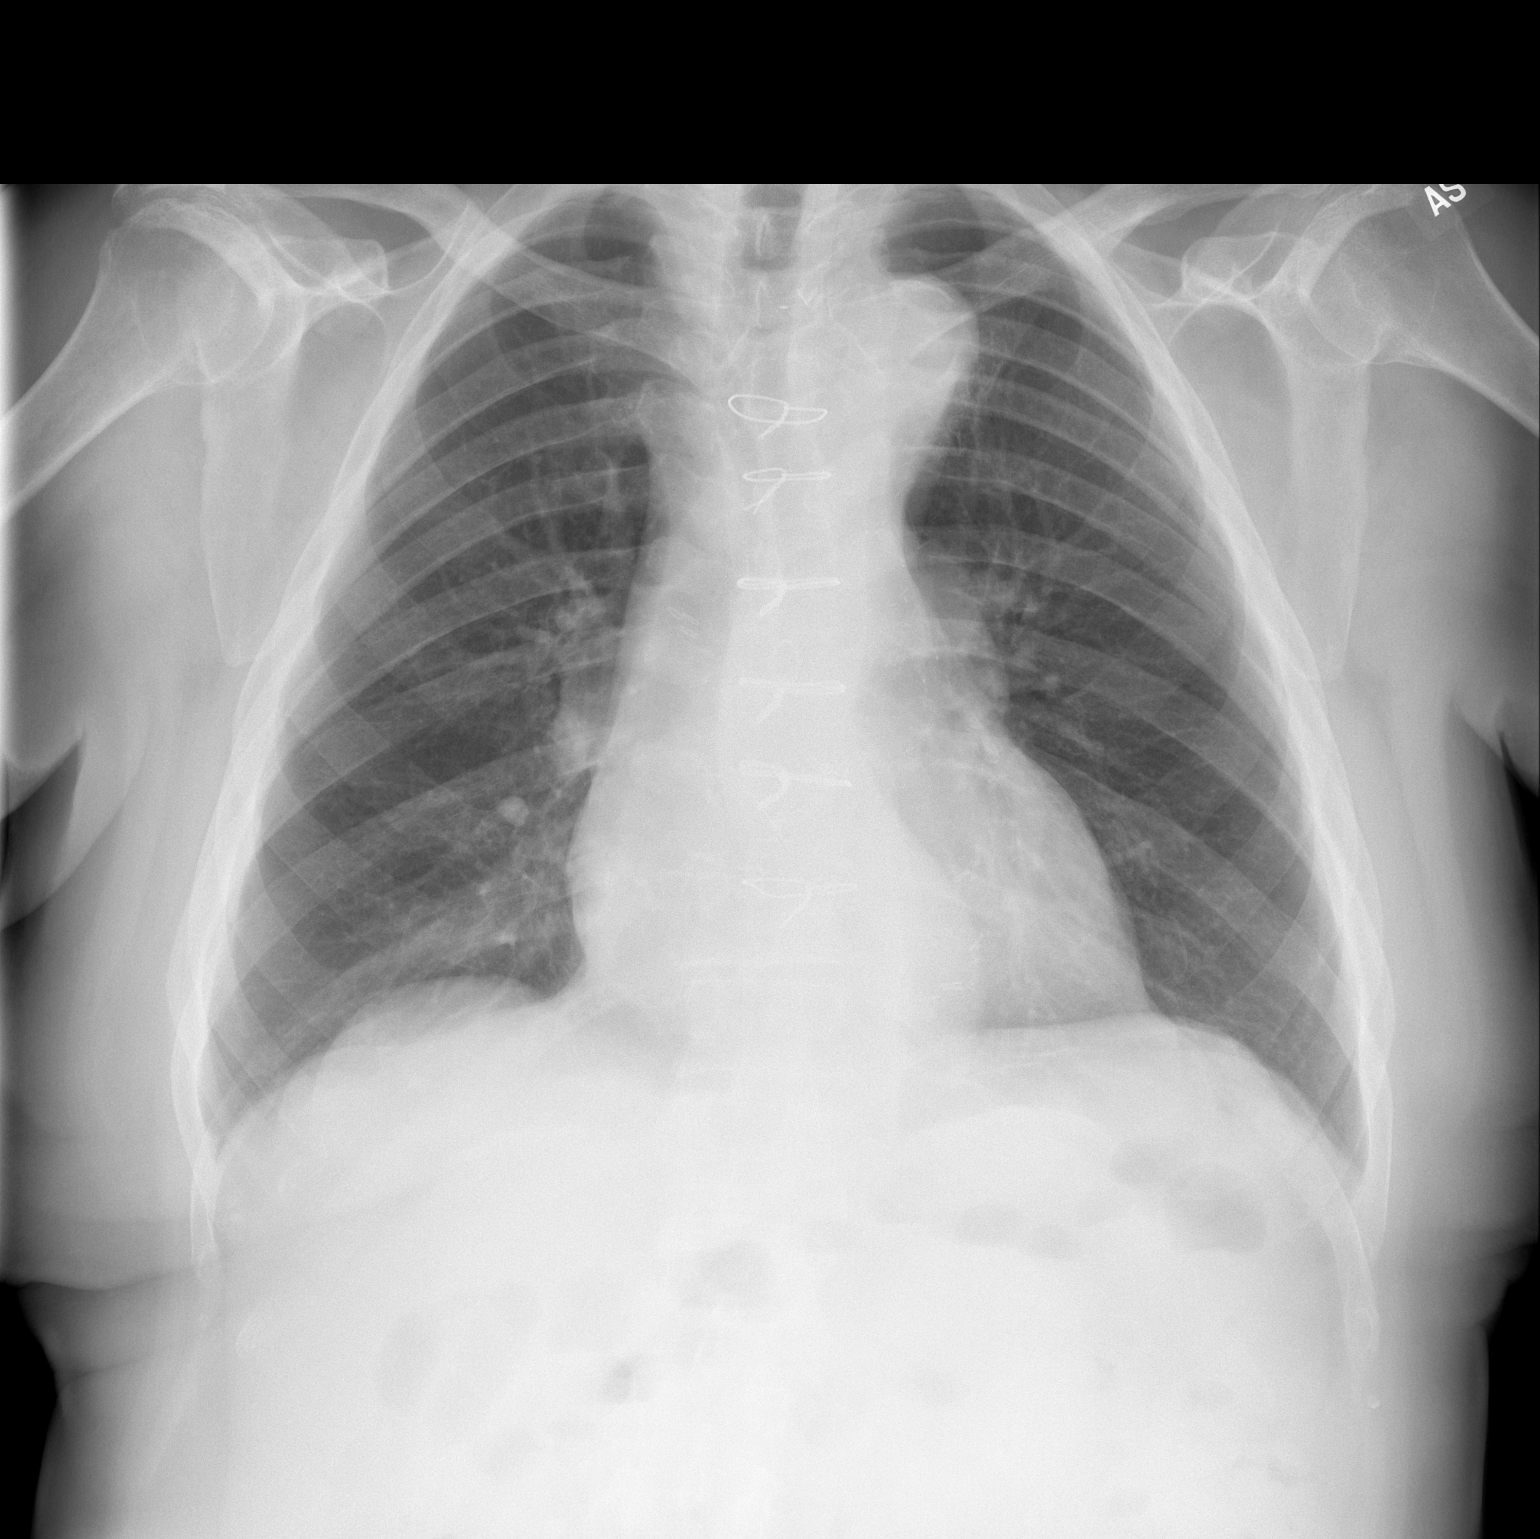

[w chest lat]
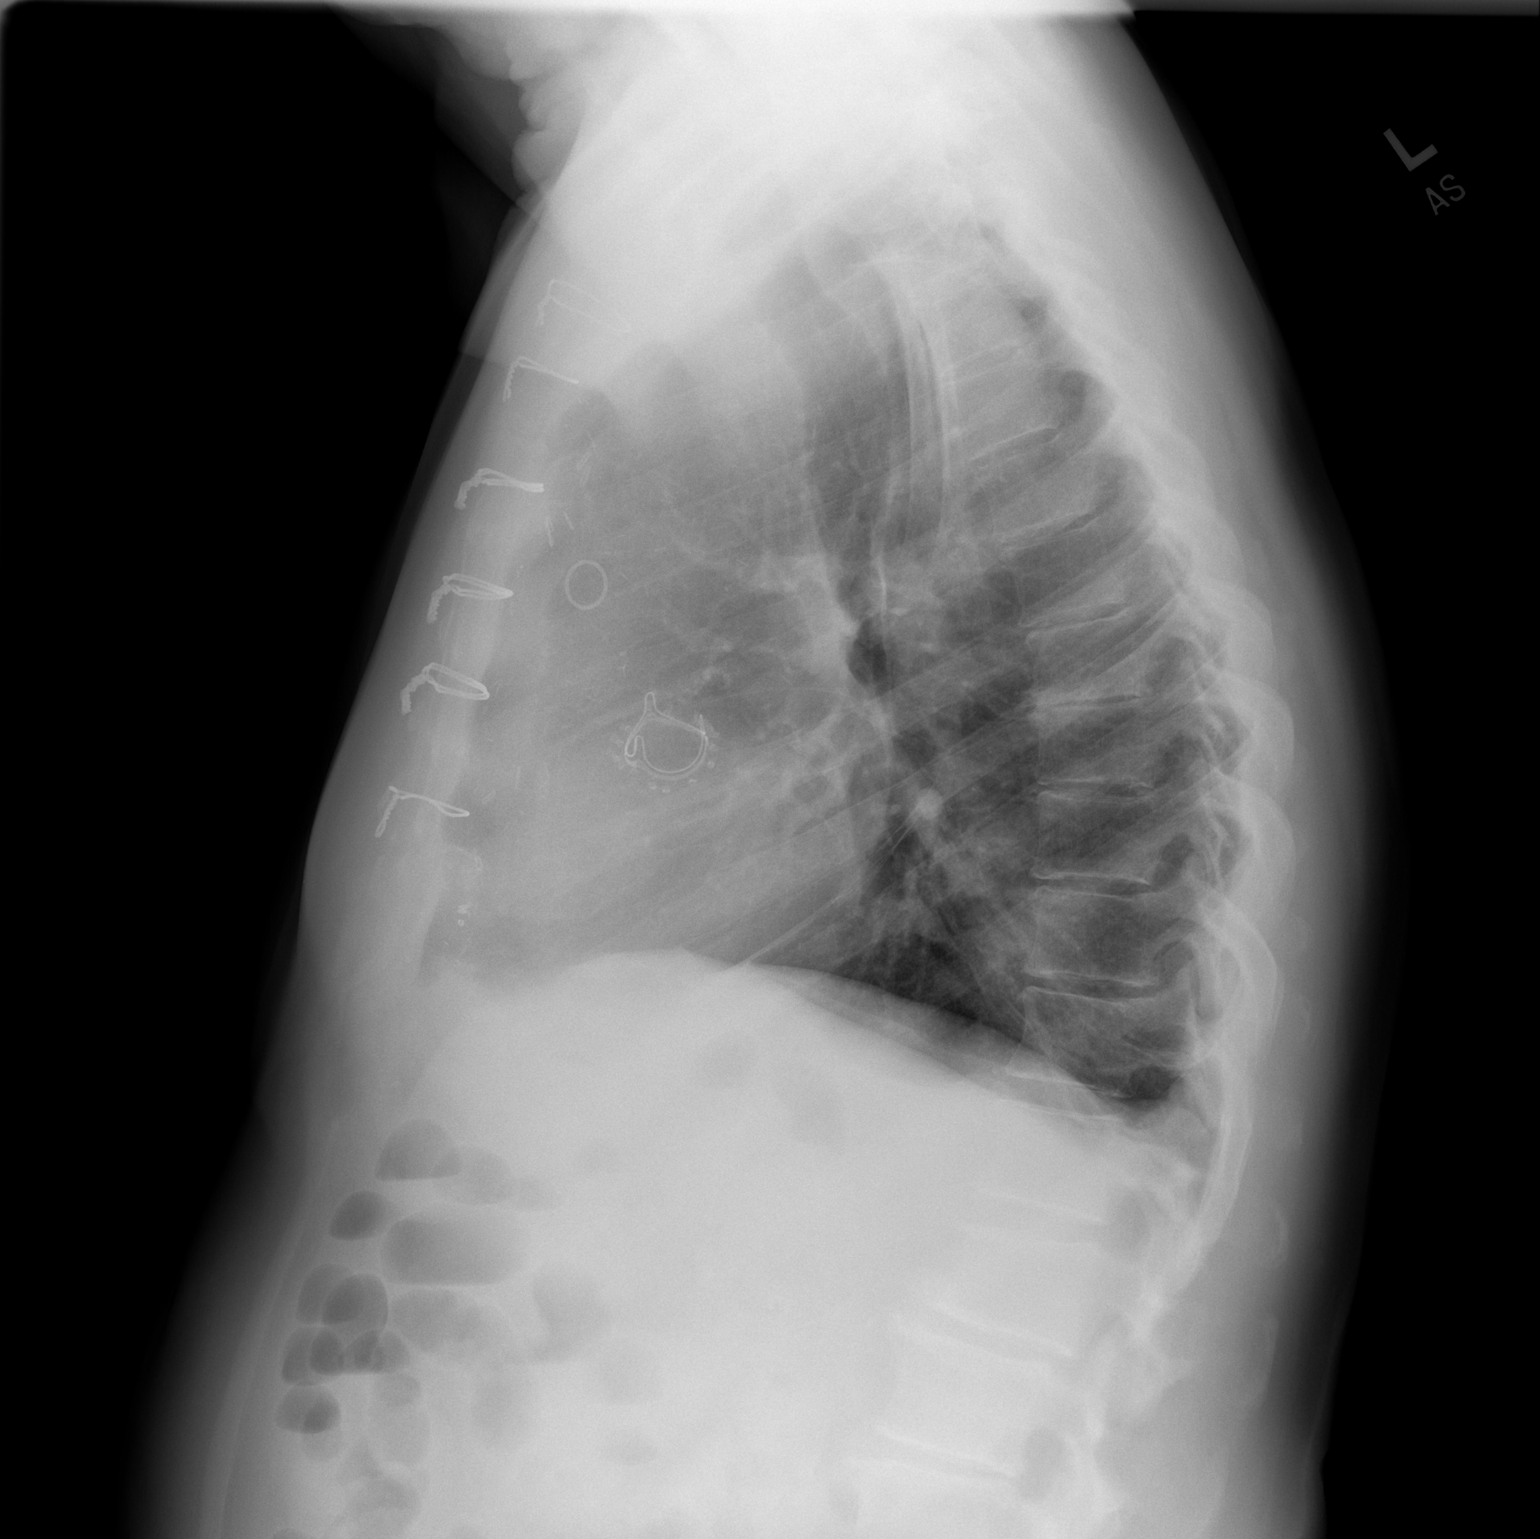

[2 of 2 positions shown; findings below may reference images not displayed]

FINDINGS: Normal sized heart. Post CABG changes and prosthetic aortic valve
without significant change. Tortuous and mildly calcified thoracic
aorta. Clear lungs with resolution of previously demonstrated
bilateral pleural fluid and bibasilar atelectasis. Unremarkable
bones.
IMPRESSION: No acute abnormality.

## 2023-08-15 ENCOUNTER — Ambulatory Visit: Payer: Self-pay | Admitting: Cardiology

## 2023-08-18 ENCOUNTER — Ambulatory Visit: Payer: Self-pay | Admitting: Cardiology

## 2023-08-29 ENCOUNTER — Ambulatory Visit: Attending: Cardiology | Admitting: Cardiology

## 2023-08-29 ENCOUNTER — Encounter: Payer: Self-pay | Admitting: Cardiology

## 2023-08-29 VITALS — BP 115/74 | HR 79 | Resp 16 | Ht 66.0 in | Wt 158.4 lb

## 2023-08-29 DIAGNOSIS — Z952 Presence of prosthetic heart valve: Secondary | ICD-10-CM | POA: Diagnosis not present

## 2023-08-29 DIAGNOSIS — I480 Paroxysmal atrial fibrillation: Secondary | ICD-10-CM

## 2023-08-29 DIAGNOSIS — I493 Ventricular premature depolarization: Secondary | ICD-10-CM

## 2023-08-29 DIAGNOSIS — Z7901 Long term (current) use of anticoagulants: Secondary | ICD-10-CM

## 2023-08-29 DIAGNOSIS — Z951 Presence of aortocoronary bypass graft: Secondary | ICD-10-CM

## 2023-08-29 DIAGNOSIS — I10 Essential (primary) hypertension: Secondary | ICD-10-CM

## 2023-08-29 DIAGNOSIS — I5032 Chronic diastolic (congestive) heart failure: Secondary | ICD-10-CM

## 2023-08-29 DIAGNOSIS — Z87891 Personal history of nicotine dependence: Secondary | ICD-10-CM

## 2023-08-29 DIAGNOSIS — E782 Mixed hyperlipidemia: Secondary | ICD-10-CM

## 2023-08-29 MED ORDER — RIVAROXABAN 15 MG PO TABS
15.0000 mg | ORAL_TABLET | Freq: Every day | ORAL | 3 refills | Status: AC
Start: 2023-08-29 — End: ?

## 2023-08-29 MED ORDER — METOPROLOL SUCCINATE ER 50 MG PO TB24: 50.0000 mg | ORAL_TABLET | Freq: Every day | ORAL | 3 refills | Status: DC

## 2023-08-29 NOTE — Progress Notes (Signed)
 Cardiology Office Note:  .   ID:  Keith Wagner, DOB Oct 22, 1940, MRN [id number] PCP:  Lory Rough PA-C  Former Cardiology Providers: Zena High, APRN, FNP-C  [facility] Providers Cardiologist:  Olinda Bertrand, DO , [facility] (established care 06/10/2019) Electrophysiologist:  None  Click to update primary MD,subspecialty MD or APP then REFRESH:1}    Chief Complaint  Patient presents with   Chronic heart failure with preserved ejection fraction   Follow-up    History of Present Illness: .   Keith Wagner is a 83 y.o.  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  Patient being followed in the practice given his history of CAD status post surgical revascularization, status post bioprosthetic AVR 21 mm Edward InspirEase Resilia, and heart failure.  In September 2022 and underwent an echocardiogram which noted reduced LVEF along with grade 2 diastolic impairment and elevated left atrial pressure. 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 On March 01, 2021 he underwent two-vessel CABG (LIMA to the LAD and SVG to OM) and a bioprosthetic aortic valve with 21 mm Edwards Inspiris Resilia valve. 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  Since his bypass and aortic valve replacement he has done well clinically.  Patient follows up today for 78-month follow-up.  Unfortunately, since last office visit patient his wife passed away due to small bowel obstruction and its  complications.  Condolences provided to the patient and his niece who accompanies him at today's office visit.  Patient provides Cindy Creed (niece) verbal consent with having him present during today's encounter.  Since last office visit patient has lost approximately 18 pounds likely secondary to poor oral intake and grieving.  He had an echocardiogram since last office visit with notes preserved LVEF and aortic valve is functioning appropriately.  He also had a cardiac monitor to evaluate for PVC burden.  His average heart rate was 72 bpm, and a total PVC burden of 6.9%.  He was advised to go up on Toprol -XL from 25 mg p.o. daily to 50 mg p.o. daily.  However he misunderstood and went up on spironolactone .  Home blood pressures are well-controlled.  Patient does not endorse evidence of bleeding.  Review of Systems: .   Review of Systems  Constitutional: Positive for weight loss.  Cardiovascular:  Negative for chest pain, claudication, dyspnea on exertion, irregular heartbeat, leg swelling, near-syncope, orthopnea, palpitations, paroxysmal nocturnal dyspnea and syncope.  Respiratory:  Negative for shortness of breath.   Hematologic/Lymphatic: Negative for bleeding problem.  Musculoskeletal:  Negative for muscle cramps and myalgias.  Neurological:  Negative for dizziness and light-headedness.    Studies Reviewed:   CABG x2 on 03/01/2021: LIMA to LAD and SVG to OM and aortic valve replacement with a 21 mm Edwards InspirEase Resilia bioprosthetic valve.   Temporary Pacer Placement 03/03/2021.  Echocardiogram: 05/25/2019: LVEF 50-55%, grade 1 diastolic impairment   12/07/2020: LVEF 30-35%, moderate LVH, global HK, grade 2 DD, elevated LAP, mild LAE, moderate AAS, moderate AR, moderate MR, mild TR, mild PHTN.   03/03/2021:  1. Left ventricular ejection fraction,  by estimation, is 50 to 55%. Left ventricular ejection fraction by PLAX is 45 %. The left ventricle has low normal function. The left  ventricle has no regional wall motion normalities. There is mild left ventricular hypertrophy. Left ventricular diastolic parameters are consistent with Grade II diastolic dysfunction (pseudonormalization). Elevated left ventricular end-diastolic pressure.   2. Right ventricular systolic function is normal. The right ventricular size is normal.   3. Left atrial size was moderately dilated.   4. The mitral valve was not well visualized. Trivial mitral valve regurgitation.   5. The aortic valve was not well visualized. Aortic valve regurgitation is not visualized. Mild aortic valve stenosis. There is a 21 mm Edwards InspirEase Resilia valve present in the aortic position. Aortic valve area, by VTI measures 0.98 cm.  Aortic valve mean gradient measures 17.5 mmHg. Aortic valve Vmax measures 2.77 m/s.   6. The inferior vena cava is dilated in size with <50% respiratory variability, suggesting right atrial pressure of 15 mmHg.   05/2023  1. Left ventricular ejection fraction, by estimation, is 60 to 65%. The  left ventricle has normal function. The left ventricle has no regional  wall motion abnormalities. Left ventricular diastolic parameters were  normal.   2. Right ventricular systolic function is mildly reduced. The right  ventricular size is mildly enlarged. There is normal pulmonary artery  systolic pressure. The estimated right ventricular systolic pressure is  28.0 mmHg.   3. The mitral valve is normal in structure. Mild mitral valve  regurgitation. No evidence of mitral stenosis.   4. The aortic valve has been repaired/replaced. Aortic valve  regurgitation is not visualized. No aortic stenosis is present. There is a  21 mm Edwards INSPIRIS RESILIA valve present in the aortic position.  Procedure Date: 03/01/2021. Aortic valve area, by   VTI measures 1.63 cm. Aortic valve mean gradient measures 12.5 mmHg.  Aortic valve Vmax measures 2.38 m/s.   5. Aortic dilatation noted. There is mild  dilatation of the ascending  aorta, measuring 39 mm.   6. The inferior vena cava is normal in size with greater than 50%  respiratory variability, suggesting right atrial pressure of 3 mmHg.   Heart Catheterization: LHC / RHC / Thoracic Aortogram 02/06/2021: LM: Distal calcific 75% stenosis LAD: Mid 80% stenosis LCX: large vessel. Ostial 70% stenosis.  RCA: Dominant, but small vessel. No significant disease seen on well opacified non-selective angiogram.   Normal PCW Unable to cross aortic valve due to severe aorta tortuosity.   CVTS consult for CABG +/- AVR   Carotid artery duplex 12/04/2017: Minimal stenosis in the right internal carotid artery (1-15%). Minimal stenosis in the left internal carotid artery (1-15%). Antegrade right vertebral artery flow. Antegrade left vertebral artery flow. Follow up when clinically indicated.  RADIOLOGY: NA  Risk Assessment/Calculations:   Click Here to Calculate/Change CHADS2VASc Score The patient's CHADS2-VASc score is 5, indicating a 7.2% annual risk of stroke.    Labs:       Latest Ref Rng & Units 05/29/2023    9:14 AM 08/23/2022   12:29 PM 08/16/2022   11:24 AM  CBC  WBC 4.0 - 10.5 K/uL  9.5    Hemoglobin 13.0 - 17.7 g/dL 40.9  81.1  91.4   Hematocrit 37.5 - 51.0 % 48.4  47.5  46.0   Platelets 150 - 400 K/uL  223         Latest Ref Rng & Units 05/29/2023    9:11 AM 10/25/2022  9:47 AM 09/03/2022    9:30 AM  BMP  Glucose 70 - 99 mg/dL 161  94  90   BUN 8 - 27 mg/dL 17  23  23    Creatinine 0.76 - 1.27 mg/dL 0.96  0.45  4.09   BUN/Creat Ratio 10 - 24 13  19  15    Sodium 134 - 144 mmol/L 139  139  138   Potassium 3.5 - 5.2 mmol/L 3.9  4.8  4.4   Chloride 96 - 106 mmol/L 104  104  104   CO2 20 - 29 mmol/L 21  20  19    Calcium  8.6 - 10.2 mg/dL 9.1  9.3  8.4       Latest Ref Rng & Units 05/29/2023    9:11 AM 10/25/2022    9:47 AM 09/03/2022    9:30 AM  CMP  Glucose 70 - 99 mg/dL 811  94  90   BUN 8 - 27 mg/dL 17  23  23     Creatinine 0.76 - 1.27 mg/dL 9.14  7.82  9.56   Sodium 134 - 144 mmol/L 139  139  138   Potassium 3.5 - 5.2 mmol/L 3.9  4.8  4.4   Chloride 96 - 106 mmol/L 104  104  104   CO2 20 - 29 mmol/L 21  20  19    Calcium  8.6 - 10.2 mg/dL 9.1  9.3  8.4   Total Protein 6.0 - 8.5 g/dL 5.9     Total Bilirubin 0.0 - 1.2 mg/dL 1.0     Alkaline Phos 44 - 121 IU/L 75     AST 0 - 40 IU/L 25     ALT 0 - 44 IU/L 19       No results found for: "CHOL", "HDL", "LDLCALC", "LDLDIRECT", "TRIG", "CHOLHDL" No results for input(s): "LIPOA" in the last 8760 hours. No components found for: "NTPROBNP" Recent Labs    09/03/22 0930 10/25/22 0947  PROBNP 305 320   Recent Labs    05/29/23 0914 07/30/23 0000  TSH 2.790 3.04   External Labs: Collected: Jul 30, 2023 provided by PCP. AST, ALT, alkaline phosphatase within normal limits. TSH 3.0 Total cholesterol 110, triglycerides 181, HDL 36, LDL calculated 48, non-HDL 74. A1c 6.2. Hemoglobin 15.9  Physical Exam:    Today's Vitals   08/29/23 0930  BP: 115/74  Pulse: 79  Resp: 16  SpO2: 95%  Weight: 158 lb 6.4 oz (71.8 kg)  Height: 5\' 6"  (1.676 m)   Body mass index is 25.57 kg/m. Wt Readings from Last 3 Encounters:  08/29/23 158 lb 6.4 oz (71.8 kg)  05/29/23 176 lb (79.8 kg)  08/27/22 177 lb 3.2 oz (80.4 kg)    Physical Exam  Constitutional: No distress.  hemodynamically stable  Neck: No JVD present.  Cardiovascular: Normal rate, regular rhythm, S1 normal and S2 normal. Exam reveals no gallop, no S3 and no S4.  Murmur heard. Midsystolic murmur is present with a grade of 2/6 at the upper right sternal border radiating to the neck. Pulses:      Carotid pulses are 2+ on the right side and 2+ on the left side. Pulmonary/Chest: Effort normal and breath sounds normal. No stridor. He has no wheezes. He has no rales.  Musculoskeletal:        General: No edema.     Cervical back: Neck supple.  Skin: Skin is warm.   Impression & Recommendation(s):   Impression:   ICD-10-CM   1. Heart  failure with improved ejection fraction (HFimpEF) (HCC)  I50.32     2. S/P CABG x 2  Z95.1     3. S/P AVR  Z95.2     4. Paroxysmal atrial fibrillation (HCC)  I48.0     5. Long term (current) use of anticoagulants  Z79.01     6. Benign essential hypertension  I10     7. PVC (premature ventricular contraction)  I49.3     8. Mixed hyperlipidemia  E78.2     9. Former smoker  Z87.891         Recommendation(s):  Heart failure with improved EF  S/P CABG x 2 Stage C, NYHA class I/II December 07, 2020: LVEF 30-35%. December 2022: LVEF 50-55% March 2025: LVEF 60-65% Has lost approximately 18 pounds since last office visit likely secondary to grieving as he lost his wife in March 2025 and poor oral intake. Currently on appropriate GDMT. Systolic blood pressures at home are well-controlled Continue Farxiga  10 mg p.o. daily. Continue Lasix  20 mg p.o. daily and as needed basis. Continue Entresto  49/51 mg p.o. twice daily. Will decrease spironolactone  from 50 mg p.o. daily to 25 mg p.o. daily. Increase Toprol -XL from 25 mg p.o. daily to 50 mg p.o. daily with holding parameters Currently on spironolactone  12.5 mg p.o. daily, recommend holding it for now to prevent hypotension. Will check CMP and NT proBNP prior to next office visit  S/P AVR Fingerprint echo March 03, 2021. Reviewed the results of the echocardiogram from March 2025.   Patient is aware of antibiotic prophylaxis prior to procedures.  PVC (premature ventricular contraction) New PVC burden noted to be close to 6%  Increase Toprol -XL to 50 mg p.o. daily as mentioned above.   Cardiac monitor reviewed with the patient and niece at today's office visit.    Paroxysmal atrial fibrillation (HCC) Long term (current) use of anticoagulants Rate control: Metoprolol . Rhythm control: N/A, history of amiodarone  use Thromboembolic prophylaxis: Xarelto . Does not endorse evidence of  bleeding. Will check H&H prior to the next office visit Outside labs from May 2025 independently reviewed with the patient, provided by primary team  Benign essential hypertension Follow-up visit Home blood pressures are better controlled. Medications as discussed above.  Orders Placed:  No orders of the defined types were placed in this encounter.   Final Medication List:   No orders of the defined types were placed in this encounter.   Medications Discontinued During This Encounter  Medication Reason   metoprolol  succinate (TOPROL -XL) 50 MG 24 hr tablet Dose change   metoprolol  succinate (TOPROL -XL) 25 MG 24 hr tablet Dose change     Current Outpatient Medications:    acetaminophen  (TYLENOL ) 500 MG tablet, Take 1,000 mg by mouth every 6 (six) hours as needed for moderate pain., Disp: , Rfl:    aspirin  81 MG tablet, Take 81 mg by mouth daily., Disp: , Rfl:    atorvastatin  (LIPITOR) 40 MG tablet, Take 1 tablet (40 mg total) by mouth daily., Disp: 30 tablet, Rfl: 3   calcium  carbonate (TUMS - DOSED IN MG ELEMENTAL CALCIUM ) 500 MG chewable tablet, Chew 500-1,000 mg by mouth daily as needed for indigestion or heartburn., Disp: , Rfl:    clobetasol cream (TEMOVATE) 0.05 %, Apply 1 application topically 2 (two) times daily as needed (eczema)., Disp: , Rfl:    dapagliflozin  propanediol (FARXIGA ) 10 MG TABS tablet, Take 1 tablet (10 mg total) by mouth daily before breakfast., Disp: 90 tablet, Rfl: 2   ezetimibe  (  ZETIA ) 10 MG tablet, Take 1 tablet by mouth once daily, Disp: 90 tablet, Rfl: 2   furosemide  (LASIX ) 20 MG tablet, TAKE 1 TABLET BY MOUTH ONCE DAILY AS NEEDED FOR WEIGHT GAIN OF 1 POUND OR MORE IN 24 HOURS OR 5 POUNDS OR MORE IN 1 WEEK, Disp: 30 tablet, Rfl: 2   levothyroxine (SYNTHROID) 25 MCG tablet, Take 25 mcg by mouth daily., Disp: , Rfl:    Lutein 6 MG TABS, Take 6 mg by mouth daily., Disp: , Rfl:    MAGnesium -Oxide 400 (240 Mg) MG tablet, Take 1 tablet by mouth once daily,  Disp: 90 tablet, Rfl: 2   Multiple Vitamins-Minerals (MULTIVITAMIN PO), Take 1 tablet by mouth daily., Disp: , Rfl:    Omega-3 Fatty Acids (FISH OIL) 1000 MG CAPS, Take 1,000 mg by mouth daily., Disp: , Rfl:    OVER THE COUNTER MEDICATION, Take 1 tablet by mouth daily. Super Beta Prostate Advanced, Disp: , Rfl:    Rivaroxaban  (XARELTO ) 15 MG TABS tablet, TAKE 1 TABLET BY MOUTH ONCE DAILY WITH SUPPER, Disp: 90 tablet, Rfl: 1   sacubitril -valsartan  (ENTRESTO ) 49-51 MG, Take 1 tablet by mouth 2 (two) times daily., Disp: 180 tablet, Rfl: 3   spironolactone  (ALDACTONE ) 25 MG tablet, Take 25 mg by mouth once., Disp: , Rfl:   Consent:   NA  Disposition:   6 months sooner if needed.   His questions and concerns were addressed to his satisfaction. He voices understanding of the recommendations provided during this encounter.    Signed, Awilda Bogus, [facility]  A Division of [facility] [address] [place], [place] [address]  [place], [place] [address]  10:38 AM 08/29/23

## 2023-08-29 NOTE — Patient Instructions (Signed)
 Medication Instructions:  Increase Metoprolol  Succinate to 50 mg once daily in the morning.  Hold if systolic BP (top number) is less than 100 and/or heart rate is less than 55  Refill for Xarelto  sent to pharmacy   *If you need a refill on your cardiac medications before your next appointment, please call your pharmacy*  Lab Work: CMP, H&H, BNP in 6 months  If you have labs (blood work) drawn today and your tests are completely normal, you will receive your results only by: MyChart Message (if you have MyChart) OR A paper copy in the mail If you have any lab test that is abnormal or we need to change your treatment, we will call you to review the results.  Testing/Procedures: None   Follow-Up: At [facility], you and your health needs are our priority.  As part of our continuing mission to provide you with exceptional heart care, our providers are all part of one team.  This team includes your primary Cardiologist (physician) and Advanced Practice Providers or APPs (Physician Assistants and Nurse Practitioners) who all work together to provide you with the care you need, when you need it.  Your next appointment:   6 month(s)  Provider:   Olinda Bertrand, DO

## 2023-09-08 ENCOUNTER — Encounter: Payer: Self-pay | Admitting: Cardiology

## 2023-09-08 DIAGNOSIS — I10 Essential (primary) hypertension: Secondary | ICD-10-CM

## 2023-09-08 MED ORDER — METOPROLOL SUCCINATE ER 50 MG PO TB24: 50.0000 mg | ORAL_TABLET | Freq: Every day | ORAL | 3 refills | Status: AC

## 2023-10-01 ENCOUNTER — Other Ambulatory Visit: Payer: Self-pay | Admitting: Cardiology

## 2023-10-01 DIAGNOSIS — I5041 Acute combined systolic (congestive) and diastolic (congestive) heart failure: Secondary | ICD-10-CM

## 2023-11-05 ENCOUNTER — Other Ambulatory Visit: Payer: Self-pay | Admitting: Cardiology

## 2023-11-05 DIAGNOSIS — I2510 Atherosclerotic heart disease of native coronary artery without angina pectoris: Secondary | ICD-10-CM

## 2023-11-05 DIAGNOSIS — E782 Mixed hyperlipidemia: Secondary | ICD-10-CM

## 2023-11-05 DIAGNOSIS — Z951 Presence of aortocoronary bypass graft: Secondary | ICD-10-CM

## 2023-11-11 ENCOUNTER — Encounter: Payer: Self-pay | Admitting: Cardiology

## 2023-11-19 ENCOUNTER — Other Ambulatory Visit: Payer: Self-pay | Admitting: Cardiology

## 2023-11-20 ENCOUNTER — Encounter: Payer: Self-pay | Admitting: Cardiology

## 2023-12-20 ENCOUNTER — Encounter: Payer: Self-pay | Admitting: Cardiology

## 2024-02-23 LAB — HEMOGLOBIN AND HEMATOCRIT, BLOOD
Hematocrit: 49.8 % (ref 37.5–51.0)
Hemoglobin: 16.5 g/dL (ref 13.0–17.7)

## 2024-02-24 ENCOUNTER — Telehealth: Payer: Self-pay | Admitting: Student

## 2024-02-24 ENCOUNTER — Other Ambulatory Visit: Payer: Self-pay | Admitting: Student

## 2024-02-24 ENCOUNTER — Ambulatory Visit: Payer: Self-pay | Admitting: Cardiology

## 2024-02-24 DIAGNOSIS — E876 Hypokalemia: Secondary | ICD-10-CM

## 2024-02-24 LAB — COMPREHENSIVE METABOLIC PANEL WITH GFR
ALT: 25 IU/L (ref 0–44)
AST: 37 IU/L (ref 0–40)
Albumin: 4.1 g/dL (ref 3.7–4.7)
Alkaline Phosphatase: 81 IU/L (ref 48–129)
BUN/Creatinine Ratio: 15 (ref 10–24)
BUN: 18 mg/dL (ref 8–27)
Bilirubin Total: 1.3 mg/dL — ABNORMAL HIGH (ref 0.0–1.2)
CO2: 29 mmol/L (ref 20–29)
Calcium: 9.1 mg/dL (ref 8.6–10.2)
Chloride: 94 mmol/L — ABNORMAL LOW (ref 96–106)
Creatinine, Ser: 1.23 mg/dL (ref 0.76–1.27)
Globulin, Total: 2.1 g/dL (ref 1.5–4.5)
Glucose: 123 mg/dL — ABNORMAL HIGH (ref 70–99)
Potassium: 2.5 mmol/L — CL (ref 3.5–5.2)
Sodium: 140 mmol/L (ref 134–144)
Total Protein: 6.2 g/dL (ref 6.0–8.5)
eGFR: 59 mL/min/1.73 — ABNORMAL LOW (ref >59)

## 2024-02-24 LAB — PRO B NATRIURETIC PEPTIDE: NT-Pro BNP: 473 pg/mL (ref 0–486)

## 2024-02-24 MED ORDER — POTASSIUM CHLORIDE CRYS ER 20 MEQ PO TBCR: 40.0000 meq | EXTENDED_RELEASE_TABLET | Freq: Two times a day (BID) | ORAL | 0 refills | Status: DC

## 2024-02-24 MED ORDER — POTASSIUM CHLORIDE CRYS ER 20 MEQ PO TBCR: 20.0000 meq | EXTENDED_RELEASE_TABLET | Freq: Every day | ORAL | 3 refills | Status: AC

## 2024-02-24 MED ORDER — POTASSIUM CHLORIDE CRYS ER 20 MEQ PO TBCR: 20.0000 meq | EXTENDED_RELEASE_TABLET | Freq: Every day | ORAL | 1 refills | Status: DC

## 2024-02-24 NOTE — Progress Notes (Signed)
 Oral potassium for hypokalemia of 2.5.

## 2024-02-24 NOTE — Telephone Encounter (Signed)
 I was informed by Grayce the overnight fellow that he had received a page from [facility] about a critical lab result.  Fellow reported that he had not called [facility] back and asked me to do it.  I contacted [facility] and they inform me that they were contacting us  about his critical potassium of 2.5.  I reviewed the patient's labs and his creatinine was 1.23.  I contacted the patient and recommended that he go to the emergency department and stressed the importance of the situation to the patient.  The patient informed me that he did not want to go to the emergency department and that he wanted to get his potassium rechecked at his PCP follow-up tomorrow.  The patient denied any symptoms such as weakness, fatigue, lightheadedness, dizziness.  I recommended that the patient consider going to the emergency department especially if he starts to experience symptoms.  I recommended that the patient hold his Lasix  until his potassium is rechecked.  I recommended that the patient eat foods that are high in potassium such as bananas.  I will send in a prescription for oral potassium (40meq twice daily for 2 days) to the patient's pharmacy.  Patient has a follow-up appointment with Dr. Michele on 03/02/2024.  I recommend considering a daily potassium supplement at that appointment.  I will forward this to Dr. Michele so he is aware.  SignedMorse Clause, PA-C 02/24/2024, 6:49 AM

## 2024-02-24 NOTE — Telephone Encounter (Signed)
 Called spoke to patient . per Dr Tyree verbal order.  Patient stated he did receive a message earlier today about his low potassium level.  RN  informed patient that Dr Michele agreed with recommendation for patient to go to th ER   for evaluation  of potassium level.  Patient  stated that he will have lab work rechecked at primary tomorrow.  RN again stated to patient Dr Michele would like for him to go to ER.  RN asked patient if he was taking  potassium tablet on a daily basis. Patient stated no  and has not been taken for over a year.   RN  asked  patient if he was sure his primary was going to repeat labs. He said she will look at them and do it. RN   request patient to  speak to primary about the low potassium level. Patient voiced understanding to importance of repeating lab since he decline going to ER for further evaluation.

## 2024-02-24 NOTE — Telephone Encounter (Signed)
 FYI   Dr. Michele

## 2024-02-24 NOTE — Telephone Encounter (Signed)
 RN  inform

## 2024-02-25 NOTE — Telephone Encounter (Addendum)
 RN  called patient . Patient has app later today at primary.  Patient is aware to continue taking Potassium  one tablet a day after the 2 day load.  And to have labs repeated

## 2024-02-26 ENCOUNTER — Encounter: Payer: Self-pay | Admitting: Cardiology

## 2024-02-26 DIAGNOSIS — E876 Hypokalemia: Secondary | ICD-10-CM

## 2024-03-02 ENCOUNTER — Encounter: Payer: Self-pay | Admitting: Cardiology

## 2024-03-02 ENCOUNTER — Ambulatory Visit: Attending: Cardiology | Admitting: Cardiology

## 2024-03-02 VITALS — BP 102/66 | HR 55 | Resp 16 | Ht 66.0 in | Wt 170.6 lb

## 2024-03-02 DIAGNOSIS — I10 Essential (primary) hypertension: Secondary | ICD-10-CM

## 2024-03-02 DIAGNOSIS — Z952 Presence of prosthetic heart valve: Secondary | ICD-10-CM

## 2024-03-02 DIAGNOSIS — E876 Hypokalemia: Secondary | ICD-10-CM

## 2024-03-02 DIAGNOSIS — Z7901 Long term (current) use of anticoagulants: Secondary | ICD-10-CM

## 2024-03-02 DIAGNOSIS — I5020 Unspecified systolic (congestive) heart failure: Secondary | ICD-10-CM

## 2024-03-02 DIAGNOSIS — Z951 Presence of aortocoronary bypass graft: Secondary | ICD-10-CM

## 2024-03-02 DIAGNOSIS — I480 Paroxysmal atrial fibrillation: Secondary | ICD-10-CM

## 2024-03-02 DIAGNOSIS — I2510 Atherosclerotic heart disease of native coronary artery without angina pectoris: Secondary | ICD-10-CM

## 2024-03-02 NOTE — Patient Instructions (Addendum)
 Medication Instructions:  Continue same medications *If you need a refill on your cardiac medications before your next appointment, please call your pharmacy*  Lab Work: Cmet,magnesium ,Pro BNP today If you have labs (blood work) drawn today and your tests are completely normal, you will receive your results only by: MyChart Message (if you have MyChart) OR A paper copy in the mail If you have any lab test that is abnormal or we need to change your treatment, we will call you to review the results.  Testing/Procedures: None ordered  Follow-Up: At [facility], you and your health needs are our priority.  As part of our continuing mission to provide you with exceptional heart care, our providers are all part of one team.  This team includes your primary Cardiologist (physician) and Advanced Practice Providers or APPs (Physician Assistants and Nurse Practitioners) who all work together to provide you with the care you need, when you need it.  Your next appointment:  6 months    Call in April to schedule June appointment    Provider:  Dr.Tolia    We recommend signing up for the patient portal called MyChart.  Sign up information is provided on this After Visit Summary.  MyChart is used to connect with patients for Virtual Visits (Telemedicine).  Patients are able to view lab/test results, encounter notes, upcoming appointments, etc.  Non-urgent messages can be sent to your provider as well.   To learn more about what you can do with MyChart, go to [email or web address].

## 2024-03-02 NOTE — Progress Notes (Signed)
 Cardiology Office Note:  .   ID:  Keith Wagner, DOB 1940/06/23, MRN [id number] PCP:  Debrah Josette MOHR PA-C  Former Cardiology Providers: Emmalene Lawrence, APRN, FNP-C  [facility] Providers Cardiologist:  Madonna Large, DO , [facility] (established care 06/10/2019) Electrophysiologist:  None  Click to update primary MD,subspecialty MD or APP then REFRESH:1}    Chief Complaint  Patient presents with   Chronic heart failure with preserved ejection fraction    Follow-up    Low potassium levels, weight gain    History of Present Illness: .   Keith Wagner is a 83 y.o.  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  Patient being followed for CAD status post surgical revascularization, status post bioprosthetic AVR 21 mm Edward InspirEase Resilia, and heart failure.  In September 2022 and underwent an echocardiogram which noted reduced LVEF along with grade 2 diastolic impairment and elevated left atrial pressure. 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 On March 01, 2021 he underwent two-vessel CABG (LIMA to the LAD and SVG to OM) and a bioprosthetic aortic valve with 21 mm Edwards Inspiris Resilia valve. 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  Patient presents today for 34-month follow-up. Since last office visit he had blood work done which noted hypokalemia.  He was advised to go to the ER for repeat labs and for parenteral repletion.  However, patient refused and followed  up with PCP the following day.  Patient was placed on potassium supplementation orally initially 40 mEq twice daily for the first 2 doses followed by 20 mEq/day.  He has not had any repeat labs.  Given the hypokalemia he is currently held off taking Lasix  and spironolactone .    Patient is accompanied by his niece Keith Wagner who provides collateral history. His weight at home usually is around 160#.  On 02/25/2024 they started holding Lasix  which he was taking as needed and spironolactone  25 mg p.o. daily, weight around 166#.  Today's weight 171 pounds. Denies orthopnea or PND. Does have lower extremity swelling and weight gain.  Has been having diarrhea which is probably the cause of his potassium losses.  No anginal chest pain  Review of Systems: .   Review of Systems  Constitutional: Positive for weight gain.  Cardiovascular:  Negative for chest pain, claudication, dyspnea on exertion, irregular heartbeat, leg swelling, near-syncope, orthopnea, palpitations, paroxysmal nocturnal dyspnea and syncope.  Respiratory:  Negative for shortness of breath.   Hematologic/Lymphatic: Negative for bleeding problem.  Musculoskeletal:  Negative for muscle cramps and myalgias.  Neurological:  Negative for dizziness and light-headedness.    Studies Reviewed:   EKG Interpretation Date/Time:  Tuesday March 02 2024 08:55:53 EST Ventricular Rate:  56 PR Interval:  198 QRS Duration:  102 QT Interval:  462 QTC Calculation: 445 R Axis:   -37  Text Interpretation: Sinus bradycardia Left axis deviation When compared with ECG of 29-May-2023 07:56, Premature ventricular complexes are no longer Present QRS axis Shifted left Minimal criteria for Inferior infarct are no longer Present T wave inversion no longer evident in Inferior leads  Nonspecific T wave abnormality no longer evident in Anterior leads Confirmed by Michele Richardson [phone]) on 03/02/2024 9:00:11 AM  CABG x2 on 03/01/2021: LIMA to LAD and SVG to OM and  aortic valve replacement with a 21 mm Edwards InspirEase Resilia bioprosthetic valve.   Temporary Pacer Placement 03/03/2021.  Echocardiogram: 05/25/2019: LVEF 50-55%, grade 1 diastolic impairment   12/07/2020: LVEF 30-35%, moderate LVH, global HK, grade 2 DD, elevated LAP, mild LAE, moderate AAS, moderate AR, moderate MR, mild TR, mild PHTN.   03/03/2021:  1. Left ventricular ejection fraction, by estimation, is 50 to 55%. Left ventricular ejection fraction by PLAX is 45 %. The left ventricle has low normal function. The left ventricle has no regional wall motion normalities. There is mild left ventricular hypertrophy. Left ventricular diastolic parameters are consistent with Grade II diastolic dysfunction (pseudonormalization). Elevated left ventricular end-diastolic pressure.   2. Right ventricular systolic function is normal. The right ventricular size is normal.   3. Left atrial size was moderately dilated.   4. The mitral valve was not well visualized. Trivial mitral valve regurgitation.   5. The aortic valve was not well visualized. Aortic valve regurgitation is not visualized. Mild aortic valve stenosis. There is a 21 mm Edwards InspirEase Resilia valve present in the aortic position. Aortic valve area, by VTI measures 0.98 cm.  Aortic valve mean gradient measures 17.5 mmHg. Aortic valve Vmax measures 2.77 m/s.   6. The inferior vena cava is dilated in size with <50% respiratory variability, suggesting right atrial pressure of 15 mmHg.   05/2023  1. Left ventricular ejection fraction, by estimation, is 60 to 65%. The  left ventricle has normal function. The left ventricle has no regional  wall motion abnormalities. Left ventricular diastolic parameters were  normal.   2. Right ventricular systolic function is mildly reduced. The right  ventricular size is mildly enlarged. There is normal pulmonary artery  systolic pressure. The estimated right ventricular systolic pressure is  28.0  mmHg.   3. The mitral valve is normal in structure. Mild mitral valve  regurgitation. No evidence of mitral stenosis.   4. The aortic valve has been repaired/replaced. Aortic valve  regurgitation is not visualized. No aortic stenosis is present. There is a  21 mm Edwards INSPIRIS RESILIA valve present in the aortic position.  Procedure Date: 03/01/2021. Aortic valve area, by   VTI measures 1.63 cm. Aortic valve mean gradient measures 12.5 mmHg.  Aortic valve Vmax measures 2.38 m/s.   5. Aortic dilatation noted. There is mild dilatation of the ascending  aorta, measuring 39 mm.   6. The inferior vena cava is normal in size with greater than 50%  respiratory variability, suggesting right atrial pressure of 3 mmHg.   Heart Catheterization: LHC / RHC / Thoracic Aortogram 02/06/2021: LM: Distal calcific 75% stenosis LAD: Mid 80% stenosis LCX: large vessel. Ostial 70% stenosis.  RCA: Dominant, but small vessel. No significant disease seen on well opacified non-selective angiogram.   Normal PCW Unable to cross aortic valve due to severe aorta tortuosity.   CVTS consult for CABG +/- AVR   Carotid artery duplex 12/04/2017: RICA (1-15%). RICA (1-15%). Antegrade right vertebral artery flow. Antegrade left vertebral artery flow. Follow up when clinically indicated.  RADIOLOGY: NA  Risk Assessment/Calculations:   Click Here to Calculate/Change CHADS2VASc Score The patient's CHADS2-VASc score is 5, indicating a 7.2% annual risk of stroke.    Labs:       Latest Ref Rng & Units 02/23/2024  9:36 AM 05/29/2023    9:14 AM 08/23/2022   12:29 PM  CBC  WBC 4.0 - 10.5 K/uL   9.5   Hemoglobin 13.0 - 17.7 g/dL 83.4  83.7  84.7   Hematocrit 37.5 - 51.0 % 49.8  48.4  47.5   Platelets 150 - 400 K/uL   223        Latest Ref Rng & Units 02/23/2024    9:36 AM 05/29/2023    9:11 AM 10/25/2022    9:47 AM  BMP  Glucose 70 - 99 mg/dL 876  891  94   BUN 8 - 27 mg/dL 18  17  23    Creatinine 0.76 -  1.27 mg/dL 8.76  8.69  8.75   BUN/Creat Ratio 10 - 24 15  13  19    Sodium 134 - 144 mmol/L 140  139  139   Potassium 3.5 - 5.2 mmol/L 2.5  3.9  4.8   Chloride 96 - 106 mmol/L 94  104  104   CO2 20 - 29 mmol/L 29  21  20    Calcium  8.6 - 10.2 mg/dL 9.1  9.1  9.3       Latest Ref Rng & Units 02/23/2024    9:36 AM 05/29/2023    9:11 AM 10/25/2022    9:47 AM  CMP  Glucose 70 - 99 mg/dL 876  891  94   BUN 8 - 27 mg/dL 18  17  23    Creatinine 0.76 - 1.27 mg/dL 8.76  8.69  8.75   Sodium 134 - 144 mmol/L 140  139  139   Potassium 3.5 - 5.2 mmol/L 2.5  3.9  4.8   Chloride 96 - 106 mmol/L 94  104  104   CO2 20 - 29 mmol/L 29  21  20    Calcium  8.6 - 10.2 mg/dL 9.1  9.1  9.3   Total Protein 6.0 - 8.5 g/dL 6.2  5.9    Total Bilirubin 0.0 - 1.2 mg/dL 1.3  1.0    Alkaline Phos 48 - 129 IU/L 81  75    AST 0 - 40 IU/L 37  25    ALT 0 - 44 IU/L 25  19      No results found for: CHOL, HDL, LDLCALC, LDLDIRECT, TRIG, CHOLHDL No results for input(s): LIPOA in the last 8760 hours. No components found for: NTPROBNP Recent Labs    02/23/24 0932  PROBNP 473   Recent Labs    05/29/23 0914 07/30/23 0000  TSH 2.790 3.04   External Labs: Collected: Jul 30, 2023 provided by PCP. AST, ALT, alkaline phosphatase within normal limits. TSH 3.0 Total cholesterol 110, triglycerides 181, HDL 36, LDL calculated 48, non-HDL 74. A1c 6.2. Hemoglobin 15.9  Physical Exam:    Today's Vitals   03/02/24 0839  BP: 102/66  Pulse: (!) 55  Resp: 16  SpO2: 98%  Weight: 170 lb 9.6 oz (77.4 kg)  Height: 5' 6 (1.676 m)   Body mass index is 27.54 kg/m. Wt Readings from Last 3 Encounters:  03/02/24 170 lb 9.6 oz (77.4 kg)  08/29/23 158 lb 6.4 oz (71.8 kg)  05/29/23 176 lb (79.8 kg)    Physical Exam  Constitutional: No distress.  hemodynamically stable  Neck: No JVD present.  Cardiovascular: Regular rhythm, S1 normal and S2 normal. Bradycardia present. Exam reveals no gallop, no S3 and no S4.   Murmur heard. Midsystolic murmur is present with a grade of 2/6 at the upper  right sternal border radiating to the neck. Pulses:      Carotid pulses are 2+ on the right side and 2+ on the left side. Pulmonary/Chest: Effort normal and breath sounds normal. No stridor. He has no wheezes. He has no rales.  Musculoskeletal:        General: No edema.     Cervical back: Neck supple.  Skin: Skin is warm.   Impression & Recommendation(s):  Impression:   ICD-10-CM   1. Heart failure with improved ejection fraction (HFimpEF) (HCC)  I50.20 Basic metabolic panel with GFR    Magnesium     Pro b natriuretic peptide    2. S/P CABG x 2  Z95.1 Basic metabolic panel with GFR    Magnesium     Pro b natriuretic peptide    3. Coronary artery disease involving native coronary artery of native heart without angina pectoris  I25.10 EKG 12-Lead    Basic metabolic panel with GFR    Magnesium     Pro b natriuretic peptide    4. S/P AVR  Z95.2 Basic metabolic panel with GFR    Magnesium     Pro b natriuretic peptide    5. Paroxysmal atrial fibrillation (HCC)  I48.0 Basic metabolic panel with GFR    Magnesium     Pro b natriuretic peptide    Hemoglobin and hematocrit, blood    6. Long term (current) use of anticoagulants  Z79.01 Basic metabolic panel with GFR    Magnesium     Pro b natriuretic peptide    7. Benign essential hypertension  I10 Basic metabolic panel with GFR    Magnesium     Pro b natriuretic peptide    8. Hypokalemia  E87.6         Recommendation(s):  Heart failure with improved EF  S/P CABG x 2 Stage C, NYHA class II December 07, 2020: LVEF 30-35%. December 2022: LVEF 50-55% March 2025: LVEF 60-65% EKG today is nonischemic Currently on appropriate GDMT. Continue Farxiga  10 mg p.o. daily. Continue Toprol -XL 50 mg p.o. daily. Continue Entresto  49/51 mg p.o. twice daily. Given hypokalemia currently as placed Lasix  and spironolactone  on hold His niece also informs me that  patient back in August was noted to have worsening renal function when checked at PCPs office. Used to be on Lasix  20 mg p.o. as needed daily for weight gain more than 1 pound over 24 hours or 5 pounds over 1 week Used to be on spironolactone  25 mg p.o. daily Check NT proBNP, BMP, magnesium  level today Based on his potassium and renal function will need to review his potassium supplementation and diuretics.  Further recommendations to follow.  Patient provided verbal consent with regards to calling her niece  S/P AVR Fingerprint echo March 03, 2021. Reviewed the results of the echocardiogram from March 2025.   Patient is aware of antibiotic prophylaxis prior to procedures. Monitor for now  PVC (premature ventricular contraction) PVC burden noted to be close to 6%  Continue Toprol -XL to 50 mg p.o. daily   Paroxysmal atrial fibrillation (HCC) Long term (current) use of anticoagulants Rate control: Metoprolol . Rhythm control: N/A, history of amiodarone  use Thromboembolic prophylaxis: Xarelto . Does not endorse evidence of bleeding. Follow-up labs ordered.  Benign essential hypertension Office blood pressures are soft. Clinically asymptomatic. Medications as discussed above  Hypokalemia: Checking BMET and patient currently taking K-Lor 20 mEq p.o. daily  Orders Placed:  Orders Placed This Encounter  Procedures   Basic metabolic panel with GFR   Magnesium   Pro b natriuretic peptide   Hemoglobin and hematocrit, blood   EKG 12-Lead    Final Medication List:   No orders of the defined types were placed in this encounter.   Medications Discontinued During This Encounter  Medication Reason   potassium chloride  SA (KLOR-CON  M20) 20 MEQ tablet Duplicate   potassium chloride  SA (KLOR-CON  M) 20 MEQ tablet Duplicate     Current Outpatient Medications:    acetaminophen  (TYLENOL ) 500 MG tablet, Take 1,000 mg by mouth every 6 (six) hours as needed for moderate pain., Disp: , Rfl:     aspirin  81 MG tablet, Take 81 mg by mouth daily., Disp: , Rfl:    atorvastatin  (LIPITOR) 40 MG tablet, Take 1 tablet (40 mg total) by mouth daily., Disp: 30 tablet, Rfl: 3   calcium  carbonate (TUMS - DOSED IN MG ELEMENTAL CALCIUM ) 500 MG chewable tablet, Chew 500-1,000 mg by mouth daily as needed for indigestion or heartburn., Disp: , Rfl:    clobetasol cream (TEMOVATE) 0.05 %, Apply 1 application topically 2 (two) times daily as needed (eczema)., Disp: , Rfl:    dapagliflozin  propanediol (FARXIGA ) 10 MG TABS tablet, Take 1 tablet (10 mg total) by mouth daily before breakfast., Disp: 90 tablet, Rfl: 2   diphenoxylate-atropine (LOMOTIL) 2.5-0.025 MG tablet, Take 1 tablet by mouth 2 (two) times daily as needed., Disp: , Rfl:    ezetimibe  (ZETIA ) 10 MG tablet, Take 1 tablet by mouth once daily, Disp: 90 tablet, Rfl: 2   levothyroxine (SYNTHROID) 25 MCG tablet, Take 25 mcg by mouth daily., Disp: , Rfl:    Lutein 6 MG TABS, Take 6 mg by mouth daily., Disp: , Rfl:    MAGnesium -Oxide 400 (240 Mg) MG tablet, Take 1 tablet by mouth once daily, Disp: 90 tablet, Rfl: 3   metoprolol  succinate (TOPROL  XL) 50 MG 24 hr tablet, Take 1 tablet (50 mg total) by mouth daily. Hold if systolic BP (top number) is less than 100 and/or heart rate is less than 55. Take with or immediately following a meal., Disp: 90 tablet, Rfl: 3   Multiple Vitamins-Minerals (MULTIVITAMIN PO), Take 1 tablet by mouth daily., Disp: , Rfl:    Omega-3 Fatty Acids (FISH OIL) 1000 MG CAPS, Take 1,000 mg by mouth daily., Disp: , Rfl:    OVER THE COUNTER MEDICATION, Take 1 tablet by mouth daily. Super Beta Prostate Advanced, Disp: , Rfl:    potassium chloride  SA (KLOR-CON  M) 20 MEQ tablet, Take 1 tablet (20 mEq total) by mouth daily., Disp: 90 tablet, Rfl: 3   psyllium (REGULOID) 0.52 g capsule, Take 0.52 g by mouth daily., Disp: , Rfl:    Rivaroxaban  (XARELTO ) 15 MG TABS tablet, Take 1 tablet (15 mg total) by mouth daily with supper., Disp: 90  tablet, Rfl: 3   sacubitril -valsartan  (ENTRESTO ) 49-51 MG, Take 1 tablet by mouth 2 (two) times daily., Disp: 180 tablet, Rfl: 3   furosemide  (LASIX ) 20 MG tablet, TAKE 1 TABLET BY MOUTH ONCE DAILY AS NEEDED FOR  WEIGHT  GAIN  OF  1  POUND  OR  MORE  IN  24  HOURS,  OR  5  POUNDS  OR  MORE  IN  1  WEEK (Patient not taking: Reported on 03/02/2024), Disp: 30 tablet, Rfl: 2   spironolactone  (ALDACTONE ) 25 MG tablet, TAKE 1 TABLET BY MOUTH ONCE DAILY IN THE MORNING (Patient not taking: Reported on 03/02/2024), Disp: 90 tablet, Rfl: 3  Consent:   NA  Disposition:  6 months sooner if needed.   Discussed management of at least two chronic comorbid conditions and active problem list Hypokalemia and possible Acute/Chronic HFimpEF exacerbation. I have independently interpreted results of the EKG 03/02/2024, labs dated 02/23/2024 , and reviewed the results under studies reviewed as part of medical decision making.  Patient education provided regarding his cardiovascular care.  Since patient is on oral anticoagulation we have discuss the risk,benefit, and alternatives and either reviewed pertinent labs or ordered labs to monitor safety.  Prescription drug management including but not limited to: addition/titration/discontinuation of medical therapy, medication reconciliation, discussed potential side effects, follow up labs either reviewed or ordered for monitoring safety.  Labs ordered - see above.  I have independently formulated and discussed the assessment and plan as outline above with Keith Wagner and Keith Wagner also provided collateral history as part of today's encounter. This note will be shared with the patient's primary care provider to coordinate care.  Keith Wagner will continue to follow his primary care provider for management of his other comorbid conditions.   His questions and concerns were addressed to his satisfaction. He voices understanding of the recommendations provided  during this encounter.    Signed, Madonna Michele HAS, [facility]  A Division of [facility] [address], [place], [place] [address]  10:35 AM 03/02/24

## 2024-03-03 ENCOUNTER — Encounter: Payer: Self-pay | Admitting: Cardiology

## 2024-03-03 LAB — BASIC METABOLIC PANEL WITH GFR
BUN/Creatinine Ratio: 11 (ref 10–24)
BUN: 13 mg/dL (ref 8–27)
CO2: 20 mmol/L (ref 20–29)
Calcium: 8.9 mg/dL (ref 8.6–10.2)
Chloride: 104 mmol/L (ref 96–106)
Creatinine, Ser: 1.14 mg/dL (ref 0.76–1.27)
Glucose: 94 mg/dL (ref 70–99)
Potassium: 3.9 mmol/L (ref 3.5–5.2)
Sodium: 139 mmol/L (ref 134–144)
eGFR: 64 mL/min/1.73 (ref >59)

## 2024-03-03 LAB — PRO B NATRIURETIC PEPTIDE: NT-Pro BNP: 1641 pg/mL — AB (ref 0–486)

## 2024-03-03 LAB — MAGNESIUM: Magnesium: 2.2 mg/dL (ref 1.6–2.3)

## 2024-03-04 ENCOUNTER — Ambulatory Visit: Payer: Self-pay | Admitting: Cardiology

## 2024-03-04 NOTE — Telephone Encounter (Signed)
 I spoke to his niece over the phone.  Plan is ... Hold spironolactone , as he is currently dealing. Continue potassium supplementation Restart Lasix  Day 1 and day 2: Lasix  20 mg p.o. twice daily Starting day 3: Lasix  20 mg p.o. daily Repeat blood work in 1 week: Check NT proBNP and BMP, please have it available on 03/11/2024.  Patient is gaining weight and short of breath, will focus on diuresis for now.  Goal is to get back to weight of 165 pounds as long as renal function and electrolytes remained stable.  Dr. Analise Glotfelty

## 2024-03-05 ENCOUNTER — Other Ambulatory Visit: Payer: Self-pay

## 2024-03-05 DIAGNOSIS — I10 Essential (primary) hypertension: Secondary | ICD-10-CM

## 2024-03-10 ENCOUNTER — Ambulatory Visit: Payer: Self-pay | Admitting: Cardiology

## 2024-03-10 LAB — BASIC METABOLIC PANEL WITH GFR
BUN/Creatinine Ratio: 12 (ref 10–24)
BUN: 14 mg/dL (ref 8–27)
CO2: 21 mmol/L (ref 20–29)
Calcium: 9.4 mg/dL (ref 8.6–10.2)
Chloride: 101 mmol/L (ref 96–106)
Creatinine, Ser: 1.19 mg/dL (ref 0.76–1.27)
Glucose: 107 mg/dL — ABNORMAL HIGH (ref 70–99)
Potassium: 3.8 mmol/L (ref 3.5–5.2)
Sodium: 139 mmol/L (ref 134–144)
eGFR: 61 mL/min/1.73 (ref >59)

## 2024-03-10 LAB — PRO B NATRIURETIC PEPTIDE: NT-Pro BNP: 490 pg/mL — ABNORMAL HIGH (ref 0–486)

## 2024-03-10 LAB — HEMOGLOBIN AND HEMATOCRIT, BLOOD
Hematocrit: 48.7 % (ref 37.5–51.0)
Hemoglobin: 16.1 g/dL (ref 13.0–17.7)

## 2024-03-21 ENCOUNTER — Encounter: Payer: Self-pay | Admitting: Cardiology

## 2024-03-21 DIAGNOSIS — I10 Essential (primary) hypertension: Secondary | ICD-10-CM

## 2024-03-21 DIAGNOSIS — I5020 Unspecified systolic (congestive) heart failure: Secondary | ICD-10-CM

## 2024-03-24 ENCOUNTER — Other Ambulatory Visit: Payer: Self-pay | Admitting: Cardiology

## 2024-03-24 LAB — BASIC METABOLIC PANEL WITH GFR
BUN/Creatinine Ratio: 18 (ref 10–24)
BUN: 25 mg/dL (ref 8–27)
CO2: 16 mmol/L — ABNORMAL LOW (ref 20–29)
Calcium: 9.9 mg/dL (ref 8.6–10.2)
Chloride: 103 mmol/L (ref 96–106)
Creatinine, Ser: 1.39 mg/dL — ABNORMAL HIGH (ref 0.76–1.27)
Glucose: 96 mg/dL (ref 70–99)
Potassium: 5.2 mmol/L (ref 3.5–5.2)
Sodium: 139 mmol/L (ref 134–144)
eGFR: 51 mL/min/1.73 — ABNORMAL LOW

## 2024-03-26 ENCOUNTER — Ambulatory Visit: Payer: Self-pay | Admitting: Cardiology

## 2024-03-29 NOTE — Telephone Encounter (Signed)
 Great question.  Continue Aldactone  25 mg po qday.  USE lasix  and potassium on as needed basis - I.e. when he uses lasix  have him take his potassium tabs.  Continue to check his weights as home.   Dr. Hurley Blevins

## 2024-04-16 NOTE — Telephone Encounter (Signed)
 Spoke with patients daughter per DPR. Relayed Dr. Tyree note. Confirmed dosage and script information of BP medications. Reviewed BP readings sent via MyChart with Dr. Tolia as well as patient. Confirmed with patient that Dr. Michele was ok with readings. Encouraged to keep follow up appointments with Pharm-D in February and with Dr. Michele in March to evaluate BP medication titration. All other concerns addressed.

## 2024-04-20 NOTE — Telephone Encounter (Signed)
 Sending  it back to you.   Dr. Brinleigh Tew
